# Patient Record
Sex: Male | Born: 1970 | Race: White | Hispanic: No | Marital: Married | Smoking: Former smoker
Health system: Southern US, Community
[De-identification: ages and names within clinical notes are randomized; demographics above are authoritative.]

## PROBLEM LIST (undated history)

## (undated) DIAGNOSIS — R519 Headache, unspecified: Secondary | ICD-10-CM

## (undated) DIAGNOSIS — E039 Hypothyroidism, unspecified: Secondary | ICD-10-CM

## (undated) DIAGNOSIS — F3181 Bipolar II disorder: Secondary | ICD-10-CM

## (undated) DIAGNOSIS — IMO0002 Reserved for concepts with insufficient information to code with codable children: Secondary | ICD-10-CM

## (undated) DIAGNOSIS — I10 Essential (primary) hypertension: Secondary | ICD-10-CM

## (undated) DIAGNOSIS — F32A Depression, unspecified: Secondary | ICD-10-CM

## (undated) DIAGNOSIS — F4310 Post-traumatic stress disorder, unspecified: Secondary | ICD-10-CM

## (undated) DIAGNOSIS — G8929 Other chronic pain: Secondary | ICD-10-CM

## (undated) DIAGNOSIS — G709 Myoneural disorder, unspecified: Secondary | ICD-10-CM

## (undated) DIAGNOSIS — E785 Hyperlipidemia, unspecified: Secondary | ICD-10-CM

## (undated) DIAGNOSIS — S82209A Unspecified fracture of shaft of unspecified tibia, initial encounter for closed fracture: Secondary | ICD-10-CM

## (undated) DIAGNOSIS — S02609A Fracture of mandible, unspecified, initial encounter for closed fracture: Secondary | ICD-10-CM

## (undated) DIAGNOSIS — T8859XA Other complications of anesthesia, initial encounter: Secondary | ICD-10-CM

## (undated) DIAGNOSIS — F419 Anxiety disorder, unspecified: Secondary | ICD-10-CM

## (undated) DIAGNOSIS — G894 Chronic pain syndrome: Secondary | ICD-10-CM

## (undated) DIAGNOSIS — M549 Dorsalgia, unspecified: Secondary | ICD-10-CM

## (undated) DIAGNOSIS — R4589 Other symptoms and signs involving emotional state: Secondary | ICD-10-CM

## (undated) DIAGNOSIS — F329 Major depressive disorder, single episode, unspecified: Secondary | ICD-10-CM

## (undated) HISTORY — DX: Reserved for concepts with insufficient information to code with codable children: IMO0002

## (undated) HISTORY — DX: Depression, unspecified: F32.A

## (undated) HISTORY — PX: MANDIBLE FRACTURE SURGERY: SHX706

## (undated) HISTORY — PX: BACK SURGERY: SHX140

## (undated) HISTORY — DX: Post-traumatic stress disorder, unspecified: F43.10

## (undated) HISTORY — DX: Dorsalgia, unspecified: M54.9

## (undated) HISTORY — DX: Essential (primary) hypertension: I10

## (undated) HISTORY — DX: Other chronic pain: G89.29

## (undated) HISTORY — DX: Anxiety disorder, unspecified: F41.9

## (undated) HISTORY — DX: Unspecified fracture of shaft of unspecified tibia, initial encounter for closed fracture: S82.209A

## (undated) HISTORY — DX: Major depressive disorder, single episode, unspecified: F32.9

## (undated) HISTORY — DX: Hyperlipidemia, unspecified: E78.5

## (undated) HISTORY — DX: Chronic pain syndrome: G89.4

## (undated) HISTORY — PX: APPENDECTOMY: SHX54

## (undated) HISTORY — DX: Hypothyroidism, unspecified: E03.9

## (undated) HISTORY — PX: FRACTURE SURGERY: SHX138

## (undated) HISTORY — DX: Fracture of mandible, unspecified, initial encounter for closed fracture: S02.609A

## (undated) HISTORY — PX: THORACIC DISCECTOMY: SHX100

## (undated) HISTORY — DX: Bipolar II disorder: F31.81

---

## 1998-01-31 ENCOUNTER — Ambulatory Visit (HOSPITAL_COMMUNITY): Admission: RE | Admit: 1998-01-31 | Discharge: 1998-01-31 | Payer: Self-pay | Admitting: *Deleted

## 1998-02-22 ENCOUNTER — Ambulatory Visit (HOSPITAL_COMMUNITY): Admission: RE | Admit: 1998-02-22 | Discharge: 1998-02-23 | Payer: Self-pay | Admitting: *Deleted

## 1998-03-08 ENCOUNTER — Ambulatory Visit (HOSPITAL_COMMUNITY): Admission: RE | Admit: 1998-03-08 | Discharge: 1998-03-08 | Payer: Self-pay | Admitting: *Deleted

## 1999-06-13 ENCOUNTER — Ambulatory Visit (HOSPITAL_COMMUNITY): Admission: RE | Admit: 1999-06-13 | Discharge: 1999-06-13 | Payer: Self-pay | Admitting: Neurosurgery

## 1999-06-13 ENCOUNTER — Encounter: Payer: Self-pay | Admitting: Neurosurgery

## 2000-03-17 ENCOUNTER — Inpatient Hospital Stay (HOSPITAL_COMMUNITY): Admission: EM | Admit: 2000-03-17 | Discharge: 2000-03-22 | Payer: Self-pay | Admitting: *Deleted

## 2000-03-25 ENCOUNTER — Other Ambulatory Visit: Admission: RE | Admit: 2000-03-25 | Discharge: 2000-04-02 | Payer: Self-pay

## 2000-03-30 ENCOUNTER — Encounter: Payer: Self-pay | Admitting: Emergency Medicine

## 2000-03-30 ENCOUNTER — Emergency Department (HOSPITAL_COMMUNITY): Admission: EM | Admit: 2000-03-30 | Discharge: 2000-03-30 | Payer: Self-pay | Admitting: Emergency Medicine

## 2000-06-17 ENCOUNTER — Encounter: Payer: Self-pay | Admitting: Emergency Medicine

## 2000-06-17 ENCOUNTER — Emergency Department (HOSPITAL_COMMUNITY): Admission: EM | Admit: 2000-06-17 | Discharge: 2000-06-17 | Payer: Self-pay | Admitting: Internal Medicine

## 2000-06-18 ENCOUNTER — Emergency Department (HOSPITAL_COMMUNITY): Admission: EM | Admit: 2000-06-18 | Discharge: 2000-06-18 | Payer: Self-pay | Admitting: Emergency Medicine

## 2000-06-22 ENCOUNTER — Emergency Department (HOSPITAL_COMMUNITY): Admission: EM | Admit: 2000-06-22 | Discharge: 2000-06-22 | Payer: Self-pay | Admitting: Emergency Medicine

## 2000-07-09 ENCOUNTER — Emergency Department (HOSPITAL_COMMUNITY): Admission: EM | Admit: 2000-07-09 | Discharge: 2000-07-09 | Payer: Self-pay | Admitting: Emergency Medicine

## 2000-07-10 ENCOUNTER — Inpatient Hospital Stay (HOSPITAL_COMMUNITY): Admission: EM | Admit: 2000-07-10 | Discharge: 2000-07-16 | Payer: Self-pay | Admitting: Psychiatry

## 2002-05-31 ENCOUNTER — Encounter: Payer: Self-pay | Admitting: Internal Medicine

## 2002-05-31 ENCOUNTER — Encounter: Admission: RE | Admit: 2002-05-31 | Discharge: 2002-05-31 | Payer: Self-pay | Admitting: Internal Medicine

## 2002-08-10 HISTORY — PX: LAMINECTOMY: SHX219

## 2002-08-26 ENCOUNTER — Emergency Department (HOSPITAL_COMMUNITY): Admission: EM | Admit: 2002-08-26 | Discharge: 2002-08-26 | Payer: Self-pay | Admitting: Emergency Medicine

## 2002-08-26 ENCOUNTER — Encounter: Payer: Self-pay | Admitting: Emergency Medicine

## 2002-11-11 ENCOUNTER — Encounter: Payer: Self-pay | Admitting: Emergency Medicine

## 2002-11-11 ENCOUNTER — Emergency Department (HOSPITAL_COMMUNITY): Admission: EM | Admit: 2002-11-11 | Discharge: 2002-11-11 | Payer: Self-pay | Admitting: Emergency Medicine

## 2003-01-23 ENCOUNTER — Encounter: Payer: Self-pay | Admitting: Internal Medicine

## 2003-01-23 ENCOUNTER — Encounter: Admission: RE | Admit: 2003-01-23 | Discharge: 2003-01-23 | Payer: Self-pay | Admitting: Internal Medicine

## 2003-03-27 ENCOUNTER — Inpatient Hospital Stay (HOSPITAL_COMMUNITY): Admission: RE | Admit: 2003-03-27 | Discharge: 2003-03-30 | Payer: Self-pay | Admitting: Neurosurgery

## 2003-03-27 ENCOUNTER — Encounter: Payer: Self-pay | Admitting: Neurosurgery

## 2003-06-11 ENCOUNTER — Emergency Department (HOSPITAL_COMMUNITY): Admission: EM | Admit: 2003-06-11 | Discharge: 2003-06-12 | Payer: Self-pay | Admitting: Emergency Medicine

## 2003-08-19 ENCOUNTER — Emergency Department (HOSPITAL_COMMUNITY): Admission: EM | Admit: 2003-08-19 | Discharge: 2003-08-19 | Payer: Self-pay | Admitting: Emergency Medicine

## 2003-12-08 ENCOUNTER — Emergency Department (HOSPITAL_COMMUNITY): Admission: EM | Admit: 2003-12-08 | Discharge: 2003-12-08 | Payer: Self-pay | Admitting: Emergency Medicine

## 2003-12-20 ENCOUNTER — Emergency Department (HOSPITAL_COMMUNITY): Admission: EM | Admit: 2003-12-20 | Discharge: 2003-12-20 | Payer: Self-pay | Admitting: Nurse Practitioner

## 2003-12-21 ENCOUNTER — Inpatient Hospital Stay (HOSPITAL_COMMUNITY): Admission: RE | Admit: 2003-12-21 | Discharge: 2003-12-26 | Payer: Self-pay | Admitting: Psychiatry

## 2004-03-19 ENCOUNTER — Encounter: Payer: Self-pay | Admitting: Internal Medicine

## 2004-06-09 ENCOUNTER — Emergency Department (HOSPITAL_COMMUNITY): Admission: EM | Admit: 2004-06-09 | Discharge: 2004-06-09 | Payer: Self-pay | Admitting: Emergency Medicine

## 2004-06-17 ENCOUNTER — Emergency Department (HOSPITAL_COMMUNITY): Admission: EM | Admit: 2004-06-17 | Discharge: 2004-06-17 | Payer: Self-pay | Admitting: Emergency Medicine

## 2004-06-20 ENCOUNTER — Ambulatory Visit: Payer: Self-pay | Admitting: Internal Medicine

## 2004-06-23 ENCOUNTER — Emergency Department (HOSPITAL_COMMUNITY): Admission: EM | Admit: 2004-06-23 | Discharge: 2004-06-24 | Payer: Self-pay | Admitting: Emergency Medicine

## 2004-06-26 ENCOUNTER — Ambulatory Visit: Payer: Self-pay | Admitting: Gastroenterology

## 2004-06-26 ENCOUNTER — Ambulatory Visit: Payer: Self-pay | Admitting: Internal Medicine

## 2004-06-26 ENCOUNTER — Inpatient Hospital Stay (HOSPITAL_COMMUNITY): Admission: AD | Admit: 2004-06-26 | Discharge: 2004-06-29 | Payer: Self-pay | Admitting: Internal Medicine

## 2004-06-27 ENCOUNTER — Ambulatory Visit: Payer: Self-pay | Admitting: Endocrinology

## 2004-06-27 ENCOUNTER — Ambulatory Visit: Payer: Self-pay | Admitting: Internal Medicine

## 2004-07-13 ENCOUNTER — Emergency Department (HOSPITAL_COMMUNITY): Admission: EM | Admit: 2004-07-13 | Discharge: 2004-07-13 | Payer: Self-pay | Admitting: Emergency Medicine

## 2004-07-14 ENCOUNTER — Ambulatory Visit: Payer: Self-pay | Admitting: Internal Medicine

## 2004-08-10 HISTORY — PX: UPPER GASTROINTESTINAL ENDOSCOPY: SHX188

## 2004-12-15 ENCOUNTER — Ambulatory Visit: Payer: Self-pay | Admitting: Internal Medicine

## 2005-06-05 ENCOUNTER — Ambulatory Visit: Payer: Self-pay | Admitting: Internal Medicine

## 2005-08-10 HISTORY — PX: INTRATHECAL PUMP IMPLANTATION: SHX1844

## 2005-12-12 ENCOUNTER — Ambulatory Visit: Payer: Self-pay | Admitting: Internal Medicine

## 2005-12-13 ENCOUNTER — Observation Stay (HOSPITAL_COMMUNITY): Admission: EM | Admit: 2005-12-13 | Discharge: 2005-12-14 | Payer: Self-pay | Admitting: Emergency Medicine

## 2005-12-23 ENCOUNTER — Ambulatory Visit: Payer: Self-pay | Admitting: Internal Medicine

## 2006-09-02 ENCOUNTER — Ambulatory Visit: Payer: Self-pay | Admitting: Internal Medicine

## 2006-10-19 ENCOUNTER — Ambulatory Visit: Payer: Self-pay | Admitting: Internal Medicine

## 2006-11-29 ENCOUNTER — Ambulatory Visit: Payer: Self-pay | Admitting: Internal Medicine

## 2006-11-29 LAB — CONVERTED CEMR LAB
ALT: 41 units/L — ABNORMAL HIGH (ref 0–40)
AST: 31 units/L (ref 0–37)
BUN: 7 mg/dL (ref 6–23)
Basophils Absolute: 0 10*3/uL (ref 0.0–0.1)
Basophils Relative: 0.3 % (ref 0.0–1.0)
CO2: 30 meq/L (ref 19–32)
Calcium: 9.3 mg/dL (ref 8.4–10.5)
Chloride: 106 meq/L (ref 96–112)
Creatinine, Ser: 1 mg/dL (ref 0.4–1.5)
Eosinophils Absolute: 0.3 10*3/uL (ref 0.0–0.6)
Eosinophils Relative: 3.9 % (ref 0.0–5.0)
GFR calc Af Amer: 110 mL/min
GFR calc non Af Amer: 91 mL/min
Glucose, Bld: 118 mg/dL — ABNORMAL HIGH (ref 70–99)
HCT: 41.2 % (ref 39.0–52.0)
Hemoglobin: 14.3 g/dL (ref 13.0–17.0)
Lymphocytes Relative: 29.2 % (ref 12.0–46.0)
MCHC: 34.6 g/dL (ref 30.0–36.0)
MCV: 92.8 fL (ref 78.0–100.0)
Monocytes Absolute: 0.2 10*3/uL (ref 0.2–0.7)
Monocytes Relative: 2.8 % — ABNORMAL LOW (ref 3.0–11.0)
Neutro Abs: 4.9 10*3/uL (ref 1.4–7.7)
Neutrophils Relative %: 63.8 % (ref 43.0–77.0)
Platelets: 351 10*3/uL (ref 150–400)
Potassium: 4.1 meq/L (ref 3.5–5.1)
RBC: 4.44 M/uL (ref 4.22–5.81)
RDW: 10.7 % — ABNORMAL LOW (ref 11.5–14.6)
Sodium: 142 meq/L (ref 135–145)
WBC: 7.6 10*3/uL (ref 4.5–10.5)

## 2007-02-08 ENCOUNTER — Telehealth (INDEPENDENT_AMBULATORY_CARE_PROVIDER_SITE_OTHER): Payer: Self-pay | Admitting: *Deleted

## 2007-03-17 ENCOUNTER — Ambulatory Visit: Payer: Self-pay | Admitting: Internal Medicine

## 2007-04-07 ENCOUNTER — Ambulatory Visit: Payer: Self-pay | Admitting: Internal Medicine

## 2007-04-07 DIAGNOSIS — E039 Hypothyroidism, unspecified: Secondary | ICD-10-CM | POA: Insufficient documentation

## 2007-04-07 DIAGNOSIS — G894 Chronic pain syndrome: Secondary | ICD-10-CM | POA: Insufficient documentation

## 2007-05-11 DIAGNOSIS — E039 Hypothyroidism, unspecified: Secondary | ICD-10-CM

## 2007-05-11 HISTORY — DX: Hypothyroidism, unspecified: E03.9

## 2007-06-21 ENCOUNTER — Ambulatory Visit: Payer: Self-pay | Admitting: Internal Medicine

## 2007-06-24 ENCOUNTER — Encounter (INDEPENDENT_AMBULATORY_CARE_PROVIDER_SITE_OTHER): Payer: Self-pay | Admitting: *Deleted

## 2007-06-24 LAB — CONVERTED CEMR LAB
ALT: 27 units/L (ref 0–53)
AST: 26 units/L (ref 0–37)
Albumin: 4.7 g/dL (ref 3.5–5.2)
Alkaline Phosphatase: 89 units/L (ref 39–117)
BUN: 5 mg/dL — ABNORMAL LOW (ref 6–23)
Basophils Absolute: 0.1 10*3/uL (ref 0.0–0.1)
Basophils Relative: 0.5 % (ref 0.0–1.0)
Bilirubin, Direct: 0.1 mg/dL (ref 0.0–0.3)
CO2: 28 meq/L (ref 19–32)
Calcium: 9.9 mg/dL (ref 8.4–10.5)
Chloride: 101 meq/L (ref 96–112)
Creatinine, Ser: 1.1 mg/dL (ref 0.4–1.5)
Eosinophils Absolute: 0.2 10*3/uL (ref 0.0–0.6)
Eosinophils Relative: 2.1 % (ref 0.0–5.0)
GFR calc Af Amer: 98 mL/min
GFR calc non Af Amer: 81 mL/min
Glucose, Bld: 102 mg/dL — ABNORMAL HIGH (ref 70–99)
HCT: 43.9 % (ref 39.0–52.0)
Hemoglobin: 15 g/dL (ref 13.0–17.0)
Lymphocytes Relative: 23 % (ref 12.0–46.0)
MCHC: 34.2 g/dL (ref 30.0–36.0)
MCV: 97.1 fL (ref 78.0–100.0)
Monocytes Absolute: 0.5 10*3/uL (ref 0.2–0.7)
Monocytes Relative: 4.4 % (ref 3.0–11.0)
Neutro Abs: 7.1 10*3/uL (ref 1.4–7.7)
Neutrophils Relative %: 70 % (ref 43.0–77.0)
Platelets: 297 10*3/uL (ref 150–400)
Potassium: 3.8 meq/L (ref 3.5–5.1)
RBC: 4.51 M/uL (ref 4.22–5.81)
RDW: 11 % — ABNORMAL LOW (ref 11.5–14.6)
Sodium: 138 meq/L (ref 135–145)
Total Bilirubin: 0.7 mg/dL (ref 0.3–1.2)
Total Protein: 7.8 g/dL (ref 6.0–8.3)
WBC: 10.3 10*3/uL (ref 4.5–10.5)

## 2007-10-17 ENCOUNTER — Telehealth (INDEPENDENT_AMBULATORY_CARE_PROVIDER_SITE_OTHER): Payer: Self-pay | Admitting: *Deleted

## 2007-11-02 ENCOUNTER — Ambulatory Visit: Payer: Self-pay | Admitting: Internal Medicine

## 2008-02-01 ENCOUNTER — Ambulatory Visit: Payer: Self-pay | Admitting: Internal Medicine

## 2008-05-24 ENCOUNTER — Telehealth (INDEPENDENT_AMBULATORY_CARE_PROVIDER_SITE_OTHER): Payer: Self-pay | Admitting: *Deleted

## 2008-06-19 ENCOUNTER — Telehealth (INDEPENDENT_AMBULATORY_CARE_PROVIDER_SITE_OTHER): Payer: Self-pay | Admitting: *Deleted

## 2009-01-29 ENCOUNTER — Emergency Department (HOSPITAL_COMMUNITY): Admission: EM | Admit: 2009-01-29 | Discharge: 2009-01-29 | Payer: Self-pay | Admitting: Emergency Medicine

## 2009-03-10 DIAGNOSIS — F3181 Bipolar II disorder: Secondary | ICD-10-CM

## 2009-03-10 HISTORY — DX: Bipolar II disorder: F31.81

## 2009-04-01 ENCOUNTER — Ambulatory Visit: Payer: Self-pay | Admitting: Internal Medicine

## 2009-04-01 DIAGNOSIS — R413 Other amnesia: Secondary | ICD-10-CM | POA: Insufficient documentation

## 2009-04-01 DIAGNOSIS — F319 Bipolar disorder, unspecified: Secondary | ICD-10-CM | POA: Insufficient documentation

## 2009-04-01 DIAGNOSIS — E785 Hyperlipidemia, unspecified: Secondary | ICD-10-CM | POA: Insufficient documentation

## 2009-04-01 DIAGNOSIS — R7309 Other abnormal glucose: Secondary | ICD-10-CM | POA: Insufficient documentation

## 2009-04-01 DIAGNOSIS — F411 Generalized anxiety disorder: Secondary | ICD-10-CM | POA: Insufficient documentation

## 2009-04-01 DIAGNOSIS — R9431 Abnormal electrocardiogram [ECG] [EKG]: Secondary | ICD-10-CM | POA: Insufficient documentation

## 2009-04-02 ENCOUNTER — Ambulatory Visit: Payer: Self-pay | Admitting: Internal Medicine

## 2009-04-05 ENCOUNTER — Encounter (INDEPENDENT_AMBULATORY_CARE_PROVIDER_SITE_OTHER): Payer: Self-pay | Admitting: *Deleted

## 2009-04-17 ENCOUNTER — Encounter (INDEPENDENT_AMBULATORY_CARE_PROVIDER_SITE_OTHER): Payer: Self-pay | Admitting: *Deleted

## 2009-04-29 ENCOUNTER — Ambulatory Visit: Payer: Self-pay | Admitting: Cardiology

## 2009-04-29 DIAGNOSIS — R0602 Shortness of breath: Secondary | ICD-10-CM | POA: Insufficient documentation

## 2009-05-14 ENCOUNTER — Encounter: Payer: Self-pay | Admitting: Cardiology

## 2009-05-14 ENCOUNTER — Ambulatory Visit: Payer: Self-pay

## 2009-05-14 ENCOUNTER — Ambulatory Visit: Payer: Self-pay | Admitting: Cardiology

## 2009-05-14 ENCOUNTER — Ambulatory Visit (HOSPITAL_COMMUNITY): Admission: RE | Admit: 2009-05-14 | Discharge: 2009-05-14 | Payer: Self-pay | Admitting: Cardiology

## 2009-06-14 ENCOUNTER — Ambulatory Visit: Payer: Self-pay | Admitting: Cardiology

## 2009-07-24 ENCOUNTER — Telehealth: Payer: Self-pay | Admitting: Cardiology

## 2009-07-30 ENCOUNTER — Telehealth: Payer: Self-pay | Admitting: Cardiology

## 2010-01-03 ENCOUNTER — Emergency Department (HOSPITAL_COMMUNITY): Admission: EM | Admit: 2010-01-03 | Discharge: 2010-01-04 | Payer: Self-pay | Admitting: Emergency Medicine

## 2010-04-05 ENCOUNTER — Emergency Department (HOSPITAL_COMMUNITY): Admission: EM | Admit: 2010-04-05 | Discharge: 2010-04-05 | Payer: Self-pay | Admitting: Emergency Medicine

## 2010-04-28 ENCOUNTER — Encounter: Payer: Self-pay | Admitting: Internal Medicine

## 2010-04-29 ENCOUNTER — Telehealth (INDEPENDENT_AMBULATORY_CARE_PROVIDER_SITE_OTHER): Payer: Self-pay | Admitting: *Deleted

## 2010-05-01 ENCOUNTER — Ambulatory Visit: Payer: Self-pay | Admitting: Internal Medicine

## 2010-05-01 DIAGNOSIS — R079 Chest pain, unspecified: Secondary | ICD-10-CM | POA: Insufficient documentation

## 2010-05-01 DIAGNOSIS — E559 Vitamin D deficiency, unspecified: Secondary | ICD-10-CM | POA: Insufficient documentation

## 2010-05-01 DIAGNOSIS — F17200 Nicotine dependence, unspecified, uncomplicated: Secondary | ICD-10-CM | POA: Insufficient documentation

## 2010-05-13 ENCOUNTER — Ambulatory Visit: Payer: Self-pay | Admitting: Internal Medicine

## 2010-09-07 LAB — CONVERTED CEMR LAB
Basophils Relative: 0 % (ref 0.0–3.0)
CO2: 33 meq/L — ABNORMAL HIGH (ref 19–32)
Calcium: 9 mg/dL (ref 8.4–10.5)
Direct LDL: 95.7 mg/dL
Eosinophils Absolute: 0.4 10*3/uL (ref 0.0–0.7)
Glucose, Bld: 93 mg/dL (ref 70–99)
Lithium Lvl: 0.4 meq/L — ABNORMAL LOW (ref 0.80–1.40)
Lymphs Abs: 3.4 10*3/uL (ref 0.7–4.0)
MCHC: 33.9 g/dL (ref 30.0–36.0)
MCV: 98.4 fL (ref 78.0–100.0)
Monocytes Absolute: 0.3 10*3/uL (ref 0.1–1.0)
Neutro Abs: 4.2 10*3/uL (ref 1.4–7.7)
Neutrophils Relative %: 50.9 % (ref 43.0–77.0)
RBC: 4.26 M/uL (ref 4.22–5.81)
Sodium: 143 meq/L (ref 135–145)

## 2010-09-09 NOTE — Progress Notes (Signed)
Summary: LAB RESULTS FROM ANOTHER OFFICE  Phone Note Call from Patient Call back at [phone] HEATHER=WIFE   Caller: Spouse Summary of Call: PATIENT PSYCHIATRIST'S  OFFICE DID BLOOD WORK AND IT SHOWED THAT HIS VIT D LEVEL IS LOW--THAT OFFICE WILL BE FAXING RESULTS TO [place]--  PATIENT'S WIFE CALLED--DIDNT KNOW WHETHER TO GET APPOINTMENT TO DISCUSS LOW VIT D LEVEL OR WAIT FOR DR HOPPER TO REVIEW LABS FROM OTHER OFFICE--ADVISED HER TO WAIT FOR REVIEW OF BLOOD WORK  [facility] [address], [place] Initial call taken by: Jerolyn Shin,  April 29, 2010 9:46 AM  Follow-up for Phone Call        This patient will need an OV to address labs order by another Doctor./Chrae [facility] CMA  April 29, 2010 9:58 AM   Additional Follow-up for Phone Call Additional follow up Details #1::        APPT IS FOR THURSDAY 9/22 Additional Follow-up by: Jerolyn Shin,  April 30, 2010 3:09 PM

## 2010-09-09 NOTE — Assessment & Plan Note (Signed)
Summary: LOW VIT D PER BLOOD WORK FROM PSYCHIATRIST OFFICE-ASKED ///SP[facility]   Vital Signs:  Patient profile:   40 year old male Weight:      186.8 pounds BMI:     31.20 Temp:     98.8 degrees F oral Pulse rate:   60 / minute Resp:     15 per minute BP sitting:   114 / 70  (left arm) Cuff size:   large  Vitals Entered By: Shonna Chock CMA (May 01, 2010 3:15 PM) CC: Follow-up visit: Discuss labs    Primary Care Provider:  Marga Melnick MD  CC:  Follow-up visit: Discuss labs .  History of Present Illness: Dental fragility noted 6 months ago; Dr Shea Evans noted thinning of maxilla requiring bone grafting. Dr Nolen Mu astutely documented vitamin D defifiency ; level was 21.4 04/28/2010. TSH  was 7.900; no doses missed .Synthroid has been taken with other meds.       He also  presents with Chest pain intermittently since 07/2009, but progressive in past few months.  The patient reports resting chest pain, but denies nausea, vomiting, diaphoresis, shortness of breath, palpitations, and indigestion.  The pain is described as intermittent and dull- aching.  The pain is located in the left lower chest.  The pain radiates to the back occasionally.  Episodes of chest pain last  hours.  The pain has no triggers or relievers. He smokes 3-4  cigarettes X 3-4 months, previously 1 ppd.  Allergies: 1)  ! Vancomycin 2)  ! Pcn 3)  ! * Neurotin 4)  * Remeron  Review of Systems General:  Denies weight loss; Weight up 20#. Eyes:  Complains of blurring; denies double vision and vision loss-both eyes. [employer]:  Complains of hoarseness; denies difficulty swallowing. Resp:  Complains of sputum productive; denies chest pain with inspiration, cough, and coughing up blood; Occasional  "black  sputum". GI:  Denies bloody stools, constipation, dark tarry stools, and diarrhea. Derm:  Denies changes in nail beds, dryness, and hair loss. Endo:  Complains of heat intolerance; denies cold intolerance.  Physical  Exam  General:  in no acute distress; cooperative throughout examination Eyes:  No corneal or conjunctival inflammation noted.  No icterus Neck:  No deformities, masses, or tenderness noted. Thyroid full Lungs:  Normal respiratory effort, chest expands symmetrically. Lungs are clear to auscultation, no crackles or wheezes. Heart:  regular rhythm, no murmur, no gallop, no rub, and bradycardia.   Abdomen:  Bowel sounds positive,abdomen soft and non-tender without masses, organomegaly or hernias noted.Pump subcutaneously R abdomen Neurologic:   oriented X3 and DTRs symmetrical and normal. No tremor  Skin:  Intact without suspicious lesions or rashes Cervical Nodes:  No lymphadenopathy noted Axillary Nodes:  No palpable lymphadenopathy Psych:  normally interactive, good eye contact, and not anxious appearing.     Impression & Recommendations:  Problem # 1:  VITAMIN D DEFICIENCY (ICD-268.9)  Problem # 2:  HYPOTHYROIDISM (ICD-244.9)  Problem # 3:  CHEST PAIN (ICD-786.50)  Orders: EKG w/ Interpretation (93000): SIGNIFICANT improvement in diffuse T wave inversions compared to 04/29/2009 T-2 View CXR (71020TC)  Problem # 4:  CIGARETTE SMOKER (ICD-305.1)  Orders: T-2 View CXR (71020TC)  Complete Medication List: 1)  Synthroid 75 Mcg Tabs (levothyroxine Sodium)  .Marland Kitchen.. 1 once daily 2)  Lithium Carbonate 300 Mg Tabs (Lithium carbonate) .Marland Kitchen.. 1 tab three times a day 3)  Lamictal 200 Mg Tabs (Lamotrigine) .... Takes 500mg  qd 4)  Hydromorphine Pump  5)  Propranolol  Hcl 20 Mg Tabs (Propranolol hcl) .... Take one tablet once daily 6)  Promethazine Hcl 25 Mg Supp (Promethazine hcl) .Marland Kitchen.. 1 by rectum as needed n&v 7)  Celexa 20 Mg Tabs (Citalopram hydrobromide) .Marland Kitchen.. 1 by mouth once daily 8)  Lovaza 1 Gm Caps (Omega-3-acid ethyl esters) .... Take 2 daily 9)  Temazepam-? Dose  .Marland KitchenMarland Kitchen. 1 by mouth as needed 10)  Valium 5 Mg Tabs (Diazepam) .Marland Kitchen.. 1 by mouth once daily as needed 11)  Maximum D3 10000 Unit  Caps (Cholecalciferol) .Marland Kitchen.. 1 pill m, w & fri  Other Orders: Admin 1st Vaccine (62952) Flu Vaccine 85yrs + (84132)  Patient Instructions: 1)  Please schedule a follow-up appointment in 9 weeks. 2)  TSH  (244.9) & vitamin D level (268.9) prior to visit. Prescriptions: MAXIMUM D3 10000 UNIT CAPS (CHOLECALCIFEROL) 1 pill M, W & Fri  #30 x 0   Entered and Authorized by:   Marga Melnick MD   Signed by:   Marga Melnick MD on 05/01/2010   Method used:   Print then Give to Patient   RxID:   [id number] SYNTHROID 75 MCG  TABS (LEVOTHYROXINE SODIUM) 1 once daily  #90 x 0   Entered and Authorized by:   Marga Melnick MD   Signed by:   Marga Melnick MD on 05/01/2010   Method used:   Print then Give to Patient   RxID:   [id number]  Flu Vaccine Consent Questions     Do you have a history of severe allergic reactions to this vaccine? no    Any prior history of allergic reactions to egg and/or gelatin? no    Do you have a sensitivity to the preservative Thimersol? no    Do you have a past history of Guillan-Barre Syndrome? no    Do you currently have an acute febrile illness? no    Have you ever had a severe reaction to latex? no    Vaccine information given and explained to patient? yes    Are you currently pregnant? no    Lot Number:[id number]   Exp Date:02/07/2011   Site Given  Left Deltoid IM  [phone] SYNTHROID 75 MCG  TABS (LEVOTHYROXINE SODIUM) 1 once daily  #90 x 0   Entered and Authorized by:   Marga Melnick MD   Signed by:   Marga Melnick MD on 05/01/2010   Method used:   Print then Give to Patient   RxID:   [id number]  .lbflu

## 2010-10-15 ENCOUNTER — Telehealth (INDEPENDENT_AMBULATORY_CARE_PROVIDER_SITE_OTHER): Payer: Self-pay | Admitting: *Deleted

## 2010-10-17 ENCOUNTER — Other Ambulatory Visit: Payer: Self-pay

## 2010-10-21 NOTE — Progress Notes (Signed)
Summary: two 90 day refills  Phone Note Refill Request Call back at Home Phone [phone] Message from:  Patient's wife on October 15, 2010 12:06 PM  Refills Requested: Medication #1:  SYNTHROID 75 MCG  TABS (LEVOTHYROXINE SODIUM) 1 once daily **LABS DUE NOW**  Medication #2:  VITAMIN D3 5000 UNIT CAPS TAKE 2 SOFTGELS EVERY MONDAY please call [phone] when prescriptions are ready for pickup---she mails them to her 90 day mail order facility  Initial call taken by: Jerolyn Shin,  October 15, 2010 12:42 PM  Follow-up for Phone Call        Left message on machine for patient to return call when avaliable, Reason for call:   Patient needs to schedule appointment for TSH & Vit D level, Per Dr.Hopper no 90 day supply until level checked Follow-up by: Shonna Chock CMA,  October 15, 2010 1:24 PM  Additional Follow-up for Phone Call Additional follow up Details #1::        pt came to pick up rx's. I told him he will have to have his labs done first. Pt didnt have time today to do them so he is coming back tomorrow to have labs done. Additional Follow-up by: Lavell Islam,  October 16, 2010 10:57 AM    Additional Follow-up for Phone Call Additional follow up Details #2::    Patient's wife called and indicate patient can have labs at her Job for no charge. Lab order placed at the front desk for pick-up./Chrae [facility] CMA  October 17, 2010 9:53 AM   New/Updated Medications: * LAB ORDER Non-fasting Labs:  Vit D level/TSH (268.9/244.9) Prescriptions: LAB ORDER Non-fasting Labs:  Vit D level/TSH (268.9/244.9)  #1 x 0   Entered by:   Shonna Chock CMA   Authorized by:   Marga Melnick MD   Signed by:   Shonna Chock CMA on 10/17/2010   Method used:   Print then Give to Patient   RxID:   [id number]

## 2010-10-27 LAB — POCT I-STAT, CHEM 8
BUN: 10 mg/dL (ref 6–23)
Calcium, Ion: 1.12 mmol/L (ref 1.12–1.32)
Chloride: 105 mEq/L (ref 96–112)
Glucose, Bld: 118 mg/dL — ABNORMAL HIGH (ref 70–99)
HCT: 46 % (ref 39.0–52.0)
Potassium: 4.3 mEq/L (ref 3.5–5.1)

## 2010-10-27 LAB — CBC
Hemoglobin: 15.2 g/dL (ref 13.0–17.0)
RBC: 4.66 MIL/uL (ref 4.22–5.81)
WBC: 8.6 10*3/uL (ref 4.0–10.5)

## 2010-10-27 LAB — DIFFERENTIAL
Lymphocytes Relative: 38 % (ref 12–46)
Monocytes Absolute: 0.4 10*3/uL (ref 0.1–1.0)
Monocytes Relative: 4 % (ref 3–12)
Neutro Abs: 4.7 10*3/uL (ref 1.7–7.7)
Neutrophils Relative %: 55 % (ref 43–77)

## 2010-11-18 ENCOUNTER — Telehealth: Payer: Self-pay

## 2010-11-18 NOTE — Telephone Encounter (Signed)
Dr.Hopper have you seen any labs from an outside source on this patient? If yes please advise if ok to give year supply of Synthroid

## 2010-11-18 NOTE — Telephone Encounter (Signed)
No I have not

## 2010-11-19 ENCOUNTER — Telehealth: Payer: Self-pay

## 2010-11-19 MED ORDER — LEVOTHYROXINE SODIUM 75 MCG PO TABS: 75.0000 ug | ORAL_TABLET | Freq: Every day | ORAL | Status: DC

## 2010-11-19 NOTE — Telephone Encounter (Signed)
He will need labs to assess his thyroid function( 244.9)

## 2010-11-19 NOTE — Telephone Encounter (Signed)
Patient called to say that he recently moved and that's why he didn't get labs done. Patient stated he will probably just get labs done here but he will call us back abouth 2ish to set up. Patient left his cell: [phone]

## 2010-11-19 NOTE — Telephone Encounter (Signed)
See other phone note, patient never had labs done

## 2010-11-19 NOTE — Telephone Encounter (Signed)
Patient called back and indicated he will come in tomorrow to get labs, Patient asked if Lithium level can be drawn as well,  per psych doctor. I ok'd and added Lithium level to lab orders (patient will bring hard copy of order from psych doctor). [facility] [place]

## 2010-11-20 ENCOUNTER — Other Ambulatory Visit: Payer: Self-pay

## 2010-12-24 ENCOUNTER — Encounter: Payer: Self-pay | Admitting: Internal Medicine

## 2010-12-24 ENCOUNTER — Telehealth: Payer: Self-pay | Admitting: Internal Medicine

## 2010-12-24 NOTE — Telephone Encounter (Signed)
Pt's wife [name] states that pt had his TSH checked at [facility] last week and the results were supposed to be sent over to Dr. Alwyn Ren. She also says it was normal. Pt is requesting for his Synth[facility] [place].Please advise.

## 2010-12-24 NOTE — Telephone Encounter (Signed)
Dr.Hopper have you seen lab results?

## 2010-12-24 NOTE — Telephone Encounter (Signed)
TSH was normal; PLEASE  verify last appt with me in [place](? 03/2009). I'll refill thyroid X 6 months , but  as per [facility] Medical Board standards  I cannot simply refill meds without actually seeing him @ least annually .

## 2010-12-25 MED ORDER — LEVOTHYROXINE SODIUM 75 MCG PO TABS: 75.0000 ug | ORAL_TABLET | Freq: Every day | ORAL | Status: DC

## 2010-12-25 NOTE — Telephone Encounter (Signed)
Last OV with Dr.Hopper was 05/01/2010 for f/u on labs done by psych Dr.   Patient's wife called again inquiring about refill, Maralyn Sago (scheduler) informed patient's wife rx to be sent in

## 2010-12-25 NOTE — Telephone Encounter (Signed)
Follow up would be necessary via September 2012

## 2010-12-26 NOTE — Discharge Summary (Signed)
NAME:  Cody [name], Cody [name]                 ACCOUNT NO.:  [id number]   MEDICAL RECORD NO.:  [id number]          PATIENT TYPE:  INP   LOCATION:  2028                         FACILITY:  [facility]   PHYSICIAN:  Sean A. Everardo All, M.D. [facility] OF BIRTH:  1971/07/19   DATE OF ADMISSION:  06/26/2004  DATE OF DISCHARGE:  06/29/2004                                 DISCHARGE SUMMARY   REASON FOR ADMISSION:  Nausea, vomiting, diarrhea.   HISTORY OF PRESENT ILLNESS:  Patient is a 40 year old admitted by Dr. Alwyn Ren  on June 26, 2004 with nausea, vomiting, diarrhea.  Please refer to Dr.  Frederik Pear dictated history and physical on that date for details.   HOSPITAL COURSE:  The patient was admitted and treated symptomatically.  He  underwent upper endoscopy by Dr. Arlyce Dice which was normal except for mild  gastritis.  This was felt to possibly be an incidental finding and perhaps  not related to his symptoms.   For his depression he was seen in consultation by psychiatry who recommended  increasing his Zoloft to 100 mg a day and this was done.   Patient has a chronic pain syndrome and states that he is between different  pain specialists.   By June 29, 2004 the patient was alert, oriented, ambulatory, eating his  usual diet and was thus discharged home.   DISCHARGE DIAGNOSES:  1.  Mild gastritis seen on endoscopy.  2.  Nausea, vomiting, and diarrhea probably not related to #1.  3.  Chronic pain syndrome.  4.  Anxiety and depression.  5.  Mild anemia, uncertain etiology.  Uncertain if related to above.   MEDICATIONS:  1.  Tranxene 15 mg b.i.d.  2.  Zoloft 100 mg daily.  3.  Zofran 4 mg orally dissolving q.4h. as needed for nausea.  4.  Dilaudid 4 mg q.4h. as needed for pain (#40 is prescribed at the time of      discharge).  He is advised that he will need to make arrangements to      have this refilled if he wishes to continue it.  I further told him that      Dr. Alwyn Ren may or may not recommend  continuation of this medication so      he should not necessarily have the expectation that Dr. Alwyn Ren will      refill it.  5.  Prilosec 20 mg daily.  Patient can go off this on a trial basis when he      is feeling better.  6.  Pepcid 20 mg b.i.d. for two weeks, then he can go off it.   FOLLOWUP:  Dr. Alwyn Ren in one to two weeks.   FOCUSED FOLLOWUP:  1.  He may be able to go off the Prilosec OTC.  2.  Work-up of the anemia is indicated if it is new.  3.  Dr. Andee Poles as previously scheduled.       SAE/MEDQ  D:  06/29/2004  T:  06/29/2004  Job:  [id number]
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
## 2010-12-26 NOTE — H&P (Signed)
NAME:  Cody [name], Cody [name] NO.:  [id number]   MEDICAL RECORD NO.:  [id number]          PATIENT TYPE:  EMS   LOCATION:  ED                           FACILITY:  [facility]   PHYSICIAN:  Wanda Plump, MD [facility]    DATE OF BIRTH:  [birth year]   DATE OF ADMISSION:  12/12/2005  DATE OF DISCHARGE:                                HISTORY & PHYSICAL   CHIEF COMPLAINT:  Mental status changes.   HISTORY OF THE PRESENT ILLNESS:  Cody [name] is a 40 year old white male who was  sent by his wife by EMS due to mental status changes.  I interview the  patient and he states he had a breakdown but cannot explain the episode  better.  I called the wife tonight but I was not able to contact her.  According to the ER note, they recently changed his medicine.  He seems to  be taking MS Contin routinely and Norco for breakthrough pain.   PAST MEDICAL HISTORY:  1.  Bipolar disorder.  2.  Osteoarthritis.  3.  Appendectomy.  4.  Back surgery.  5.  Jaw surgery.   FAMILY HISTORY:  Noncontributory at this time.   MEDICATIONS PER THE EMERGENCY ROOM NOTE:  1.  Promethazine.  2.  Clorazepate.  3.  Clonidine.  4.  MS Contin.  5.  Norco.   Doses are unknown.   SOCIAL HISTORY:  Does not smoke.  He admits to marijuana use and alcohol  use.   REVIEW OF SYSTEMS:  He states that physically he is okay except for some  nausea.  He made the statement that he took some Phenergan for the nausea  and that caused the mental status changes.  He denies any chest pain,  shortness of breath, abdominal pain or headache.  No suicidal or homicidal  ideation.  He admits that occasionally he hears voices which is a chronic  problem.   ALLERGIES:  1.  PENICILLIN.  2.  VANCOMYCIN.  3.  NEURONTIN.   PHYSICAL EXAMINATION:  GENERAL:  The patient is alert in no physical  distress.  He is cooperative.  He has a difficult time focusing on a single  thought.  He jumped subjects during the conversation.  VITAL SIGNS:   Afebrile, pulse 70, blood pressure 100/73, respirations 19.  LUNGS:  Clear to auscultation bilaterally.  CARDIOVASCULAR:  Regular rate and rhythm without murmur.  ABDOMEN:  Benign.  EXTREMITIES:  No edema.  NEUROLOGICAL EXAM:  Speech is slurred.  External ocular movements are  intact.  Pupils are symmetric.  Motor is normal except for decreased  strength on the right leg which is chronic according to the patient since  had the surgery.   LABORATORY AND X-RAYS:  CT of the head:  There is density by the cella that  needs followup later on.  Hemoglobin 13.5, white count 12.8, platelets 288.  Drug screen is only positive for opiates, benzodiazepines and cannabis.  Urinalysis is negative.  Potassium 3.4, creatinine 1.1, glucose 117.  LFTs  are normal.  Calcium is 8.6.   ASSESSMENT  AND PLAN:  1.  The patient is admitted to the hospital with mental status changes most      likely due to the combination of the medications that he is on as well      as drugs of recreational use such as marijuana.  We will try to contact      the family again in a few hours to get more details.  The patient is at      high risk of falls and seizures from possible withdrawal.  Consequently      he will be admitted to step-down unit and have Ativan p.r.n.  I am      planning to probably restart him on MS Contin once we talk with the      family.  2.  Abnormal CT of the head.  This will have to be followed up as an      outpatient.      Wanda Plump, MD [facility]  Electronically Signed     JEP/MEDQ  D:  12/13/2005  T:  12/13/2005  Job:  [id number]

## 2010-12-26 NOTE — Letter (Signed)
September 07, 2006    Andee Poles, M.D.  [address]  [place], [place] [address]   RE:  Uriostegui, FARREL  MRN:  [id number]  /  DOB:  May 02, 1971   Dear Rolly Pancake:   Marcial Pacas Ponds's lab studies do confirm hypothyroidism; I believe this has  no relationship to his lithium therapy.  I base this on the lab findings  themselves, his strong family history of hypothyroidism, and the low  therapeutic level of the lithium.   His TSH was repeated and again was high at 47.5.  His free T4 was 0.61  and free T3 2.5, both at the lowest limits of normal.  The free T3 and  free T4 would not be affected by other medical therapies as would be the  T3 resin uptake or non-free-T4.   Synthroid has been initiated and TSH will be monitored.  Goal will be a  TSH in the range of 1-3.   Thank you for referring him for evaluation.    Sincerely,      Titus Dubin. Alwyn Ren, MD,FACP,FCCP  Electronically Signed    [facility]/MedQ  DD: 09/07/2006  DT: 09/07/2006  Job #: [id number]

## 2011-03-17 ENCOUNTER — Ambulatory Visit (INDEPENDENT_AMBULATORY_CARE_PROVIDER_SITE_OTHER): Payer: 59 | Admitting: Internal Medicine

## 2011-03-17 ENCOUNTER — Encounter: Payer: Self-pay | Admitting: Internal Medicine

## 2011-03-17 DIAGNOSIS — Z0000 Encounter for general adult medical examination without abnormal findings: Secondary | ICD-10-CM

## 2011-03-17 NOTE — Progress Notes (Signed)
  Subjective:    Patient ID: Cody [name], male    DOB: 1970-09-09, 40 y.o.   MRN: [id number]  HPI No Show for scheduled CPX; he will be asked to transfer records    Review of Systems     Objective:   Physical Exam        Assessment & Plan:

## 2011-03-19 ENCOUNTER — Encounter: Payer: Self-pay | Admitting: Internal Medicine

## 2011-03-19 ENCOUNTER — Ambulatory Visit (INDEPENDENT_AMBULATORY_CARE_PROVIDER_SITE_OTHER): Payer: 59 | Admitting: Internal Medicine

## 2011-03-19 VITALS — BP 126/80 | HR 77 | Temp 99.3°F | Resp 12 | Ht 65.0 in | Wt 174.0 lb

## 2011-03-19 DIAGNOSIS — F329 Major depressive disorder, single episode, unspecified: Secondary | ICD-10-CM

## 2011-03-19 DIAGNOSIS — E559 Vitamin D deficiency, unspecified: Secondary | ICD-10-CM

## 2011-03-19 DIAGNOSIS — F17200 Nicotine dependence, unspecified, uncomplicated: Secondary | ICD-10-CM

## 2011-03-19 DIAGNOSIS — Z0000 Encounter for general adult medical examination without abnormal findings: Secondary | ICD-10-CM

## 2011-03-19 DIAGNOSIS — R9431 Abnormal electrocardiogram [ECG] [EKG]: Secondary | ICD-10-CM

## 2011-03-19 DIAGNOSIS — E039 Hypothyroidism, unspecified: Secondary | ICD-10-CM

## 2011-03-19 DIAGNOSIS — G894 Chronic pain syndrome: Secondary | ICD-10-CM

## 2011-03-19 DIAGNOSIS — E785 Hyperlipidemia, unspecified: Secondary | ICD-10-CM

## 2011-03-19 NOTE — Progress Notes (Signed)
Subjective:    Patient ID: Cody [name], male    DOB: [birth year], 40 y.o.   MRN: [id number]  HPI  Mr. Quinn  is here for a physical;acute issues include significant changes in weight. His weight has varied from 160-175 over last several months. This is in a context of intermittent anorexia.      Review of Systems Patient reports no  vision/ hearing changes,  fever ,adenopathy, persistant / recurrent hoarseness, swallowing issues, chest pain,palpitations, edema,persistant / recurrent cough, hemoptysis, paroxysmal nocturnal dyspnea, gastrointestinal  bleeding (melena, rectal bleeding), abdominal pain, excessive heart burn, GU symptoms( dysuria, hematuria, pyuria, voiding/incontinence  issues) syncope, focal weakness, numbness & tingling, skin/hair/nail changes, abnormal bruising/bleeding, or musculoskeletal symptoms/signs. He has had lack of ejaculation. He's also got decreased libido and generalized muscle weakness. Apparently pain pumps  are associated with low testosterone according to his pain specialist.He is now a regular exercise program; he has dyspnea with exertion.      Objective:   Physical Exam Gen.: Healthy and well-nourished in appearance. Alert, appropriate and cooperative throughout exam. Head: Normocephalic without obvious abnormalities;  no  alopecia  Eyes: No corneal or conjunctival inflammation noted. Pupils equal round reactive to light and accommodation. Fundal exam is benign without hemorrhages, exudate, papilledema. Extraocular motion intact. Vision grossly normal. Ears: External  ear exam reveals no significant lesions or deformities. Canals clear .TMs normal. Hearing is grossly normal bilaterally. Nose: External nasal exam reveals no deformity or inflammation. Nasal mucosa are pink and moist. No lesions or exudates noted. Septum minimally deviated Mouth: Oral mucosa and oropharynx reveal no lesions or exudates. Teeth in good repair. Neck: No deformities, masses, or  tenderness noted. Range of motion &. Thyroid normal. Lungs: Normal respiratory effort; chest expands symmetrically. Lungs are clear to auscultation without rales, wheezes, or increased work of breathing. Mild pectus excavatum. Heart: Normal rate and rhythm. Normal S1 and S2. No gallop, click, or rub. S4 w/o  murmur. Abdomen: Bowel sounds normal; abdomen soft and nontender. No organomegaly or hernias noted. Pain med pump R lateral abdomen. Genitalia/DRE: Genital exams essentially negative; there is no significant testicular atrophy. Prostate is normal in size and texture.                                                                           Musculoskeletal/extremities: No deformity or scoliosis noted of  the thoracic or lumbar spine. No clubbing, cyanosis, edema, or deformity noted. Range of motion  normal .Tone & strength  normal.Joints normal. Nail health  good. Vascular: Carotid, radial artery, dorsalis pedis and  posterior tibial pulses are full and equal. No bruits present. Neurologic: Alert and oriented x3. Deep tendon reflexes symmetrical and normal.          Skin: Intact without suspicious lesions or rashes. Lymph: No cervical, axillary, or inguinal lymphadenopathy present. Psych: Mood and affect are normal. Normally interactive  Assessment & Plan:  #1 comprehensive physical exam; no acute findings #2 see Problem List with Assessments & Recommendations. Note: Nonspecific T wave changes are stable on EKG.  #3 dramatic variation in weight with intermittent anorexia; most likely he would be related to medications  #4 decreased libido, generalized muscle weakness, and ejaculation dysfunction. Rule out  testosterone deficiency. Plan: see Orders

## 2011-03-19 NOTE — Patient Instructions (Addendum)
Preventive Health Care: Exercise at least 30-45 minutes a day,  3-4 days a week.  Take 6-8 weeks to reach this level Eat a low-fat diet with lots of fruits and vegetables, up to 7-9 servings per day. Avoid obesity; your goal is waist measurement < 40 inches.Consume less than 40 grams of sugar per day from foods & drinks with High Fructose Corn Sugar as # 1,2,3 or # 4 on label. Health Care Power of Attorney & Living Will. Complete if not in place ; these place you in charge of your health care decisions. Please think about quitting smoking. Review the risks we discussed. Please call 1-800-QUIT-NOW [phone]) for free smoking cessation counseling.

## 2011-04-04 ENCOUNTER — Encounter: Payer: Self-pay | Admitting: Internal Medicine

## 2011-04-04 DIAGNOSIS — R7989 Other specified abnormal findings of blood chemistry: Secondary | ICD-10-CM | POA: Insufficient documentation

## 2011-06-11 ENCOUNTER — Encounter: Payer: Self-pay | Admitting: Internal Medicine

## 2011-06-12 ENCOUNTER — Encounter: Payer: Self-pay | Admitting: Internal Medicine

## 2011-06-12 ENCOUNTER — Ambulatory Visit (INDEPENDENT_AMBULATORY_CARE_PROVIDER_SITE_OTHER): Payer: 59 | Admitting: Internal Medicine

## 2011-06-12 DIAGNOSIS — E781 Pure hyperglyceridemia: Secondary | ICD-10-CM

## 2011-06-12 DIAGNOSIS — R7989 Other specified abnormal findings of blood chemistry: Secondary | ICD-10-CM

## 2011-06-12 DIAGNOSIS — E291 Testicular hypofunction: Secondary | ICD-10-CM

## 2011-06-12 DIAGNOSIS — R6882 Decreased libido: Secondary | ICD-10-CM

## 2011-06-12 NOTE — Patient Instructions (Signed)
Eat a low-fat diet with lots of fruits and vegetables, up to 7-9 servings per day. Avoid obesity; your goal is waist measurement < 40 inches.Consume less than 40 grams of sugar per day from foods & drinks with High Fructose Corn Sugar as #1,2,3 or # 4 on label. Follow the low carb nutrition program in [employer] as closely as possible to prevent Diabetes . White carbohydrates (potatoes, rice, bread, and pasta) have a high spike of sugar and a high load of sugar. For example a  baked potato has a cup of sugar and a  french fry  2 teaspoons of sugar. Yams, wild  rice, whole grained bread &  wheat pasta have been much lower spike and load of  sugar. Portions should be the size of a deck of cards or your palm.  Please  schedule fasting Labs : free Testosterone,lipids, PSA. (Codes: Low serum testosterone level, elevated triglycerides & , 995.20, potential adverse drug effect) Please take  these instructions to that Lab appt.

## 2011-06-12 NOTE — Progress Notes (Signed)
  Subjective:    Patient ID: Cody [name], male    DOB: Jun 21, 1971, 40 y.o.   MRN: [id number]  HPI He is here to followup the lab abnormalities from [facility]  03/19/2011; triglycerides were 332 with VLDL of 66. His LDL was normal at 93. Serum testosterone was 52 with normals 348 - 1197. This study was requested by his pain specialist; he is concerned about possible interference from his pump. He describes profoundly decreased libido and severe weakness.  He has made significant changes in his diet, restricting carbs. He in just less than 40 g of sugar per day from foods and drinks with high fructose corn syrup on label.  Review of Systems     Objective:   Physical Exam   He appears well-nourished in no acute distress  The pain pump is palpable over the right abdomen. He has no organomegaly or masses otherwise.  Genital exam reveals normal-sized testicles and normal hair distribution.  Exam done in August of the prostate revealed no significant abnormalities        Assessment & Plan:  #1 testosterone deficiency on screening testosterone testing  #2 decreased libido  #3 muscle weakness  subjectively  Plan: I would recommend an early morning fasting free testosterone to confirm the results of the low screening study. Additionally prior to considering testosterone replacement, PSA should be performed. There is no family history of prostate cancer.

## 2011-07-09 ENCOUNTER — Other Ambulatory Visit: Payer: Self-pay | Admitting: Internal Medicine

## 2011-07-09 MED ORDER — LEVOTHYROXINE SODIUM 75 MCG PO TABS: 75.0000 ug | ORAL_TABLET | Freq: Every day | ORAL | Status: DC

## 2011-07-09 NOTE — Telephone Encounter (Signed)
RX sent

## 2011-07-20 ENCOUNTER — Other Ambulatory Visit: Payer: Self-pay | Admitting: Internal Medicine

## 2011-07-20 MED ORDER — LEVOTHYROXINE SODIUM 75 MCG PO TABS: 75.0000 ug | ORAL_TABLET | Freq: Every day | ORAL | Status: DC

## 2011-07-20 NOTE — Telephone Encounter (Signed)
[facility]

## 2011-07-31 ENCOUNTER — Other Ambulatory Visit: Payer: Self-pay | Admitting: Internal Medicine

## 2011-08-08 ENCOUNTER — Emergency Department: Payer: Self-pay | Admitting: Emergency Medicine

## 2011-08-31 ENCOUNTER — Telehealth: Payer: Self-pay | Admitting: Internal Medicine

## 2011-08-31 MED ORDER — OMEGA-3-ACID ETHYL ESTERS 1 G PO CAPS: 1.0000 g | ORAL_CAPSULE | Freq: Two times a day (BID) | ORAL | Status: DC

## 2011-08-31 NOTE — Telephone Encounter (Signed)
Patients wife is requesting refill of lovaza to be sent to [facility]

## 2011-08-31 NOTE — Telephone Encounter (Signed)
rx sent

## 2011-09-15 ENCOUNTER — Telehealth: Payer: Self-pay

## 2011-09-15 MED ORDER — AMBULATORY NON FORMULARY MEDICATION: Status: DC

## 2011-09-15 NOTE — Telephone Encounter (Signed)
Order faxed.

## 2011-09-15 NOTE — Telephone Encounter (Signed)
Message copied by Maurice Small on Tue Sep 15, 2011  4:49 PM ------      Message from: Legrand Como      Created: Tue Sep 15, 2011  4:32 PM       Mr. Mcerlean will be at [facility] 2-6, in the morning.  Could you please fax an order for labs/codes needed to:  [facility]      [phone],  Att. Phlebotomist.            Thanks

## 2011-09-20 ENCOUNTER — Encounter: Payer: Self-pay | Admitting: Internal Medicine

## 2011-09-25 ENCOUNTER — Encounter: Payer: Self-pay | Admitting: Internal Medicine

## 2011-10-05 ENCOUNTER — Ambulatory Visit (INDEPENDENT_AMBULATORY_CARE_PROVIDER_SITE_OTHER): Payer: 59 | Admitting: Internal Medicine

## 2011-10-05 ENCOUNTER — Encounter: Payer: Self-pay | Admitting: Internal Medicine

## 2011-10-05 DIAGNOSIS — R7989 Other specified abnormal findings of blood chemistry: Secondary | ICD-10-CM

## 2011-10-05 DIAGNOSIS — E291 Testicular hypofunction: Secondary | ICD-10-CM

## 2011-10-05 DIAGNOSIS — E785 Hyperlipidemia, unspecified: Secondary | ICD-10-CM

## 2011-10-05 MED ORDER — TESTOSTERONE 20.25 MG/1.25GM (1.62%) TD GEL: 20.2500 mg | TRANSDERMAL | Status: DC

## 2011-10-05 NOTE — Patient Instructions (Signed)
PLEASE BRING THESE INSTRUCTIONS TO FOLLOW UP  LAB APPOINTMENT in 6 months.This will guarantee correct labs are drawn, eliminating need for repeat blood sampling ( needle sticks ! ). Diagnoses /Codes:272.4; testosterone deficiency.  Testosterone level; lipids

## 2011-10-05 NOTE — Assessment & Plan Note (Signed)
Rxfor AndroGel

## 2011-10-05 NOTE — Progress Notes (Signed)
  Subjective:    Patient ID: Cody [name], male    DOB: 09/16/1970, 41 y.o.   MRN: [id number]  HPI He & his  wife have been following a low carb diet your program; there's been a dramatic improvement in his triglycerides. At one time they were 499; they're presently 113. He is exercising daily either walking a quarter mile or if there is inclement weather climbing the stairs at home. [facility] plans to [facility] [employer]  for [facility] as his back issues limit exercise options.  History testosterone level was low; free testosterone of 3.5 confirms testosterone deficiency. This is most likely related to the medications for chronic pain. His PSA is 1.2.  The need to control  lipids if he plans to take testosterone was discussed.    Review of Systems     Objective:   Physical Exam He appears healthy and well-nourished; he is in no acute distress  No carotid bruits are present.  Heart rhythm and rate are normal with no significant murmurs or gallops.  Chest is clear with no increased work of breathing  There is no evidence of aortic aneurysm or renal artery bruits  He has no clubbing or edema.   Pedal pulses are intact   No ischemic skin changes are present   He is animated and interactive.        Assessment & Plan:

## 2011-10-05 NOTE — Assessment & Plan Note (Signed)
Interventions to raise HDL or GOOD cholesterol include: exercising 30-45 minutes 3-4 X per week; including salmon & tuna in the diet;  & supplementing with Omega 3 fatty acids (Flax or Fish oil )  1-2 grams per day. The B vitamin Niacin also raises HDL but has a vasodilating effect which may cause flushing.   

## 2011-10-19 ENCOUNTER — Other Ambulatory Visit: Payer: Self-pay | Admitting: Internal Medicine

## 2011-10-19 DIAGNOSIS — R7989 Other specified abnormal findings of blood chemistry: Secondary | ICD-10-CM

## 2011-10-19 MED ORDER — TESTOSTERONE 20.25 MG/1.25GM (1.62%) TD GEL: 20.2500 mg | TRANSDERMAL | Status: DC

## 2011-10-19 NOTE — Telephone Encounter (Signed)
Patients wife (heather called) & states patient made a physical appearance regarding the prior authorization for   Andro-gel  Please do a PAR for the above & [facility] [address]  Please call heather with any issues ph#[phone]

## 2011-10-19 NOTE — Telephone Encounter (Signed)
I called and spoke with patient 's wife, we never received info from pharmacy to do a prior [place]. I verified pharmacy for [facility]. [facility] would like [facility].  [facility], then pharmacy will send [place] a prior [place]

## 2012-01-07 ENCOUNTER — Ambulatory Visit (INDEPENDENT_AMBULATORY_CARE_PROVIDER_SITE_OTHER): Payer: 59 | Admitting: Internal Medicine

## 2012-01-07 ENCOUNTER — Encounter: Payer: Self-pay | Admitting: Internal Medicine

## 2012-01-07 VITALS — BP 128/70 | HR 84 | Temp 98.3°F | Wt 172.0 lb

## 2012-01-07 DIAGNOSIS — M79602 Pain in left arm: Secondary | ICD-10-CM

## 2012-01-07 DIAGNOSIS — R634 Abnormal weight loss: Secondary | ICD-10-CM

## 2012-01-07 DIAGNOSIS — R109 Unspecified abdominal pain: Secondary | ICD-10-CM

## 2012-01-07 DIAGNOSIS — M79609 Pain in unspecified limb: Secondary | ICD-10-CM

## 2012-01-07 DIAGNOSIS — R195 Other fecal abnormalities: Secondary | ICD-10-CM

## 2012-01-07 DIAGNOSIS — I10 Essential (primary) hypertension: Secondary | ICD-10-CM

## 2012-01-07 NOTE — Progress Notes (Signed)
Subjective:    Patient ID: Cody [name], male    DOB: [birth year], 41 y.o.   MRN: [id number]  HPI Approximately 6 weeks ago he had profound elevation of his blood pressure following the [facility]. His blood pressure was high as 170/99. Extensive labs were checked by Dr. Nolen Mu and were normal. Specifically the lithium level was not elevated.  Off NSAIDS pressures returned to normal levels. He has no history of hypertension    Review of Systems  Blood pressure off NSAIDS: 113/69 on average  Chest pain: no but he has had some discomfort in the left forearm. He also noted a "lump" in the left antecubital area. This discomfort is described as an aching it occurs without specific trigger. Can occur at rest and can last 30-60 minutes. He has had an extensive cardiac evaluation because of nonspecific EKG changes  Dyspnea: no  Claudication: no  Lightheadedness: only with standing if he has taken Xanax  Urinary frequency: no   Edema: no   Exercise:walking < 1 mpd 2X/week  Diet Pattern: Sugar Busters  Salt Restriction: yes  He is also concerned because of some bowel changes. Several months ago he was seen in [facility] emergency room. He was having changes in his bowels mainly as loose stool. He also was passing "silver dollar sized flesh" from the rectum. He has had intermittent black stool. A CAT scan was done but no followup was pursued.  He has had a 10 pound weight loss in past year; on vacation he  has gained some weight back. Antispasmodics have not helped abdominal discomfort.  He has had a previous upper endoscopy  By Dr Arlyce Dice.  His maternal great uncle may have had colon cancer       Objective:   Physical Exam Gen.: Healthy and well-nourished in appearance. Alert, appropriate and cooperative throughout exam.   Eyes: No corneal or conjunctival inflammation noted. No icterus Mouth: Oral mucosa and  oropharynx reveal no lesions or exudates. Teeth in good repair. Neck: No deformities, masses, or tenderness noted. Thyroid normal Lungs: Normal respiratory effort; chest expands symmetrically. Lungs are clear to auscultation without rales, wheezes, or increased work of breathing. Heart: Normal rate and rhythm. Normal S1 and S2. No gallop, click, or rub. No murmur. Abdomen: Bowel sounds normal; abdomen soft and nontender. No masses, organomegaly or hernias noted. Subcutaneous pain medication dispenser present in the right abdomen                                                                                    Musculoskeletal/extremities:  No clubbing, cyanosis, edema, or deformity noted. Range of motion  normal .Tone & strength  normal.Joints normal. Nail health  good. The left forearm is 27.5 cm and the right 28 cm 6 cm below the antecubital crease. Vascular: Carotid, radial artery, dorsalis pedis and  posterior tibial pulses are full and equal. No bruits present. The left antecubital vein is prominent; there is no phlebolith but a prominent valve appears to be present Neurologic: Alert and oriented x3. Deep tendon reflexes symmetrical and normal.  Skin: Intact without suspicious lesions or rashes. Lymph: No cervical, axillary, or epitrochlear lymphadenopathy present. Psych: Mood and affect are normal. Normally interactive                                                                                         Assessment & Plan:  #1  #1 hypertensive flare in the context of nonsteroidals; resolved  #2 prominent venous structure left forearm; clinically no evidence of thrombosis or compartmental syndrome.  #3 abdominal pain with history of passage of tissue from the rectum; and weight loss; and possible melena  #4 diffuse nonspecific ST-T wave changes; no change compared to 03/19/11. Status post extensive cardiac evaluation  Plan: See orders and recommendations

## 2012-01-07 NOTE — Patient Instructions (Signed)
Please complete stool cards . Please fax CBC and differential results to [phone]

## 2012-01-08 ENCOUNTER — Encounter: Payer: Self-pay | Admitting: Internal Medicine

## 2012-01-12 ENCOUNTER — Ambulatory Visit: Payer: 59 | Admitting: Internal Medicine

## 2012-02-05 ENCOUNTER — Encounter: Payer: Self-pay | Admitting: Internal Medicine

## 2012-02-05 ENCOUNTER — Ambulatory Visit (INDEPENDENT_AMBULATORY_CARE_PROVIDER_SITE_OTHER): Payer: 59 | Admitting: Internal Medicine

## 2012-02-05 VITALS — BP 128/80 | HR 72 | Ht 66.0 in | Wt 173.0 lb

## 2012-02-05 DIAGNOSIS — R194 Change in bowel habit: Secondary | ICD-10-CM

## 2012-02-05 DIAGNOSIS — R198 Other specified symptoms and signs involving the digestive system and abdomen: Secondary | ICD-10-CM

## 2012-02-05 MED ORDER — POLYETHYLENE GLYCOL 3350 17 GM/SCOOP PO POWD: 17.0000 g | Freq: Two times a day (BID) | ORAL | Status: AC

## 2012-02-05 MED ORDER — MOVIPREP 100 G PO SOLR: ORAL | Status: DC

## 2012-02-05 NOTE — Progress Notes (Signed)
  Subjective:    Patient ID: Cody [name], male    DOB: [birth year], 41 y.o.   MRN: [id number] Referred by: Pecola Lawless, MD HPI This middle-aged white man presents with complaints of left lower quadrant pain, and a change in bowel habits over the last year. He did have some constipation issues related to narcotic use. However in the past year he did having color changes of the stool were they're very dark and what he thinks is pieces of tissue in the stool. He had a profound episode and went to an emergency room, and [place] where he had a CT scan lab workup that was unrevealing. This has persisted. He says it's not mucus. He does not have bleeding though the stools are sometimes orange in color. He has intermittent left lower quadrant pain after he eats. Sometimes severe. He does take Dulcolax and stool softeners and MiraLax on a daily basis. He moves his bowels every 3-4 days. 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 About a month ago that changed from dilated to morphine. His symptoms predate that changed however. He does note that he is using less breakthrough narcotic medication with the change in intrathecal medication. He thinks he should have less constipation and he thinks that things are somewhat worse and that he does not respond a laxatives like he used to. Medications, allergies, past medical history, past surgical history, family history and social history are reviewed and updated in the EMR.   Review of Systems This is positive for anxiety, chronic back pain, some lower extremity weakness in the right, depression headaches fatigue muscle abdominal cramps and sleeping problems. All other review of systems are negative or as per history of present illness.    Objective:   Physical Exam General:  Well-developed, well-nourished and in no acute distress Eyes:  anicteric. Lesniewski:   Mouth and posterior pharynx free of lesions.    Neck:   supple w/o thyromegaly or mass.  Lungs: Clear to auscultation bilaterally. Heart:  S1S2, no rubs, murmurs, gallops. Abdomen:  intrathecal pump in RLQ, mildly tender in LLQ, soft, no hepatosplenomegaly, hernia, or mass and BS+.  Rectal: Deferred until colonoscopy Lymph:  no cervical or supraclavicular adenopathy. Extremities:   no edema Skin   no rash. Neuro:  A&O x 3.  Psych:  appropriate mood and  Affect.   Data Reviewed: Dr. Frederik Pear recent notes.      Assessment & Plan:   1. Change in bowel habits    I'm not sure what this represents but he is a change in bowel habits and left lower quadrant pain problems. He certainly may have IBS problems. He may have chronic constipation issues related to narcotics. I'm not sure what the tissue in the stool were presents. Given all of this, I think a colonoscopy is reasonable to look for mucosal abnormality that could cause this. Once that is done we'll determine treatment plan. For the time being I have asked him to increase his MiraLax to 2 doses daily to see if his constipation improves.The risks and benefits as well as alternatives of endoscopic procedure(s) have been discussed and reviewed. All questions answered. The patient agrees to proceed.   I appreciate the opportunity to care for this patient.   CC: Marga Melnick, MD

## 2012-02-05 NOTE — Patient Instructions (Addendum)
Increase MiraLax to 2 doses daily.  You have been scheduled for a colonoscopy with propofol. Please follow written instructions given to you at your visit today.  Please pick up your prep kit at the pharmacy within the next 1-3 days.

## 2012-02-19 ENCOUNTER — Encounter: Payer: Self-pay | Admitting: Internal Medicine

## 2012-02-19 ENCOUNTER — Ambulatory Visit (AMBULATORY_SURGERY_CENTER): Payer: 59 | Admitting: Internal Medicine

## 2012-02-19 VITALS — BP 133/91 | HR 66 | Temp 98.1°F | Resp 16 | Ht 66.0 in | Wt 173.0 lb

## 2012-02-19 DIAGNOSIS — R198 Other specified symptoms and signs involving the digestive system and abdomen: Secondary | ICD-10-CM

## 2012-02-19 MED ORDER — SODIUM CHLORIDE 0.9 % IV SOLN: 500.0000 mL | INTRAVENOUS | Status: DC

## 2012-02-19 MED ORDER — LUBIPROSTONE 24 MCG PO CAPS: 24.0000 ug | ORAL_CAPSULE | Freq: Two times a day (BID) | ORAL | Status: DC

## 2012-02-19 NOTE — Patient Instructions (Addendum)
The colonoscopy was normal. I want you to try Amitiza 24 mcg twice a day with food to relieve constipation. A prescription was sent and I will also give you samples from the office.  If this is working continue it - if not then let me know.   Thank you for choosing me and [facility].  Iva Boop, MD, FACG    YOU HAD AN ENDOSCOPIC PROCEDURE TODAY AT THE [facility]: Refer to the procedure report that was given to you for any specific questions about what was found during the examination.  If the procedure report does not answer your questions, please call your gastroenterologist to clarify.  If you requested that your care partner not be given the details of your procedure findings, then the procedure report has been included in a sealed envelope for you to review at your convenience later.  YOU SHOULD EXPECT: Some feelings of bloating in the abdomen. Passage of more gas than usual.  Walking can help get rid of the air that was put into your GI tract during the procedure and reduce the bloating. If you had a lower endoscopy (such as a colonoscopy or flexible sigmoidoscopy) you may notice spotting of blood in your stool or on the toilet paper. If you underwent a bowel prep for your procedure, then you may not have a normal bowel movement for a few days.  DIET: Your first meal following the procedure should be a light meal and then it is ok to progress to your normal diet.  A half-sandwich or bowl of soup is an example of a good first meal.  Heavy or fried foods are harder to digest and may make you feel nauseous or bloated.  Likewise meals heavy in dairy and vegetables can cause extra gas to form and this can also increase the bloating.  Drink plenty of fluids but you should avoid alcoholic beverages for 24 hours.  ACTIVITY: Your care partner should take you home directly after the procedure.  You should plan to take it easy, moving slowly for the rest of the day.  You  can resume normal activity the day after the procedure however you should NOT DRIVE or use heavy machinery for 24 hours (because of the sedation medicines used during the test).    SYMPTOMS TO REPORT IMMEDIATELY: A gastroenterologist can be reached at any hour.  During normal business hours, 8:30 AM to 5:00 PM Monday through Friday, call [phone].  After hours and on weekends, please call the GI answering service at [phone] who will take a message and have the physician on call contact you.   Following lower endoscopy (colonoscopy or flexible sigmoidoscopy):  Excessive amounts of blood in the stool  Significant tenderness or worsening of abdominal pains  Swelling of the abdomen that is new, acute  Fever of 100F or higher FOLLOW UP: If any biopsies were taken you will be contacted by phone or by letter within the next 1-3 weeks.  Call your gastroenterologist if you have not heard about the biopsies in 3 weeks.  Our staff will call the home number listed on your records the next business day following your procedure to check on you and address any questions or concerns that you may have at that time regarding the information given to you following your procedure. This is a courtesy call and so if there is no answer at the home number and we have not heard from you through the emergency physician  on call, we will assume that you have returned to your regular daily activities without incident.  SIGNATURES/CONFIDENTIALITY: You and/or your care partner have signed paperwork which will be entered into your electronic medical record.  These signatures attest to the fact that that the information above on your After Visit Summary has been reviewed and is understood.  Full responsibility of the confidentiality of this discharge information lies with you and/or your care-partner.    Take Amitiza 40 mcg twice daily.   You also have a coupon in the bag.  Call us if you have any questions.

## 2012-02-19 NOTE — Progress Notes (Signed)
Patient did not have preoperative order for IV antibiotic SSI prophylaxis. (G8918)  Patient did not experience any of the following events: a burn prior to discharge; a fall within the facility; wrong site/side/patient/procedure/implant event; or a hospital transfer or hospital admission upon discharge from the facility. (G8907)  

## 2012-02-19 NOTE — Op Note (Signed)
[facility] [address] [place], [place]  [address]  COLONOSCOPY PROCEDURE REPORT  PATIENT:  Cody [name], Cody [name]  MR#:  [id number] BIRTHDATE:  1971-02-25, 41 yrs. old  GENDER:  male ENDOSCOPIST:  Iva Boop, MD, [facility] REF. BY:  Marga Melnick, M.D. PROCEDURE DATE:  02/19/2012 PROCEDURE:  Colonoscopy 62130 ASA CLASS:  Class III INDICATIONS:  change in bowel habits MEDICATIONS:   These medications were titrated to patient response per physician's verbal order, MAC sedation, administered by CRNA, propofol (Diprivan) 300 mg IV  DESCRIPTION OF PROCEDURE:   After the risks benefits and alternatives of the procedure were thoroughly explained, informed consent was obtained.  Digital rectal exam was performed and revealed no abnormalities.   The LB CF-[id number] endoscope was introduced through the anus and advanced to the cecum, which was identified by both the appendix and ileocecal valve, without limitations.  The quality of the prep was good, using MoviPrep. The instrument was then slowly withdrawn as the colon was fully examined. <<PROCEDUREIMAGES>>  FINDINGS:  A normal appearing cecum, ileocecal valve, and appendiceal orifice were identified. The ascending, hepatic flexure, transverse, splenic flexure, descending, sigmoid colon, and rectum appeared unremarkable.   Retroflexed views in the rectum revealed no abnormalities.    The time to cecum = 6:00 minutes. The scope was then withdrawn in 12:08 minutes from the cecum and the procedure completed. COMPLICATIONS:  None ENDOSCOPIC IMPRESSION: 1) Normal colonoscopy RECOMMENDATIONS: Try Amitiza 24 mcg bid with food REPEAT EXAM:  In 10 year(s) for routine screening colonoscopy.  Iva Boop, MD, [facility]  CC:  Pecola Lawless, MD and The Patient  n. eSIGNED:   Iva Boop at 02/19/2012 04:20 PM  Panebianco, Marcial Pacas, [phone]

## 2012-02-22 ENCOUNTER — Telehealth: Payer: Self-pay | Admitting: *Deleted

## 2012-02-22 NOTE — Telephone Encounter (Signed)
  Follow up Call-  Call back number 02/19/2012  Post procedure Call Back phone  # [phone]  Permission to leave phone message Yes     Patient questions:  Message left to call us if necessary.

## 2012-03-30 ENCOUNTER — Other Ambulatory Visit: Payer: Self-pay | Admitting: Internal Medicine

## 2012-05-16 ENCOUNTER — Other Ambulatory Visit: Payer: Self-pay

## 2012-05-16 MED ORDER — LEVOTHYROXINE SODIUM 75 MCG PO TABS: 75.0000 ug | ORAL_TABLET | Freq: Every day | ORAL | Status: DC

## 2012-05-16 NOTE — Telephone Encounter (Signed)
Refill # 30. Labs overdue.Please complete fasting Labs : BMET,Lipids, hepatic panel, TSH. Codes : 272.4, 244.9. OV 2-3 days after labs completed

## 2012-05-16 NOTE — Telephone Encounter (Signed)
Last OV 02/05/12. Last filled 07/20/11 # 90 2 refills. Need okay.       MW

## 2012-05-16 NOTE — Telephone Encounter (Signed)
Spoke to pt advised him Rx was approved for 30 days. Pt stated him and his wife would call back in and schedul labs and F/U appts.  Appts needed per [facility] before another refill.      MW

## 2012-05-16 NOTE — Telephone Encounter (Signed)
He may want to schedule fasting labs @ his wife's lab. He should see me once these results return.

## 2012-05-23 ENCOUNTER — Other Ambulatory Visit: Payer: Self-pay

## 2012-05-23 MED ORDER — LEVOTHYROXINE SODIUM 75 MCG PO TABS: 75.0000 ug | ORAL_TABLET | Freq: Every day | ORAL | Status: DC

## 2012-05-23 NOTE — Telephone Encounter (Signed)
Need okay. OV 12/21/11 Last filled 05/16/12 #30 no refills sent to mail order pharmacy but pt is out and need Rx ASAP.   Pt states need  A Rx sent to [facility]. Plz advise     MW

## 2012-05-23 NOTE — Telephone Encounter (Signed)
OK #30 , but TSH needed before next refill. Code: 244.9

## 2012-05-23 NOTE — Telephone Encounter (Signed)
TSH 244.9 

## 2012-06-01 ENCOUNTER — Other Ambulatory Visit: Payer: Self-pay

## 2012-06-01 NOTE — Telephone Encounter (Signed)
Diamond Nickel called from [facility] on behalf of pt for Rx levothyroxine. Diamond Nickel stated call [phone] reference# [id number].  I called them back and gave verbal order for 30 per [facility]. Rx done.      MW

## 2012-06-20 ENCOUNTER — Other Ambulatory Visit: Payer: Self-pay | Admitting: Internal Medicine

## 2012-06-21 NOTE — Telephone Encounter (Signed)
Rx sent.    MW 

## 2012-07-18 ENCOUNTER — Other Ambulatory Visit: Payer: Self-pay | Admitting: Internal Medicine

## 2012-07-18 NOTE — Telephone Encounter (Signed)
TSH 244.9 

## 2012-07-20 ENCOUNTER — Other Ambulatory Visit: Payer: Self-pay | Admitting: Internal Medicine

## 2012-08-30 ENCOUNTER — Other Ambulatory Visit: Payer: Self-pay | Admitting: *Deleted

## 2012-08-30 MED ORDER — OMEGA-3-ACID ETHYL ESTERS 1 G PO CAPS: ORAL_CAPSULE | ORAL | Status: DC

## 2012-08-30 NOTE — Telephone Encounter (Signed)
Lipid/hepatic Last labs done 03-19-11, Pending OV CPX 10-11-12

## 2012-08-30 NOTE — Telephone Encounter (Signed)
OK # 90 

## 2012-09-06 ENCOUNTER — Other Ambulatory Visit: Payer: Self-pay | Admitting: Internal Medicine

## 2012-09-20 ENCOUNTER — Other Ambulatory Visit: Payer: Self-pay | Admitting: *Deleted

## 2012-09-20 DIAGNOSIS — E7800 Pure hypercholesterolemia, unspecified: Secondary | ICD-10-CM

## 2012-09-20 MED ORDER — OMEGA-3-ACID ETHYL ESTERS 1 G PO CAPS: ORAL_CAPSULE | ORAL | Status: DC

## 2012-09-20 NOTE — Telephone Encounter (Signed)
Pts wife called stated that mail order pharmacy did not have refill request, resent.

## 2012-10-07 ENCOUNTER — Telehealth: Payer: Self-pay | Admitting: Internal Medicine

## 2012-10-07 NOTE — Telephone Encounter (Signed)
pts wife [name]) called again stated per [facility] they still have no RX from [place] on the Lov[place], this shows sent 1.28.14 & she also called on 2.11.14, with the same issue. Can you call the pharmacy please & then call heather back at [phone]

## 2012-10-11 ENCOUNTER — Encounter: Payer: Self-pay | Admitting: Internal Medicine

## 2012-10-11 ENCOUNTER — Ambulatory Visit (INDEPENDENT_AMBULATORY_CARE_PROVIDER_SITE_OTHER): Payer: 59 | Admitting: Internal Medicine

## 2012-10-11 VITALS — BP 128/74 | HR 75 | Temp 98.5°F | Resp 14 | Ht 65.0 in | Wt 176.0 lb

## 2012-10-11 DIAGNOSIS — Z0000 Encounter for general adult medical examination without abnormal findings: Secondary | ICD-10-CM

## 2012-10-11 LAB — CBC WITH DIFFERENTIAL/PLATELET
Basophils Relative: 0.3 % (ref 0.0–3.0)
Eosinophils Relative: 1.5 % (ref 0.0–5.0)
HCT: 42 % (ref 39.0–52.0)
Hemoglobin: 14.2 g/dL (ref 13.0–17.0)
Lymphs Abs: 3.7 10*3/uL (ref 0.7–4.0)
MCV: 94.5 fl (ref 78.0–100.0)
Monocytes Absolute: 0.5 10*3/uL (ref 0.1–1.0)
Monocytes Relative: 3.9 % (ref 3.0–12.0)
Platelets: 330 10*3/uL (ref 150.0–400.0)
RBC: 4.45 Mil/uL (ref 4.22–5.81)
WBC: 12.9 10*3/uL — ABNORMAL HIGH (ref 4.5–10.5)

## 2012-10-11 LAB — BASIC METABOLIC PANEL
BUN: 5 mg/dL — ABNORMAL LOW (ref 6–23)
Chloride: 104 mEq/L (ref 96–112)
GFR: 76.09 mL/min (ref >60.00)
Potassium: 3.9 mEq/L (ref 3.5–5.1)
Sodium: 139 mEq/L (ref 135–145)

## 2012-10-11 LAB — HEPATIC FUNCTION PANEL
ALT: 56 U/L — ABNORMAL HIGH (ref 0–53)
AST: 33 U/L (ref 0–37)
Total Bilirubin: 0.6 mg/dL (ref 0.3–1.2)
Total Protein: 7.8 g/dL (ref 6.0–8.3)

## 2012-10-11 LAB — LIPID PANEL
Cholesterol: 224 mg/dL — ABNORMAL HIGH (ref 0–200)
Total CHOL/HDL Ratio: 7

## 2012-10-11 LAB — TSH: TSH: 2.57 u[IU]/mL (ref 0.35–5.50)

## 2012-10-11 NOTE — Progress Notes (Signed)
  Subjective:    Patient ID: Cody [name], male    DOB: 09-25-1970, 42 y.o.   MRN: [id number]  HPI  Mr Opdahl is here for a physical; he denies acute issues.     Review of Systems He is on a low carb diet; he is not exercising . He denies chest pain, palpitations, dyspnea, or claudication. Family history is negative for premature coronary disease. Cholesterol testing has  revealed his major risk is hypertriglyceridemia.     Objective:   Physical Exam Gen.: Healthy and well-nourished in appearance. Alert, appropriate and cooperative throughout exam.Appears younger than stated age  Head: Normocephalic without obvious abnormalities; goatee; no alopecia  Eyes: No corneal or conjunctival inflammation noted.  Extraocular motion intact. Vision slightly decreased OD Ears: External  ear exam reveals no significant lesions or deformities. Canals clear .TMs normal. Hearing is grossly normal bilaterally. Nose: External nasal exam reveals no deformity or inflammation. Nasal mucosa are pink and moist. No lesions or exudates noted. Septum slightly to R  Mouth: Oral mucosa and oropharynx reveal no lesions or exudates. Teeth in good repair. Neck: No deformities, masses, or tenderness noted. Range of motion & Thyroid normal. Lungs: Normal respiratory effort; chest expands symmetrically. Lungs are clear to auscultation without rales, wheezes, or increased work of breathing. Heart: Normal rate and rhythm. Normal S1 and S2. No gallop, click, or rub. S4 with slight slurring; no murmur. Abdomen: Bowel sounds normal; abdomen soft and nontender. No masses, organomegaly or hernias noted. Genitalia: Genitalia normal except for minor left varices. Prostate is normal without enlargement, asymmetry, nodularity, or induration.                                    Musculoskeletal/extremities: No deformity or scoliosis noted of  the thoracic or lumbar spine.  No clubbing, cyanosis, edema, or significant extremity  deformity  noted. Range of motion normal .Tone normal ;strength decreased RLE Joints normal . Nail health good. Able to lie down & sit up w/o help. Negative SLR bilaterally Vascular: Carotid, radial artery, dorsalis pedis and  posterior tibial pulses are full and equal. No bruits present. Neurologic: Alert and oriented x3. Deep tendon reflexes symmetrical and normal.       Skin: Intact without suspicious lesions or rashes. Lymph: No cervical, axillary, or inguinal lymphadenopathy present. Psych: Mood and affect are normal. Normally interactive                                                                                        Assessment & Plan:  #1 comprehensive physical exam; no acute findings #2 abnormal ST-T changes on EKG , stable to iomproved  Plan: see Orders  & Recommendations

## 2012-10-11 NOTE — Patient Instructions (Addendum)
Preventive Health Care: Exercise at least 30-45 minutes a day,  3-4 days a week.  Eat a low-fat diet with lots of fruits and vegetables, up to 7-9 servings per day. This would eliminate the need for vitamin supplements. Consume less than 40 grams of sugar (preferably ZERO) per day from foods & drinks with High Fructose Corn Sugar as #1,2,3 or # 4 on label.  Take the EKG to any emergency room or preop visits. There are nonspecific changes; as long as there is no new change these are not clinically significant . If the old EKG is not available for comparison; it may result in unnecessary hospitalization for observation with significant unnecessary expense.  If you activate the  My Chart system; lab & Xray results will be released directly  to you as soon as I review & address these through the computer. As per [facility] all records must be reviewed and signed off within 72 hours; but I attempt to complete this within 36 hours unless I have no access to the electronic medical record.If I wait more than 36 hours the volume of reports becomes difficult to manage optimally. In my absence ;my partners would address the results.Critical lab results will be communicated to you ASAP. If you choose not to sign up for My Chart within 36 hours of labs being drawn; results will be reviewed & interpretation added before being copied & mailed, causing a delay in getting the results to you.If you do not receive that report within 7-10 days ,please call. Additionally you can use this system to gain direct  access to your records  if  out of town or @ an office of a  physician who is not in  the My Chart network.  This improves continuity of care & places you in control of your medical record.

## 2012-10-28 ENCOUNTER — Telehealth: Payer: Self-pay | Admitting: *Deleted

## 2012-10-28 MED ORDER — OMEGA-3-ACID ETHYL ESTERS 1 G PO CAPS: ORAL_CAPSULE | ORAL | Status: DC

## 2012-10-28 NOTE — Telephone Encounter (Signed)
I called [facility], New RX request for Lovaza, verbally given #180 with 1 additional refill

## 2012-10-28 NOTE — Telephone Encounter (Signed)
[facility] called [phone] Ref # [id number]) with questions concerning new Rx for Lovaza. Please return call.

## 2012-12-22 ENCOUNTER — Ambulatory Visit (INDEPENDENT_AMBULATORY_CARE_PROVIDER_SITE_OTHER): Payer: 59 | Admitting: Internal Medicine

## 2012-12-22 ENCOUNTER — Encounter: Payer: Self-pay | Admitting: Internal Medicine

## 2012-12-22 ENCOUNTER — Ambulatory Visit: Payer: 59 | Admitting: Internal Medicine

## 2012-12-22 VITALS — BP 118/70 | HR 64 | Temp 98.2°F | Wt 167.8 lb

## 2012-12-22 DIAGNOSIS — I776 Arteritis, unspecified: Secondary | ICD-10-CM

## 2012-12-22 DIAGNOSIS — R21 Rash and other nonspecific skin eruption: Secondary | ICD-10-CM

## 2012-12-22 MED ORDER — PREDNISONE 20 MG PO TABS: 20.0000 mg | ORAL_TABLET | Freq: Two times a day (BID) | ORAL | Status: DC

## 2012-12-22 MED ORDER — RANITIDINE HCL 150 MG PO TABS: 150.0000 mg | ORAL_TABLET | Freq: Two times a day (BID) | ORAL | Status: DC

## 2012-12-22 NOTE — Progress Notes (Signed)
  Subjective:    Patient ID: Cody [name], male    DOB: 08-07-1971, 42 y.o.   MRN: [id number]  HPI  Symptoms began 12/14/12 while he was in [place] as invisable, palpable small lumps over the thorax. This progressed to red dots diffusely over the trunk and extremities. Only the neck and face were spared.  These have been associated with some discomfort and tenderness to palpation.  He also initially had low-grade fever which resolved with Tylenol.  His wife developed strep throat while they were in [place].    Review of Systems  He denies pruritus, rhinitis, itchy/watery eyes, sneezing, wheezing, or shortness of breath. He's had no associated diarrhea.     Objective:   Physical Exam Gen.: Healthy and well-nourished in appearance. Alert, appropriate and cooperative throughout exam.  Eyes: No corneal or conjunctival inflammation noted. No conjunctival hemorrhages present Nose: External nasal exam reveals no deformity or inflammation. Nasal mucosa are pink and moist. No lesions or exudates noted. Mouth: Oral mucosa and oropharynx reveal no lesions or exudates. Teeth in good repair. No angioedema Neck: No deformities, masses, or tenderness noted.  Lungs: Normal respiratory effort; chest expands symmetrically. Lungs are clear to auscultation without rales, wheezes, or increased work of breathing. Heart: Normal rate and rhythm. Normal S1 and S2. No gallop, click, or rub. No murmur to suggest endocarditis Abdomen: Bowel sounds normal; abdomen soft and nontender. No masses, organomegaly or hernias noted.                       Musculoskeletal/extremities:  No clubbing, cyanosis, edema, or significant extremity  deformity noted. Joints normal . Nail health good; no nailbed hemorrhages Neurologic: Alert and oriented x3.         Skin: Myriad punctate papular lesions over the trunk and extremities which are slightly hyperemic. These do blanch with pressure and essentially disappear.  Dermatographia can be elicited Lymph: No cervical, axillary lymphadenopathy present. Psych: Mood and affect are normal. Normally interactive                                                                                        Assessment & Plan:  #1 rash, vasculitic. Clinically this is most likely related to something he ingested well in [place]. There is no evidence of endocarditis.  Plan: See orders and recommendations

## 2012-12-22 NOTE — Patient Instructions (Addendum)
  Plain Allegra (NOT D )  160 daily , Loratidine 10 mg , OR Zyrtec 10 mg @ bedtime  as H2 blocker.

## 2012-12-22 NOTE — Addendum Note (Signed)
Addended by: Silvio Pate D on: 12/22/2012 04:34 PM   Modules accepted: Orders

## 2013-01-25 ENCOUNTER — Telehealth: Payer: Self-pay | Admitting: Internal Medicine

## 2013-01-25 MED ORDER — LEVOTHYROXINE SODIUM 75 MCG PO TABS: ORAL_TABLET | ORAL | Status: DC

## 2013-01-25 NOTE — Telephone Encounter (Signed)
Patient called requesting new rx for levothyroxine be sent to [facility].

## 2013-06-27 ENCOUNTER — Other Ambulatory Visit: Payer: Self-pay | Admitting: Internal Medicine

## 2013-06-27 NOTE — Telephone Encounter (Signed)
Omega-3 refilled per protocol.

## 2013-09-12 DIAGNOSIS — M545 Low back pain, unspecified: Secondary | ICD-10-CM | POA: Diagnosis not present

## 2013-09-12 DIAGNOSIS — IMO0002 Reserved for concepts with insufficient information to code with codable children: Secondary | ICD-10-CM | POA: Diagnosis not present

## 2013-09-12 DIAGNOSIS — M5137 Other intervertebral disc degeneration, lumbosacral region: Secondary | ICD-10-CM | POA: Diagnosis not present

## 2013-09-12 DIAGNOSIS — M79609 Pain in unspecified limb: Secondary | ICD-10-CM | POA: Diagnosis not present

## 2013-10-17 ENCOUNTER — Other Ambulatory Visit: Payer: Self-pay | Admitting: Internal Medicine

## 2013-11-03 ENCOUNTER — Other Ambulatory Visit: Payer: Self-pay | Admitting: Internal Medicine

## 2013-11-03 NOTE — Telephone Encounter (Signed)
Rx sent to the pharmacy by e-script.  Pt needs complete physical and fasting labs.//AB/CMA 

## 2013-11-21 ENCOUNTER — Encounter: Payer: Self-pay | Admitting: Internal Medicine

## 2013-11-21 ENCOUNTER — Other Ambulatory Visit: Payer: Self-pay | Admitting: Internal Medicine

## 2013-11-21 ENCOUNTER — Other Ambulatory Visit: Payer: 59

## 2013-11-21 ENCOUNTER — Ambulatory Visit (INDEPENDENT_AMBULATORY_CARE_PROVIDER_SITE_OTHER): Payer: 59 | Admitting: Internal Medicine

## 2013-11-21 VITALS — BP 110/80 | HR 56 | Temp 98.8°F | Resp 15 | Wt 171.4 lb

## 2013-11-21 DIAGNOSIS — M7541 Impingement syndrome of right shoulder: Secondary | ICD-10-CM

## 2013-11-21 DIAGNOSIS — E291 Testicular hypofunction: Secondary | ICD-10-CM | POA: Diagnosis not present

## 2013-11-21 DIAGNOSIS — E039 Hypothyroidism, unspecified: Secondary | ICD-10-CM | POA: Diagnosis not present

## 2013-11-21 DIAGNOSIS — R350 Frequency of micturition: Secondary | ICD-10-CM

## 2013-11-21 DIAGNOSIS — M7580 Other shoulder lesions, unspecified shoulder: Secondary | ICD-10-CM

## 2013-11-21 DIAGNOSIS — M25819 Other specified joint disorders, unspecified shoulder: Secondary | ICD-10-CM | POA: Diagnosis not present

## 2013-11-21 DIAGNOSIS — IMO0001 Reserved for inherently not codable concepts without codable children: Secondary | ICD-10-CM

## 2013-11-21 DIAGNOSIS — R7989 Other specified abnormal findings of blood chemistry: Secondary | ICD-10-CM

## 2013-11-21 LAB — POCT URINALYSIS DIPSTICK
Glucose, UA: NEGATIVE
KETONES UA: NEGATIVE
Nitrite, UA: NEGATIVE
PH UA: 6.5
Protein, UA: NEGATIVE
SPEC GRAV UA: 1.01
Urobilinogen, UA: 0.2

## 2013-11-21 NOTE — Progress Notes (Signed)
Pre visit review using our clinic review tool, if applicable. No additional management support is needed unless otherwise documented below in the visit note. 

## 2013-11-21 NOTE — Progress Notes (Signed)
   Subjective:    Patient ID: Cody [name], male    DOB: 1971/04/29, 43 y.o.   MRN: [id number]  HPI He describes right shoulder pain; this began approximately 3 months ago one morning after sleeping in the right lateral decubitus position. There was no associated injury or repetitive motion activity trigger. Since that time he's been unable to raise the RUE more than 15 degrees.  Initially he has had erectile dysfunction. This is in the context of having had low testosterone levels in the context of chronic narcotic administration.He also has decreased libido and muscle weakness.      Review of Systems  He denies fever, chills, sweats, weight loss.  He has no numbness or tingling in the upper extremities.   He denies dysuria, pyuria, or hematuria.  He has frequent urination up to a dozen times a day. He also has nocturia several times at night. There is no associated incontinence of urine or stool.    Objective:   Physical Exam Gen.:  Adequately nourished in appearance. Alert, appropriate and cooperative throughout exam.   Head: Normocephalic without obvious abnormalities;no alopecia  Eyes: No corneal or conjunctival inflammation noted. No scleral icterus Neck: No deformities, masses, or tenderness noted. Range of motion decreased. Thyroid normal. Lungs: Normal respiratory effort; chest expands symmetrically. Lungs are clear to auscultation without rales, wheezes, or increased work of breathing. Heart: Normal rate and rhythm. Normal S1 and S2. No gallop, click, or rub. No murmur. Abdomen: Bowel sounds normal; abdomen soft and nontender. No masses, organomegaly or hernias noted. Genitalia: Genitalia normal except for left varices. Prostate is upper limits normal without definite enlargement, asymmetry, nodularity, or induration                               Musculoskeletal/extremities: No deformity or scoliosis noted of  the thoracic or lumbar spine.   No clubbing, cyanosis,  edema, or significant extremity  deformity noted. Range of motion only to 90 degrees RUE with maximal effort .Tone  Normal. Decreased strength RUE> LUE Hand joints normal  Fingernail health good. Vascular: Carotid, radial artery, dorsalis pedis and  posterior tibial pulses are full and equal. No bruits present. Neurologic: Alert and oriented x3. Deep tendon reflexes symmetrical and normal.  Gait normal  . Skin: Intact without suspicious lesions or rashes. Lymph: No cervical, axillary, or inguinal lymphadenopathy present. Psych: Mood and affect are normal. Normally interactive                                                                                        Assessment & Plan:  #1 R shoulder  impingement syndrome #2 decreased libido  #3 muscle weakness  #4 history of low testosterone  See orders

## 2013-11-21 NOTE — Patient Instructions (Signed)
Your next office appointment will be determined based upon review of your pending labs. Those instructions will be transmitted to you through My Chart . 

## 2013-11-23 LAB — URINE CULTURE
Colony Count: NO GROWTH
ORGANISM ID, BACTERIA: NO GROWTH

## 2013-11-24 DIAGNOSIS — Z79899 Other long term (current) drug therapy: Secondary | ICD-10-CM | POA: Diagnosis not present

## 2013-11-24 DIAGNOSIS — Z881 Allergy status to other antibiotic agents status: Secondary | ICD-10-CM | POA: Diagnosis not present

## 2013-11-24 DIAGNOSIS — F3189 Other bipolar disorder: Secondary | ICD-10-CM | POA: Diagnosis not present

## 2013-11-24 DIAGNOSIS — Z87891 Personal history of nicotine dependence: Secondary | ICD-10-CM | POA: Diagnosis not present

## 2013-11-24 DIAGNOSIS — M5137 Other intervertebral disc degeneration, lumbosacral region: Secondary | ICD-10-CM | POA: Diagnosis not present

## 2013-11-24 DIAGNOSIS — T85695A Other mechanical complication of other nervous system device, implant or graft, initial encounter: Secondary | ICD-10-CM | POA: Diagnosis not present

## 2013-11-24 DIAGNOSIS — M129 Arthropathy, unspecified: Secondary | ICD-10-CM | POA: Diagnosis not present

## 2013-11-24 DIAGNOSIS — G894 Chronic pain syndrome: Secondary | ICD-10-CM | POA: Diagnosis not present

## 2013-11-25 DIAGNOSIS — F3189 Other bipolar disorder: Secondary | ICD-10-CM | POA: Diagnosis not present

## 2013-11-25 DIAGNOSIS — Z87891 Personal history of nicotine dependence: Secondary | ICD-10-CM | POA: Diagnosis not present

## 2013-11-25 DIAGNOSIS — G894 Chronic pain syndrome: Secondary | ICD-10-CM | POA: Diagnosis not present

## 2013-11-25 DIAGNOSIS — M5137 Other intervertebral disc degeneration, lumbosacral region: Secondary | ICD-10-CM | POA: Diagnosis not present

## 2013-11-25 DIAGNOSIS — T85695A Other mechanical complication of other nervous system device, implant or graft, initial encounter: Secondary | ICD-10-CM | POA: Diagnosis not present

## 2013-11-25 DIAGNOSIS — Z881 Allergy status to other antibiotic agents status: Secondary | ICD-10-CM | POA: Diagnosis not present

## 2013-12-05 ENCOUNTER — Ambulatory Visit: Payer: 59 | Admitting: Family Medicine

## 2013-12-09 ENCOUNTER — Other Ambulatory Visit: Payer: Self-pay | Admitting: Internal Medicine

## 2013-12-19 ENCOUNTER — Other Ambulatory Visit: Payer: Self-pay | Admitting: Internal Medicine

## 2014-02-13 ENCOUNTER — Other Ambulatory Visit: Payer: Self-pay | Admitting: Internal Medicine

## 2014-03-19 DIAGNOSIS — M5380 Other specified dorsopathies, site unspecified: Secondary | ICD-10-CM | POA: Diagnosis not present

## 2014-03-19 DIAGNOSIS — M5137 Other intervertebral disc degeneration, lumbosacral region: Secondary | ICD-10-CM | POA: Diagnosis not present

## 2014-03-19 DIAGNOSIS — M51379 Other intervertebral disc degeneration, lumbosacral region without mention of lumbar back pain or lower extremity pain: Secondary | ICD-10-CM | POA: Diagnosis not present

## 2014-03-23 ENCOUNTER — Emergency Department: Payer: Self-pay | Admitting: Emergency Medicine

## 2014-03-23 LAB — COMPREHENSIVE METABOLIC PANEL
ALBUMIN: 4.2 g/dL (ref 3.4–5.0)
ANION GAP: 11 (ref 7–16)
Alkaline Phosphatase: 76 U/L
BUN: 14 mg/dL (ref 7–18)
Bilirubin,Total: 0.5 mg/dL (ref 0.2–1.0)
CHLORIDE: 103 mmol/L (ref 98–107)
CREATININE: 1.33 mg/dL — AB (ref 0.60–1.30)
Calcium, Total: 9.2 mg/dL (ref 8.5–10.1)
Co2: 28 mmol/L (ref 21–32)
EGFR (African American): >60
GLUCOSE: 166 mg/dL — AB (ref 65–99)
OSMOLALITY: 287 (ref 275–301)
POTASSIUM: 3.9 mmol/L (ref 3.5–5.1)
SGOT(AST): 60 U/L — ABNORMAL HIGH (ref 15–37)
SGPT (ALT): 84 U/L — ABNORMAL HIGH
SODIUM: 142 mmol/L (ref 136–145)
Total Protein: 8.3 g/dL — ABNORMAL HIGH (ref 6.4–8.2)

## 2014-03-23 LAB — CBC WITH DIFFERENTIAL/PLATELET
BASOS PCT: 1.1 %
Basophil #: 0.1 10*3/uL (ref 0.0–0.1)
Eosinophil #: 0.1 10*3/uL (ref 0.0–0.7)
Eosinophil %: 0.8 %
HCT: 46.4 % (ref 40.0–52.0)
HGB: 15.3 g/dL (ref 13.0–18.0)
LYMPHS ABS: 2.2 10*3/uL (ref 1.0–3.6)
LYMPHS PCT: 19.2 %
MCH: 31.3 pg (ref 26.0–34.0)
MCHC: 33 g/dL (ref 32.0–36.0)
MCV: 95 fL (ref 80–100)
Monocyte #: 0.3 x10 3/mm (ref 0.2–1.0)
Monocyte %: 2.6 %
NEUTROS ABS: 8.7 10*3/uL — AB (ref 1.4–6.5)
NEUTROS PCT: 76.3 %
Platelet: 371 10*3/uL (ref 150–440)
RBC: 4.89 10*6/uL (ref 4.40–5.90)
RDW: 12.8 % (ref 11.5–14.5)
WBC: 11.4 10*3/uL — AB (ref 3.8–10.6)

## 2014-03-23 LAB — URINALYSIS, COMPLETE
BACTERIA: NONE SEEN
BLOOD: NEGATIVE
Bilirubin,UR: NEGATIVE
GLUCOSE, UR: NEGATIVE mg/dL (ref 0–75)
Leukocyte Esterase: NEGATIVE
Nitrite: NEGATIVE
PROTEIN: NEGATIVE
Ph: 9 (ref 4.5–8.0)
RBC,UR: 1 /HPF (ref 0–5)
SPECIFIC GRAVITY: 1.009 (ref 1.003–1.030)

## 2014-03-23 LAB — LIPASE, BLOOD: Lipase: 130 U/L (ref 73–393)

## 2014-03-23 LAB — LITHIUM LEVEL: Lithium: 0.66 mmol/L

## 2014-04-10 ENCOUNTER — Other Ambulatory Visit: Payer: Self-pay | Admitting: Internal Medicine

## 2014-09-03 DIAGNOSIS — Z79899 Other long term (current) drug therapy: Secondary | ICD-10-CM | POA: Diagnosis not present

## 2014-09-03 DIAGNOSIS — G894 Chronic pain syndrome: Secondary | ICD-10-CM | POA: Diagnosis not present

## 2014-09-03 DIAGNOSIS — M546 Pain in thoracic spine: Secondary | ICD-10-CM | POA: Diagnosis not present

## 2014-09-03 DIAGNOSIS — G8929 Other chronic pain: Secondary | ICD-10-CM | POA: Diagnosis not present

## 2014-09-03 DIAGNOSIS — M545 Low back pain: Secondary | ICD-10-CM | POA: Diagnosis not present

## 2014-09-03 DIAGNOSIS — Z5181 Encounter for therapeutic drug level monitoring: Secondary | ICD-10-CM | POA: Diagnosis not present

## 2014-10-18 DIAGNOSIS — M5136 Other intervertebral disc degeneration, lumbar region: Secondary | ICD-10-CM | POA: Diagnosis not present

## 2014-10-18 DIAGNOSIS — G894 Chronic pain syndrome: Secondary | ICD-10-CM | POA: Diagnosis not present

## 2014-10-18 DIAGNOSIS — M545 Low back pain: Secondary | ICD-10-CM | POA: Diagnosis not present

## 2014-10-18 DIAGNOSIS — Z79899 Other long term (current) drug therapy: Secondary | ICD-10-CM | POA: Diagnosis not present

## 2014-10-18 DIAGNOSIS — M546 Pain in thoracic spine: Secondary | ICD-10-CM | POA: Diagnosis not present

## 2014-10-18 DIAGNOSIS — Z9689 Presence of other specified functional implants: Secondary | ICD-10-CM | POA: Diagnosis not present

## 2014-12-04 DIAGNOSIS — M546 Pain in thoracic spine: Secondary | ICD-10-CM | POA: Diagnosis not present

## 2014-12-04 DIAGNOSIS — Z79899 Other long term (current) drug therapy: Secondary | ICD-10-CM | POA: Diagnosis not present

## 2014-12-04 DIAGNOSIS — G894 Chronic pain syndrome: Secondary | ICD-10-CM | POA: Diagnosis not present

## 2014-12-04 DIAGNOSIS — M5417 Radiculopathy, lumbosacral region: Secondary | ICD-10-CM | POA: Diagnosis not present

## 2014-12-04 DIAGNOSIS — Z9689 Presence of other specified functional implants: Secondary | ICD-10-CM | POA: Diagnosis not present

## 2015-01-21 DIAGNOSIS — Z9689 Presence of other specified functional implants: Secondary | ICD-10-CM | POA: Diagnosis not present

## 2015-01-21 DIAGNOSIS — Z5181 Encounter for therapeutic drug level monitoring: Secondary | ICD-10-CM | POA: Diagnosis not present

## 2015-01-21 DIAGNOSIS — G894 Chronic pain syndrome: Secondary | ICD-10-CM | POA: Diagnosis not present

## 2015-01-21 DIAGNOSIS — M546 Pain in thoracic spine: Secondary | ICD-10-CM | POA: Diagnosis not present

## 2015-01-21 DIAGNOSIS — Z79899 Other long term (current) drug therapy: Secondary | ICD-10-CM | POA: Diagnosis not present

## 2015-01-21 DIAGNOSIS — M545 Low back pain: Secondary | ICD-10-CM | POA: Diagnosis not present

## 2015-02-20 ENCOUNTER — Other Ambulatory Visit: Payer: Self-pay | Admitting: Internal Medicine

## 2015-03-11 DIAGNOSIS — Z9689 Presence of other specified functional implants: Secondary | ICD-10-CM | POA: Diagnosis not present

## 2015-03-11 DIAGNOSIS — M545 Low back pain: Secondary | ICD-10-CM | POA: Diagnosis not present

## 2015-03-11 DIAGNOSIS — M546 Pain in thoracic spine: Secondary | ICD-10-CM | POA: Diagnosis not present

## 2015-04-08 ENCOUNTER — Other Ambulatory Visit: Payer: Self-pay | Admitting: Internal Medicine

## 2015-04-20 ENCOUNTER — Other Ambulatory Visit: Payer: Self-pay | Admitting: Internal Medicine

## 2015-04-26 DIAGNOSIS — M545 Low back pain: Secondary | ICD-10-CM | POA: Diagnosis not present

## 2015-04-26 DIAGNOSIS — Z9689 Presence of other specified functional implants: Secondary | ICD-10-CM | POA: Diagnosis not present

## 2015-04-26 DIAGNOSIS — M546 Pain in thoracic spine: Secondary | ICD-10-CM | POA: Diagnosis not present

## 2015-04-26 DIAGNOSIS — G894 Chronic pain syndrome: Secondary | ICD-10-CM | POA: Diagnosis not present

## 2015-05-27 ENCOUNTER — Emergency Department: Payer: 59

## 2015-05-27 ENCOUNTER — Emergency Department
Admission: EM | Admit: 2015-05-27 | Discharge: 2015-05-27 | Disposition: A | Discharge status: Home / Self Care | Payer: 59 | Attending: Emergency Medicine | Admitting: Emergency Medicine

## 2015-05-27 ENCOUNTER — Other Ambulatory Visit: Payer: Self-pay | Admitting: Internal Medicine

## 2015-05-27 ENCOUNTER — Encounter: Payer: Self-pay | Admitting: Emergency Medicine

## 2015-05-27 DIAGNOSIS — Z79899 Other long term (current) drug therapy: Secondary | ICD-10-CM | POA: Diagnosis not present

## 2015-05-27 DIAGNOSIS — Z880 Allergy status to penicillin: Secondary | ICD-10-CM | POA: Diagnosis not present

## 2015-05-27 DIAGNOSIS — F419 Anxiety disorder, unspecified: Secondary | ICD-10-CM | POA: Diagnosis not present

## 2015-05-27 DIAGNOSIS — M549 Dorsalgia, unspecified: Secondary | ICD-10-CM | POA: Diagnosis not present

## 2015-05-27 DIAGNOSIS — R1011 Right upper quadrant pain: Secondary | ICD-10-CM | POA: Diagnosis not present

## 2015-05-27 DIAGNOSIS — Z87891 Personal history of nicotine dependence: Secondary | ICD-10-CM | POA: Insufficient documentation

## 2015-05-27 DIAGNOSIS — R1013 Epigastric pain: Secondary | ICD-10-CM | POA: Diagnosis not present

## 2015-05-27 DIAGNOSIS — R109 Unspecified abdominal pain: Secondary | ICD-10-CM

## 2015-05-27 DIAGNOSIS — R0789 Other chest pain: Secondary | ICD-10-CM | POA: Diagnosis not present

## 2015-05-27 DIAGNOSIS — R110 Nausea: Secondary | ICD-10-CM | POA: Diagnosis not present

## 2015-05-27 DIAGNOSIS — G8929 Other chronic pain: Secondary | ICD-10-CM | POA: Diagnosis not present

## 2015-05-27 DIAGNOSIS — R112 Nausea with vomiting, unspecified: Secondary | ICD-10-CM | POA: Diagnosis not present

## 2015-05-27 DIAGNOSIS — R079 Chest pain, unspecified: Secondary | ICD-10-CM | POA: Diagnosis present

## 2015-05-27 DIAGNOSIS — R1084 Generalized abdominal pain: Secondary | ICD-10-CM | POA: Diagnosis not present

## 2015-05-27 LAB — BASIC METABOLIC PANEL
Anion gap: 8 (ref 5–15)
BUN: 10 mg/dL (ref 6–20)
CHLORIDE: 104 mmol/L (ref 101–111)
CO2: 28 mmol/L (ref 22–32)
CREATININE: 1.22 mg/dL (ref 0.61–1.24)
Calcium: 9.4 mg/dL (ref 8.9–10.3)
Glucose, Bld: 210 mg/dL — ABNORMAL HIGH (ref 65–99)
Potassium: 4 mmol/L (ref 3.5–5.1)
SODIUM: 140 mmol/L (ref 135–145)

## 2015-05-27 LAB — HEPATIC FUNCTION PANEL
ALBUMIN: 4.4 g/dL (ref 3.5–5.0)
ALT: 242 U/L — ABNORMAL HIGH (ref 17–63)
AST: 227 U/L — AB (ref 15–41)
Alkaline Phosphatase: 215 U/L — ABNORMAL HIGH (ref 38–126)
Bilirubin, Direct: 1.9 mg/dL — ABNORMAL HIGH (ref 0.1–0.5)
Indirect Bilirubin: 1.9 mg/dL — ABNORMAL HIGH (ref 0.3–0.9)
TOTAL PROTEIN: 7.4 g/dL (ref 6.5–8.1)
Total Bilirubin: 3.8 mg/dL — ABNORMAL HIGH (ref 0.3–1.2)

## 2015-05-27 LAB — CBC
HEMATOCRIT: 42.9 % (ref 40.0–52.0)
Hemoglobin: 14.4 g/dL (ref 13.0–18.0)
MCH: 31.7 pg (ref 26.0–34.0)
MCHC: 33.5 g/dL (ref 32.0–36.0)
MCV: 94.8 fL (ref 80.0–100.0)
PLATELETS: 265 10*3/uL (ref 150–440)
RBC: 4.53 MIL/uL (ref 4.40–5.90)
RDW: 12.2 % (ref 11.5–14.5)
WBC: 16.2 10*3/uL — AB (ref 3.8–10.6)

## 2015-05-27 LAB — TROPONIN I

## 2015-05-27 MED ORDER — IOHEXOL 240 MG/ML SOLN
25.0000 mL | Freq: Once | INTRAMUSCULAR | Status: DC | PRN
  Administered 2015-05-27: 25 mL via ORAL
  Filled 2015-05-27: qty 50

## 2015-05-27 MED ORDER — ONDANSETRON HCL 4 MG/2ML IJ SOLN
INTRAMUSCULAR | Status: AC
  Administered 2015-05-27: 4 mg via INTRAVENOUS
  Filled 2015-05-27: qty 2

## 2015-05-27 MED ORDER — IOHEXOL 300 MG/ML  SOLN
100.0000 mL | Freq: Once | INTRAMUSCULAR | Status: AC | PRN
  Administered 2015-05-27: 100 mL via INTRAVENOUS

## 2015-05-27 MED ORDER — MORPHINE SULFATE (PF) 4 MG/ML IV SOLN
4.0000 mg | Freq: Once | INTRAVENOUS | Status: AC
  Administered 2015-05-27: 4 mg via INTRAVENOUS

## 2015-05-27 MED ORDER — MORPHINE SULFATE (PF) 4 MG/ML IV SOLN
INTRAVENOUS | Status: AC
  Administered 2015-05-27: 4 mg via INTRAVENOUS
  Filled 2015-05-27: qty 1

## 2015-05-27 MED ORDER — ONDANSETRON HCL 4 MG/2ML IJ SOLN
4.0000 mg | Freq: Once | INTRAMUSCULAR | Status: AC
  Administered 2015-05-27: 4 mg via INTRAVENOUS

## 2015-05-27 MED ORDER — SODIUM CHLORIDE 0.9 % IV SOLN
1000.0000 mL | Freq: Once | INTRAVENOUS | Status: AC
  Administered 2015-05-27: 1000 mL via INTRAVENOUS

## 2015-05-27 NOTE — ED Provider Notes (Signed)
[facility] Emergency Department Provider Note  ____________________________________________  Time seen: On arrival  I have reviewed the triage vital signs and the nursing notes.   HISTORY  Chief Complaint Chest Pain    HPI Cody [name] is a 44 y.o. male who presents with epigastric pain. He reports the pain is sharp and 10 out of 10 and has had it on and off since Wednesday. He reports he has a morphine pain pump implanted in his abdomen. For chronic back pain. He reports mild nausea no vomiting. No diarrhea. No fevers no chills. Never had this pain before.     Past Medical History  Diagnosis Date  . Bipolar 2 disorder (HCC)   . Anxiety   . Hypothyroidism   . Hyperlipidemia   . Active smoker   . Chronic back pain   . 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 PTSD (post-traumatic stress disorder)     suicide attempt X1, gestureX 1  . Depression   . Hypertension     DENIES  . Degenerative disk disease     Patient Active Problem List   Diagnosis Date Noted  . Low serum testosterone level 04/04/2011  . VITAMIN D DEFICIENCY 05/01/2010  . CIGARETTE SMOKER 05/01/2010  . SHORTNESS OF BREATH 04/29/2009  . HYPERLIPIDEMIA 04/01/2009  . ANXIETY 04/01/2009  . DEPRESSION 04/01/2009  . MEMORY LOSS 04/01/2009  . FASTING HYPERGLYCEMIA 04/01/2009  . ABNORMAL ELECTROCARDIOGRAM 04/01/2009  . HYPOTHYROIDISM 04/07/2007  . SYNDROME, CHRONIC PAIN 04/07/2007    Past Surgical History  Procedure Laterality Date  . Appendectomy      peritonitis  . Mandible fracture surgery      mugged  . Laminectomy  2004    arterial injury, transfused 7 units pc, implant surgery   . Upper gastrointestinal endoscopy  2006    gastritis  . Intrathecal pump implantation  2007    Current Outpatient Rx  Name  Route  Sig  Dispense  Refill  . alprazolam (XANAX) 2 MG tablet   Oral   Take 2 mg by mouth at bedtime.         .  AMBULATORY NON FORMULARY MEDICATION      Morphine Pump Implant         . AMITIZA 24 MCG capsule      TAKE 1 CAPSULE BY MOUTH TWICE DAILY WITH A MEAL   60 capsule   0     Call and set up appointment, last seen 02/2012   . citalopram (CELEXA) 20 MG tablet   Oral   Take 20 mg by mouth daily.           . [employer] VIT D 5000 HIGH-POTENCY 5000 UNITS capsule      TAKE 2 SOFTGELS EVERY MONDAY,WEDNESDAY AND FRIDAY   60 capsule   5   . lamoTRIgine (LAMICTAL) 200 MG tablet   Oral   Take 200 mg by mouth daily.           Marland Kitchen. levothyroxine (SYNTHROID, LEVOTHROID) 75 MCG tablet   Oral   Take 1 tablet (75 mcg total) by mouth daily before breakfast. ---- NO FURTHER REFILL UNTIL OFFICE VISIT IS SCHEDULED.   90 tablet   0   . lithium 300 MG tablet   Oral   Take 300 mg by mouth. 2 by mouth am, 2 by mouth pm         . omega-3 acid  ethyl esters (LOVAZA) 1 G capsule      Take 1 capsule by mouth  twice daily   180 capsule   0     Pt will need office visit before further refills.   . propranolol (INDERAL) 20 MG tablet   Oral   Take 20 mg by mouth 3 (three) times daily.          . ranitidine (ZANTAC) 150 MG tablet   Oral   Take 1 tablet (150 mg total) by mouth 2 (two) times daily.   60 tablet   0     Allergies Neurontin; Vancomycin; Penicillins; and Mirtazapine  Family History  Problem Relation Age of Onset  . Stroke Father 33  . Thyroid disease Father     hypo  . Ulcers Mother   . Depression Mother   . Thyroid disease Mother     hypo  . Heart attack Maternal Grandfather 55  . Esophageal cancer Maternal Grandfather   . Lung cancer Maternal Grandmother   . Thyroid disease Maternal Grandmother     hypo  . Colon cancer Neg Hx   . Diabetes Neg Hx     Social History Social History  Substance Use Topics  . Smoking status: Former Smoker -- 0.25 packs/day    Quit date: 08/10/2009  . Smokeless tobacco: Never Used     Comment: smoked age intermittently age  20-18;[age]; up to 2 cigarettes/ day   . Alcohol Use: No    Review of Systems  Constitutional: Negative for fever. Eyes: Negative for visual changes. Jerome: Negative for sore throat Cardiovascular: Negative for chest pain. Respiratory: Negative for shortness of breath. Gastrointestinal: Positive for abdominal pain, nausea Genitourinary: Negative for dysuria. Musculoskeletal: Negative for back pain. Skin: Negative for rash. Neurological: Negative for headaches or focal weakness Psychiatric: Significant anxiety    ____________________________________________   PHYSICAL EXAM:  VITAL SIGNS: ED Triage Vitals  Enc Vitals Group     BP 05/27/15 0802 121/79 mmHg     Pulse Rate 05/27/15 0802 67     Resp 05/27/15 0802 18     Temp 05/27/15 0802 98.3 F (36.8 C)     Temp Source 05/27/15 0802 Oral     SpO2 05/27/15 0802 100 %     Weight 05/27/15 0801 159 lb (72.122 kg)     Height 05/27/15 0801  (1.651 m)     Head Cir --      Peak Flow --      Pain Score 05/27/15 0802 10     Pain Loc --      Pain Edu? --      Excl. in GC? --      Constitutional: Alert and oriented. Anxious and uncomfortable Eyes: Conjunctivae are normal.  [place]   Head: Normocephalic and atraumatic.   Mouth/Throat: Mucous membranes are moist. Cardiovascular: Normal rate, regular rhythm. Normal and symmetric distal pulses are present in all extremities. No murmurs, rubs, or gallops. Respiratory: Normal respiratory effort without tachypnea nor retractions. Breath sounds are clear and equal bilaterally.  Gastrointestinal: Mild tenderness to palpation in the right upper quadrant.. No distention. There is no CVA tenderness. Genitourinary: deferred Musculoskeletal: Nontender with normal range of motion in all extremities. No lower extremity tenderness nor edema. Neurologic:  Normal speech and language. No gross focal neurologic deficits are appreciated. Skin:  Skin is warm, dry and intact. No rash  noted. Psychiatric: Mood and affect are normal. Patient exhibits appropriate insight and judgment.  ____________________________________________  LABS (pertinent positives/negatives)  Labs Reviewed  BASIC METABOLIC PANEL - Abnormal; Notable for the following:    Glucose, Bld 210 (*)    All other components within normal limits  CBC - Abnormal; Notable for the following:    WBC 16.2 (*)    All other components within normal limits  TROPONIN I  HEPATIC FUNCTION PANEL    ____________________________________________   EKG  ED ECG REPORT I, Jene Every, the attending physician, personally viewed and interpreted this ECG.   Date: 05/27/2015  EKG Time: 8:01 AM  Rate: 70  Rhythm: normal sinus rhythm  Axis: Normal  Intervals:none  ST&T Change: Nonspecific T-wave changes   ____________________________________________    RADIOLOGY I have personally reviewed any xrays that were ordered on this patient: Ultrasound right upper quadrant unremarkable gallbladder CT scan unremarkable  ____________________________________________   PROCEDURES  Procedure(s) performed: none  Critical Care performed: none  ____________________________________________   INITIAL IMPRESSION / ASSESSMENT AND PLAN / ED COURSE  Pertinent labs & imaging results that were available during my care of the patient were reviewed by me and considered in my medical decision making (see chart for details).  After 2 doses of pain medication patient feels pain-free and is ready for discharge. His ultrasound and CT scan unremarkable. Patient is requesting morphine pills for his vacation to [place] which I denied. I asked the patient follow up with his PCP for further workup of his abdominal pain which is now resolved  ____________________________________________   FINAL CLINICAL IMPRESSION(S) / ED DIAGNOSES  Final diagnoses:  Abdominal pain     Jene Every, MD 05/27/15 1501

## 2015-05-27 NOTE — Discharge Instructions (Signed)

## 2015-05-27 NOTE — ED Notes (Signed)
Patient being discharged. Patient has received discharge paperwork. He has no questions at this time, wife is at bedside to drive him home.

## 2015-05-27 NOTE — ED Notes (Signed)
Pt presents with epigastric/chest pain started last Wednesday off and on. Pt states pain is sharp and is now constant with shortness of breath.

## 2015-06-03 ENCOUNTER — Other Ambulatory Visit: Payer: Self-pay | Admitting: Internal Medicine

## 2015-06-04 DIAGNOSIS — M5136 Other intervertebral disc degeneration, lumbar region: Secondary | ICD-10-CM | POA: Diagnosis not present

## 2015-06-04 DIAGNOSIS — M5417 Radiculopathy, lumbosacral region: Secondary | ICD-10-CM | POA: Diagnosis not present

## 2015-06-04 DIAGNOSIS — M546 Pain in thoracic spine: Secondary | ICD-10-CM | POA: Diagnosis not present

## 2015-06-04 DIAGNOSIS — M545 Low back pain: Secondary | ICD-10-CM | POA: Diagnosis not present

## 2015-06-04 DIAGNOSIS — G894 Chronic pain syndrome: Secondary | ICD-10-CM | POA: Diagnosis not present

## 2015-06-05 ENCOUNTER — Encounter: Payer: Self-pay | Admitting: Internal Medicine

## 2015-06-05 ENCOUNTER — Ambulatory Visit (INDEPENDENT_AMBULATORY_CARE_PROVIDER_SITE_OTHER): Payer: 59 | Admitting: Internal Medicine

## 2015-06-05 VITALS — BP 104/70 | HR 76 | Temp 98.1°F | Resp 12 | Ht 65.0 in | Wt 158.0 lb

## 2015-06-05 DIAGNOSIS — R748 Abnormal levels of other serum enzymes: Secondary | ICD-10-CM

## 2015-06-05 DIAGNOSIS — D72829 Elevated white blood cell count, unspecified: Secondary | ICD-10-CM

## 2015-06-05 DIAGNOSIS — R1013 Epigastric pain: Secondary | ICD-10-CM | POA: Diagnosis not present

## 2015-06-05 DIAGNOSIS — R7309 Other abnormal glucose: Secondary | ICD-10-CM

## 2015-06-05 NOTE — Progress Notes (Signed)
Pre visit review using our clinic review tool, if applicable. No additional management support is needed unless otherwise documented below in the visit note. 

## 2015-06-05 NOTE — Patient Instructions (Signed)
Fasting labs are recommended; specifically CBC and differential,lipase, hepatic panel, and A1c. If the labs remain abnormal or symptoms recur; GI referral is definitely indicated.

## 2015-06-05 NOTE — Progress Notes (Signed)
   Subjective:    Patient ID: Cody [name], male    DOB: [birth year], 44 y.o.   MRN: [id number]  HPI He was awakened approximately 10 PM on 05/26/15 with epigastric pain and chest tightness. Progression in severity prompted him to go the emergency room. His concern was myocardial infarction. In the emergency room troponin was negative. RUQ US;CT of the abdomen and pelvis were nondiagnostic. Significant abnormalities included AST of 227 and ALT at 242. His glucose was 210. White count was elevated at 16,200. He was unaware of these abnormalities.  He had experienced  a milder episode over 3-4 hours on 10/9.  He also describes significant indigestion on 10/19.  Since discharge he denies pain but he did have vomiting & dry heaves over 6 hours 10/24. This was treated with leftover Phenergan. He has decreased appetite. He's lost 10 pounds in the last 2 weeks. He has associated fatigue. Symptoms are dramatically better on a bland diet.  He had been on Amitiza previously for constipation. He finds Dulcolax works better.Marland Kitchen. He denies intake of nonsteroidals, alcohol, tobacco, or caffeine. He had an upper endoscopy in 2006.  He has chronic pain for which he is on narcotics via a morphine pump..    Review of Systems  He has no fever or chills.T There is no significant cough, sputum production,hemoptysis, wheezing,or  paroxysmal nocturnal dyspnea. Unexplained weight loss, abdominal pain, significant dyspepsia, dysphagia, melena, rectal bleeding, or persistently small caliber stools are not present at this time. He denies clay colored stool or Coca-Cola colored urine. Dysuria, pyuria, hematuria, frequency, nocturia or polyuria are denied. Objective:   Physical Exam  Pertinent or positive findings include: he has a beard and mustache. Nasal septum is deviated to the right. Lips are chapped. He is slightly tender in  the LEFT lower quadrant. The morphine pump is in the subcutaneous tissues of the right  abdomen. He has slight crepitus of the knees.  General appearance :adequately nourished; in no distress.  Eyes: No conjunctival inflammation or scleral icterus is present.  Oral exam:   gums are healthy appearing.There is no oropharyngeal erythema or exudate noted. Dental hygiene is good.  Heart:  Normal rate and regular rhythm. S1 and S2 normal without gallop, murmur, click, rub or other extra sounds    Lungs:Chest clear to auscultation; no wheezes, rhonchi,rales ,or rubs present.No increased work of breathing.   Abdomen: bowel sounds normal, soft  without masses, organomegaly or hernias noted.  No guarding or rebound. No flank tenderness to percussion.  Vascular : all pulses equal ; no bruits present.  Skin:Warm & dry.  Intact without suspicious lesions or rashes ; no tenting or jaundice   Lymphatic: No lymphadenopathy is noted about the head, neck, axilla.   Neuro: Strength, tone decreased; DTRs normal.     Assessment & Plan:   #1 abdominal pain/chest pain with negative troponin and negative US & CT of the abdomen and pelvis.  #2 dramatic elevation of liver enzymes  #3 marked hyperglycemia    #4 leukocytosis  See orders and recommendations

## 2015-06-07 ENCOUNTER — Telehealth: Payer: Self-pay | Admitting: Internal Medicine

## 2015-06-07 DIAGNOSIS — R739 Hyperglycemia, unspecified: Secondary | ICD-10-CM

## 2015-06-07 DIAGNOSIS — R109 Unspecified abdominal pain: Secondary | ICD-10-CM

## 2015-06-07 NOTE — Telephone Encounter (Signed)
States [facility] will not accept encounter with written instructions for lab orders.  States [facility] is requesting the orders to be written on a scripts.  Please follow back up.   Is requesting a faxed script to be sent over at [phone].

## 2015-06-07 NOTE — Telephone Encounter (Signed)
Patient states he is going to [place] and will not be back until the 8th.  States he will go on the 9th to get labs done.  Would like to make sure is faxed over and everything is ready to go with [facility] when he goes on the 9th.

## 2015-06-08 NOTE — Telephone Encounter (Signed)
I can print a requisition to do labs at [facility].   Can you call pt and ask if that is okay?

## 2015-06-10 NOTE — Telephone Encounter (Signed)
Patient states it had to be on written script.

## 2015-06-12 NOTE — Telephone Encounter (Signed)
These were listed in AVS last visit

## 2015-06-12 NOTE — Telephone Encounter (Signed)
Please advise what labs you would like for pt to do.

## 2015-06-17 NOTE — Telephone Encounter (Signed)
Lab orders are entered and faxed to [facility].   Will you contact pt and let him know the same please.

## 2015-06-18 NOTE — Telephone Encounter (Signed)
Called and advised.

## 2015-07-01 ENCOUNTER — Other Ambulatory Visit: Payer: Self-pay | Admitting: Internal Medicine

## 2015-07-22 DIAGNOSIS — M545 Low back pain: Secondary | ICD-10-CM | POA: Diagnosis not present

## 2015-07-22 DIAGNOSIS — G894 Chronic pain syndrome: Secondary | ICD-10-CM | POA: Diagnosis not present

## 2015-07-22 DIAGNOSIS — Z79899 Other long term (current) drug therapy: Secondary | ICD-10-CM | POA: Diagnosis not present

## 2015-07-22 DIAGNOSIS — M5136 Other intervertebral disc degeneration, lumbar region: Secondary | ICD-10-CM | POA: Diagnosis not present

## 2015-07-22 DIAGNOSIS — G8929 Other chronic pain: Secondary | ICD-10-CM | POA: Diagnosis not present

## 2015-07-22 DIAGNOSIS — Z5181 Encounter for therapeutic drug level monitoring: Secondary | ICD-10-CM | POA: Diagnosis not present

## 2015-08-01 ENCOUNTER — Other Ambulatory Visit: Payer: Self-pay | Admitting: Internal Medicine

## 2015-08-19 ENCOUNTER — Other Ambulatory Visit: Payer: Self-pay | Admitting: Internal Medicine

## 2015-09-04 DIAGNOSIS — M5136 Other intervertebral disc degeneration, lumbar region: Secondary | ICD-10-CM | POA: Diagnosis not present

## 2015-09-04 DIAGNOSIS — G894 Chronic pain syndrome: Secondary | ICD-10-CM | POA: Diagnosis not present

## 2015-09-04 DIAGNOSIS — G8929 Other chronic pain: Secondary | ICD-10-CM | POA: Diagnosis not present

## 2015-09-04 DIAGNOSIS — M545 Low back pain: Secondary | ICD-10-CM | POA: Diagnosis not present

## 2015-09-04 DIAGNOSIS — Z9689 Presence of other specified functional implants: Secondary | ICD-10-CM | POA: Diagnosis not present

## 2015-09-13 ENCOUNTER — Ambulatory Visit (INDEPENDENT_AMBULATORY_CARE_PROVIDER_SITE_OTHER): Payer: 59 | Admitting: Family Medicine

## 2015-09-13 ENCOUNTER — Encounter: Payer: Self-pay | Admitting: Family Medicine

## 2015-09-13 VITALS — BP 110/78 | HR 63 | Temp 98.2°F | Wt 158.5 lb

## 2015-09-13 DIAGNOSIS — R1032 Left lower quadrant pain: Secondary | ICD-10-CM

## 2015-09-13 DIAGNOSIS — F3170 Bipolar disorder, currently in remission, most recent episode unspecified: Secondary | ICD-10-CM

## 2015-09-13 DIAGNOSIS — R739 Hyperglycemia, unspecified: Secondary | ICD-10-CM | POA: Diagnosis not present

## 2015-09-13 DIAGNOSIS — R7989 Other specified abnormal findings of blood chemistry: Secondary | ICD-10-CM

## 2015-09-13 DIAGNOSIS — E039 Hypothyroidism, unspecified: Secondary | ICD-10-CM

## 2015-09-13 DIAGNOSIS — R945 Abnormal results of liver function studies: Principal | ICD-10-CM

## 2015-09-13 DIAGNOSIS — Z7189 Other specified counseling: Secondary | ICD-10-CM | POA: Diagnosis not present

## 2015-09-13 DIAGNOSIS — G894 Chronic pain syndrome: Secondary | ICD-10-CM

## 2015-09-13 NOTE — Patient Instructions (Signed)
Lab visit next week.  You don't have to fast.  We'll be in touch after I see the results.  Take care.  Glad to see you.

## 2015-09-13 NOTE — Progress Notes (Signed)
Pre visit review using our clinic review tool, if applicable. No additional management support is needed unless otherwise documented below in the visit note.  Seeing Dr. Haskel Khan at pain clinic for morphine pump and prn breakthrough med.  No ADE on med.   BAD per Dr. Nolen Mu, no ADE on meds. Compliant and doing well for sx control, but still on disability given his hx, per patient report.    LLQ pain.  Still with intermittent cramping pain, no upper abd pain, no RLQ pain.  No vomiting, some nausea.  No blood in stool.  No jaundice, no fevers.  No clear cause prev seen.  Due for f/u labs- he had orders from prev PCP but is here to est care and f/u for labs.  [facility] to [facility] at [facility], but we don't have a courier pick up for them before the weekend- I offered to give him a lab slip to have drawn in the meantime, he declined and he'll return for lab draw here.  This should be okay given the begin exam from day, absence of emergent sx, and the duration of the LLQ.  D/w pt.  He agrees.    PMH and SH reviewed  ROS: See HPI, otherwise noncontributory.  Meds, vitals, and allergies reviewed.   GEN: nad, alert and oriented, speech and judgement wnl,  HEENT: mucous membranes moist NECK: supple w/o LA CV: rrr. PULM: ctab, no inc wob ABD: soft, +bs, no ttp, morphine pump noted in the R lower abd wall.  EXT: no edema SKIN: no acute rash but scar noted on lower back.

## 2015-09-15 ENCOUNTER — Encounter: Payer: Self-pay | Admitting: Family Medicine

## 2015-09-15 DIAGNOSIS — R1032 Left lower quadrant pain: Secondary | ICD-10-CM | POA: Insufficient documentation

## 2015-09-15 DIAGNOSIS — Z7189 Other specified counseling: Secondary | ICD-10-CM | POA: Insufficient documentation

## 2015-09-15 NOTE — Assessment & Plan Note (Signed)
Unclear source. With mult prev abnormal labs, elevated LFTs and WBC.  Return for f/u labs.  Benign abd exam.  D/w pt, he agrees.  >25 minutes spent in face to face time with patient, >50% spent in counselling or coordination of care

## 2015-09-15 NOTE — Assessment & Plan Note (Signed)
Per psych clinic.  Okay for outpatient f/u.

## 2015-09-15 NOTE — Assessment & Plan Note (Signed)
Per pain clinic 

## 2015-09-15 NOTE — Assessment & Plan Note (Signed)
Reasonable to check TSH given his meds and hx, order in EMR.  Return for labs.

## 2015-09-16 ENCOUNTER — Other Ambulatory Visit: Payer: Medicare Other

## 2015-09-17 ENCOUNTER — Other Ambulatory Visit (INDEPENDENT_AMBULATORY_CARE_PROVIDER_SITE_OTHER): Payer: 59

## 2015-09-17 DIAGNOSIS — R7989 Other specified abnormal findings of blood chemistry: Secondary | ICD-10-CM | POA: Diagnosis not present

## 2015-09-17 DIAGNOSIS — R739 Hyperglycemia, unspecified: Secondary | ICD-10-CM

## 2015-09-17 DIAGNOSIS — E039 Hypothyroidism, unspecified: Secondary | ICD-10-CM

## 2015-09-17 DIAGNOSIS — R945 Abnormal results of liver function studies: Principal | ICD-10-CM

## 2015-09-18 LAB — CBC WITH DIFFERENTIAL/PLATELET
BASOS: 0 %
Basophils Absolute: 0 10*3/uL (ref 0.0–0.2)
EOS (ABSOLUTE): 0.1 10*3/uL (ref 0.0–0.4)
Eos: 2 %
Hematocrit: 43.5 % (ref 37.5–51.0)
Hemoglobin: 15 g/dL (ref 12.6–17.7)
IMMATURE GRANULOCYTES: 0 %
Immature Grans (Abs): 0 10*3/uL (ref 0.0–0.1)
LYMPHS ABS: 2.5 10*3/uL (ref 0.7–3.1)
Lymphs: 32 %
MCH: 32.1 pg (ref 26.6–33.0)
MCHC: 34.5 g/dL (ref 31.5–35.7)
MCV: 93 fL (ref 79–97)
MONOS ABS: 0.3 10*3/uL (ref 0.1–0.9)
Monocytes: 3 %
NEUTROS PCT: 63 %
Neutrophils Absolute: 5 10*3/uL (ref 1.4–7.0)
PLATELETS: 361 10*3/uL (ref 150–379)
RBC: 4.67 x10E6/uL (ref 4.14–5.80)
RDW: 12.3 % (ref 12.3–15.4)
WBC: 7.9 10*3/uL (ref 3.4–10.8)

## 2015-09-18 LAB — COMPREHENSIVE METABOLIC PANEL
ALK PHOS: 59 IU/L (ref 39–117)
ALT: 18 IU/L (ref 0–44)
AST: 18 IU/L (ref 0–40)
Albumin/Globulin Ratio: 2.2 (ref 1.1–2.5)
Albumin: 5.1 g/dL (ref 3.5–5.5)
BILIRUBIN TOTAL: 0.6 mg/dL (ref 0.0–1.2)
BUN / CREAT RATIO: 5 — AB (ref 9–20)
BUN: 7 mg/dL (ref 6–24)
CHLORIDE: 98 mmol/L (ref 96–106)
CO2: 28 mmol/L (ref 18–29)
Calcium: 9.6 mg/dL (ref 8.7–10.2)
Creatinine, Ser: 1.34 mg/dL — ABNORMAL HIGH (ref 0.76–1.27)
GFR calc Af Amer: 74 mL/min/{1.73_m2} (ref >59)
GFR calc non Af Amer: 64 mL/min/{1.73_m2} (ref >59)
GLUCOSE: 107 mg/dL — AB (ref 65–99)
Globulin, Total: 2.3 g/dL (ref 1.5–4.5)
POTASSIUM: 4.8 mmol/L (ref 3.5–5.2)
Sodium: 140 mmol/L (ref 134–144)
Total Protein: 7.4 g/dL (ref 6.0–8.5)

## 2015-09-18 LAB — LIPASE: Lipase: 31 U/L (ref 0–59)

## 2015-09-18 LAB — HEMOGLOBIN A1C
ESTIMATED AVERAGE GLUCOSE: 103 mg/dL
Hgb A1c MFr Bld: 5.2 % (ref 4.8–5.6)

## 2015-09-18 LAB — TSH: TSH: 1.6 u[IU]/mL (ref 0.450–4.500)

## 2015-10-23 DIAGNOSIS — G8929 Other chronic pain: Secondary | ICD-10-CM | POA: Diagnosis not present

## 2015-10-23 DIAGNOSIS — G894 Chronic pain syndrome: Secondary | ICD-10-CM | POA: Diagnosis not present

## 2015-10-23 DIAGNOSIS — Z9689 Presence of other specified functional implants: Secondary | ICD-10-CM | POA: Diagnosis not present

## 2015-10-23 DIAGNOSIS — M5136 Other intervertebral disc degeneration, lumbar region: Secondary | ICD-10-CM | POA: Diagnosis not present

## 2015-10-23 DIAGNOSIS — M545 Low back pain: Secondary | ICD-10-CM | POA: Diagnosis not present

## 2015-11-27 ENCOUNTER — Other Ambulatory Visit: Payer: Self-pay | Admitting: Internal Medicine

## 2015-11-27 NOTE — Telephone Encounter (Signed)
Sent. Thanks.   

## 2015-11-27 NOTE — Telephone Encounter (Signed)
Routing to patient's new pcp 

## 2015-12-04 DIAGNOSIS — Z9689 Presence of other specified functional implants: Secondary | ICD-10-CM | POA: Diagnosis not present

## 2015-12-04 DIAGNOSIS — M5136 Other intervertebral disc degeneration, lumbar region: Secondary | ICD-10-CM | POA: Diagnosis not present

## 2015-12-04 DIAGNOSIS — G894 Chronic pain syndrome: Secondary | ICD-10-CM | POA: Diagnosis not present

## 2015-12-04 DIAGNOSIS — M545 Low back pain: Secondary | ICD-10-CM | POA: Diagnosis not present

## 2015-12-04 DIAGNOSIS — G8929 Other chronic pain: Secondary | ICD-10-CM | POA: Diagnosis not present

## 2016-01-20 DIAGNOSIS — M546 Pain in thoracic spine: Secondary | ICD-10-CM | POA: Diagnosis not present

## 2016-01-20 DIAGNOSIS — G8929 Other chronic pain: Secondary | ICD-10-CM | POA: Diagnosis not present

## 2016-01-20 DIAGNOSIS — M5136 Other intervertebral disc degeneration, lumbar region: Secondary | ICD-10-CM | POA: Diagnosis not present

## 2016-01-20 DIAGNOSIS — Z9689 Presence of other specified functional implants: Secondary | ICD-10-CM | POA: Diagnosis not present

## 2016-01-20 DIAGNOSIS — M545 Low back pain: Secondary | ICD-10-CM | POA: Diagnosis not present

## 2016-01-20 DIAGNOSIS — G894 Chronic pain syndrome: Secondary | ICD-10-CM | POA: Diagnosis not present

## 2016-02-03 ENCOUNTER — Other Ambulatory Visit: Payer: Self-pay | Admitting: Internal Medicine

## 2016-02-03 DIAGNOSIS — E785 Hyperlipidemia, unspecified: Secondary | ICD-10-CM

## 2016-02-03 NOTE — Telephone Encounter (Signed)
Routing to patient's new pcp to handle 

## 2016-02-03 NOTE — Telephone Encounter (Signed)
Sent.  Due for f/u lipids, fasting, when possible.  Thanks. Order is in.

## 2016-02-04 NOTE — Telephone Encounter (Signed)
Left detailed message on voicemail to call in to schedule fasting lab appt.

## 2016-02-12 ENCOUNTER — Other Ambulatory Visit: Payer: 59

## 2016-02-13 ENCOUNTER — Other Ambulatory Visit (INDEPENDENT_AMBULATORY_CARE_PROVIDER_SITE_OTHER): Payer: 59

## 2016-02-13 DIAGNOSIS — E785 Hyperlipidemia, unspecified: Secondary | ICD-10-CM | POA: Diagnosis not present

## 2016-02-14 LAB — LIPID PANEL
CHOL/HDL RATIO: 10 ratio — AB (ref 0.0–5.0)
Cholesterol, Total: 211 mg/dL — ABNORMAL HIGH (ref 100–199)
HDL: 21 mg/dL — ABNORMAL LOW (ref >39)
LDL CALC: 156 mg/dL — AB (ref 0–99)
Triglycerides: 168 mg/dL — ABNORMAL HIGH (ref 0–149)
VLDL Cholesterol Cal: 34 mg/dL (ref 5–40)

## 2016-03-03 ENCOUNTER — Ambulatory Visit (INDEPENDENT_AMBULATORY_CARE_PROVIDER_SITE_OTHER): Payer: 59 | Admitting: Family Medicine

## 2016-03-03 ENCOUNTER — Encounter: Payer: Self-pay | Admitting: Family Medicine

## 2016-03-03 VITALS — BP 92/62 | HR 66 | Temp 97.9°F | Wt 171.5 lb

## 2016-03-03 DIAGNOSIS — E785 Hyperlipidemia, unspecified: Secondary | ICD-10-CM

## 2016-03-03 MED ORDER — ATORVASTATIN CALCIUM 10 MG PO TABS: 10.0000 mg | ORAL_TABLET | Freq: Every day | ORAL | 3 refills | Status: DC

## 2016-03-03 NOTE — Patient Instructions (Signed)
Add on lipitor 10mg  a day.  If aches in the meantime, then stop the medicine and notify me.  If tolerated, recheck labs in about 2 months.  Take care.  Glad to see you.

## 2016-03-03 NOTE — Progress Notes (Signed)
Pre visit review using our clinic review tool, if applicable. No additional management support is needed unless otherwise documented below in the visit note.  FH CAD and CVA noted.  D/w pt.  HLD d/w pt, esp re: his labs.   Weight is stable.  Exercise is limited by pain.   Diet is good, fresh fruits, etc.   framingham score is 4.6% but that is only a 10 year horizon and doesn't account for FH or his prev sugar.  D/w pt.   Meds, vitals, and allergies reviewed.   ROS: Per HPI unless specifically indicated in ROS section   GEN: nad, alert and oriented HEENT: mucous membranes moist NECK: supple w/o LA CV: rrr.   PULM: ctab, no inc wob ABD: soft, +bs EXT: no edema

## 2016-03-03 NOTE — Assessment & Plan Note (Signed)
framingham score is 4.6% but that is only a 10 year horizon and doesn't account for FH or his prev sugar.  D/w pt.  Would be reasonable to try statin given his low HDL and other risk factors.  He agrees.  Routine cautions d/w pt.  Start lipitor 10mg  a day, recheck LFTs and lipids in a few months.  Stop med if ADE and update me.  He agrees.   >25 minutes spent in face to face time with patient, >50% spent in counselling or coordination of care.

## 2016-03-05 DIAGNOSIS — G8929 Other chronic pain: Secondary | ICD-10-CM | POA: Diagnosis not present

## 2016-03-05 DIAGNOSIS — G894 Chronic pain syndrome: Secondary | ICD-10-CM | POA: Diagnosis not present

## 2016-03-05 DIAGNOSIS — M5136 Other intervertebral disc degeneration, lumbar region: Secondary | ICD-10-CM | POA: Diagnosis not present

## 2016-03-05 DIAGNOSIS — M545 Low back pain: Secondary | ICD-10-CM | POA: Diagnosis not present

## 2016-03-05 DIAGNOSIS — M546 Pain in thoracic spine: Secondary | ICD-10-CM | POA: Diagnosis not present

## 2016-03-05 DIAGNOSIS — Z9689 Presence of other specified functional implants: Secondary | ICD-10-CM | POA: Diagnosis not present

## 2016-03-16 ENCOUNTER — Other Ambulatory Visit: Payer: Self-pay | Admitting: *Deleted

## 2016-03-16 DIAGNOSIS — E785 Hyperlipidemia, unspecified: Secondary | ICD-10-CM

## 2016-03-16 MED ORDER — ATORVASTATIN CALCIUM 10 MG PO TABS: 10.0000 mg | ORAL_TABLET | Freq: Every day | ORAL | 3 refills | Status: DC

## 2016-04-20 DIAGNOSIS — M546 Pain in thoracic spine: Secondary | ICD-10-CM | POA: Diagnosis not present

## 2016-04-20 DIAGNOSIS — M545 Low back pain: Secondary | ICD-10-CM | POA: Diagnosis not present

## 2016-04-20 DIAGNOSIS — Z9689 Presence of other specified functional implants: Secondary | ICD-10-CM | POA: Diagnosis not present

## 2016-04-20 DIAGNOSIS — G894 Chronic pain syndrome: Secondary | ICD-10-CM | POA: Diagnosis not present

## 2016-04-20 DIAGNOSIS — Z79899 Other long term (current) drug therapy: Secondary | ICD-10-CM | POA: Diagnosis not present

## 2016-04-20 DIAGNOSIS — Z5181 Encounter for therapeutic drug level monitoring: Secondary | ICD-10-CM | POA: Diagnosis not present

## 2016-04-20 DIAGNOSIS — M5136 Other intervertebral disc degeneration, lumbar region: Secondary | ICD-10-CM | POA: Diagnosis not present

## 2016-05-04 ENCOUNTER — Other Ambulatory Visit: Payer: 59

## 2016-06-04 DIAGNOSIS — Z9689 Presence of other specified functional implants: Secondary | ICD-10-CM | POA: Diagnosis not present

## 2016-06-04 DIAGNOSIS — G894 Chronic pain syndrome: Secondary | ICD-10-CM | POA: Diagnosis not present

## 2016-06-04 DIAGNOSIS — M545 Low back pain: Secondary | ICD-10-CM | POA: Diagnosis not present

## 2016-06-04 DIAGNOSIS — M546 Pain in thoracic spine: Secondary | ICD-10-CM | POA: Diagnosis not present

## 2016-06-04 DIAGNOSIS — G8929 Other chronic pain: Secondary | ICD-10-CM | POA: Diagnosis not present

## 2016-07-21 DIAGNOSIS — Z9689 Presence of other specified functional implants: Secondary | ICD-10-CM | POA: Diagnosis not present

## 2016-07-21 DIAGNOSIS — G8929 Other chronic pain: Secondary | ICD-10-CM | POA: Diagnosis not present

## 2016-07-21 DIAGNOSIS — M545 Low back pain: Secondary | ICD-10-CM | POA: Diagnosis not present

## 2016-07-21 DIAGNOSIS — G894 Chronic pain syndrome: Secondary | ICD-10-CM | POA: Diagnosis not present

## 2016-07-21 DIAGNOSIS — M546 Pain in thoracic spine: Secondary | ICD-10-CM | POA: Diagnosis not present

## 2016-09-02 DIAGNOSIS — G8929 Other chronic pain: Secondary | ICD-10-CM | POA: Diagnosis not present

## 2016-09-02 DIAGNOSIS — G894 Chronic pain syndrome: Secondary | ICD-10-CM | POA: Diagnosis not present

## 2016-09-02 DIAGNOSIS — M545 Low back pain: Secondary | ICD-10-CM | POA: Diagnosis not present

## 2016-09-02 DIAGNOSIS — Z9689 Presence of other specified functional implants: Secondary | ICD-10-CM | POA: Diagnosis not present

## 2016-09-02 DIAGNOSIS — M62838 Other muscle spasm: Secondary | ICD-10-CM | POA: Diagnosis not present

## 2016-09-13 ENCOUNTER — Other Ambulatory Visit: Payer: Self-pay | Admitting: Family Medicine

## 2016-10-20 DIAGNOSIS — M545 Low back pain: Secondary | ICD-10-CM | POA: Diagnosis not present

## 2016-10-20 DIAGNOSIS — G894 Chronic pain syndrome: Secondary | ICD-10-CM | POA: Diagnosis not present

## 2016-10-20 DIAGNOSIS — G8929 Other chronic pain: Secondary | ICD-10-CM | POA: Diagnosis not present

## 2016-12-03 ENCOUNTER — Other Ambulatory Visit: Payer: Self-pay | Admitting: Family Medicine

## 2016-12-03 DIAGNOSIS — Z9689 Presence of other specified functional implants: Secondary | ICD-10-CM | POA: Diagnosis not present

## 2016-12-03 DIAGNOSIS — M545 Low back pain: Secondary | ICD-10-CM | POA: Diagnosis not present

## 2016-12-03 DIAGNOSIS — M546 Pain in thoracic spine: Secondary | ICD-10-CM | POA: Diagnosis not present

## 2016-12-03 DIAGNOSIS — M62838 Other muscle spasm: Secondary | ICD-10-CM | POA: Diagnosis not present

## 2016-12-03 DIAGNOSIS — G894 Chronic pain syndrome: Secondary | ICD-10-CM | POA: Diagnosis not present

## 2016-12-04 ENCOUNTER — Other Ambulatory Visit: Payer: 59

## 2016-12-07 ENCOUNTER — Other Ambulatory Visit: Payer: 59

## 2016-12-08 ENCOUNTER — Other Ambulatory Visit: Payer: 59

## 2016-12-10 LAB — HEMOGLOBIN A1C
ALT: 22
AST: 22 U/L
CREATININE: 1.14
Cholesterol, Total: 158
Folate: 10.2
GLUCOSE: 112
HDL Cholesterol: 31
HEMOGLOBIN: 15.2
Hemoglobin A1C: 5.2
LDL CHOLESTEROL (CALC): 96
TRIGLYCERIDES: 155
TSH: 3.82
VITAMIN B12: 504

## 2016-12-17 ENCOUNTER — Encounter: Payer: Self-pay | Admitting: Family Medicine

## 2016-12-22 ENCOUNTER — Other Ambulatory Visit: Payer: 59

## 2016-12-23 DIAGNOSIS — M545 Low back pain: Secondary | ICD-10-CM | POA: Diagnosis not present

## 2016-12-25 ENCOUNTER — Other Ambulatory Visit: Payer: 59

## 2017-01-01 DIAGNOSIS — M545 Low back pain: Secondary | ICD-10-CM | POA: Diagnosis not present

## 2017-01-07 DIAGNOSIS — M545 Low back pain: Secondary | ICD-10-CM | POA: Diagnosis not present

## 2017-01-13 DIAGNOSIS — M545 Low back pain: Secondary | ICD-10-CM | POA: Diagnosis not present

## 2017-01-14 DIAGNOSIS — Z5181 Encounter for therapeutic drug level monitoring: Secondary | ICD-10-CM | POA: Diagnosis not present

## 2017-01-14 DIAGNOSIS — Z79899 Other long term (current) drug therapy: Secondary | ICD-10-CM | POA: Diagnosis not present

## 2017-01-18 DIAGNOSIS — M545 Low back pain: Secondary | ICD-10-CM | POA: Diagnosis not present

## 2017-01-24 ENCOUNTER — Other Ambulatory Visit: Payer: Self-pay | Admitting: Family Medicine

## 2017-01-24 DIAGNOSIS — E785 Hyperlipidemia, unspecified: Secondary | ICD-10-CM

## 2017-02-05 DIAGNOSIS — M545 Low back pain: Secondary | ICD-10-CM | POA: Diagnosis not present

## 2017-02-22 ENCOUNTER — Other Ambulatory Visit: Payer: Self-pay | Admitting: Family Medicine

## 2017-02-23 NOTE — Telephone Encounter (Signed)
Electronic refill request. Levothyroxine Last office visit:   03/03/16, no upcoming appts scheduled. Last Filled:     90 tablet 0 12/04/2016  Please advise.

## 2017-02-24 NOTE — Telephone Encounter (Signed)
Needs OV scheduled, fasting labs ahead of time.  rx sent.

## 2017-02-24 NOTE — Telephone Encounter (Signed)
Patient notified as instructed by telephone and follow-up appointments scheduled.

## 2017-03-01 DIAGNOSIS — M545 Low back pain: Secondary | ICD-10-CM | POA: Diagnosis not present

## 2017-03-01 DIAGNOSIS — M546 Pain in thoracic spine: Secondary | ICD-10-CM | POA: Diagnosis not present

## 2017-03-01 DIAGNOSIS — M62838 Other muscle spasm: Secondary | ICD-10-CM | POA: Diagnosis not present

## 2017-03-01 DIAGNOSIS — G894 Chronic pain syndrome: Secondary | ICD-10-CM | POA: Diagnosis not present

## 2017-03-01 DIAGNOSIS — Z9689 Presence of other specified functional implants: Secondary | ICD-10-CM | POA: Diagnosis not present

## 2017-03-03 ENCOUNTER — Other Ambulatory Visit: Payer: 59

## 2017-03-04 ENCOUNTER — Other Ambulatory Visit: Payer: Self-pay | Admitting: Family Medicine

## 2017-03-04 ENCOUNTER — Other Ambulatory Visit (INDEPENDENT_AMBULATORY_CARE_PROVIDER_SITE_OTHER): Payer: 59

## 2017-03-04 DIAGNOSIS — R739 Hyperglycemia, unspecified: Secondary | ICD-10-CM

## 2017-03-05 ENCOUNTER — Ambulatory Visit: Payer: 59 | Admitting: Family Medicine

## 2017-03-05 LAB — GLUCOSE, RANDOM: GLUCOSE: 110 mg/dL — AB (ref 65–99)

## 2017-03-09 ENCOUNTER — Ambulatory Visit (INDEPENDENT_AMBULATORY_CARE_PROVIDER_SITE_OTHER): Payer: 59 | Admitting: Family Medicine

## 2017-03-09 ENCOUNTER — Encounter: Payer: Self-pay | Admitting: Family Medicine

## 2017-03-09 DIAGNOSIS — E785 Hyperlipidemia, unspecified: Secondary | ICD-10-CM | POA: Diagnosis not present

## 2017-03-09 MED ORDER — ATORVASTATIN CALCIUM 10 MG PO TABS: 10.0000 mg | ORAL_TABLET | Freq: Every day | ORAL | 3 refills | Status: DC

## 2017-03-09 NOTE — Patient Instructions (Addendum)
Don't change your meds for now and update me as needed.  Take care.  Glad to see you.  I would get a flu shot each fall.

## 2017-03-09 NOTE — Progress Notes (Signed)
Elevated Cholesterol: Using medications without problems:yes Muscle aches: no Diet compliance: yes, healthy foods, d/w pt.  Exercise: in PT for his back.   Labs d/w pt.  Lipids are reasonable, with low HDL noted.    Mild inc in sugar noted, d/w pt.  Encouraged lower carb diet, which he has been on.    Tetanus <10 years per patient report.    Psych meds per psych clinic, he'll check his med list at home.  BAD is "controlled" for now with some episodic changes in mood, episodically exacerbated by pain. Still on pain pump per pain clinic.  I'll defer.  PMH and SH reviewed  ROS: Per HPI unless specifically indicated in ROS section   Meds, vitals, and allergies reviewed.   GEN: nad, alert and oriented HEENT: mucous membranes moist NECK: supple w/o LA CV: rrr.  no murmur PULM: ctab, no inc wob ABD: soft, +bs EXT: no edema SKIN: no acute rash B hand tremor noted.   Speech and judgement appear intact.

## 2017-03-09 NOTE — Progress Notes (Signed)
No medications were reconciled as the patient says he is not taking some of the meds listed and is on others that are not listed but he doesn't know what they are.  Patient says he will reconcile his list at home through MyChart.  Ninetta LightsL. Rosalyn Archambault, CMA

## 2017-03-10 NOTE — Assessment & Plan Note (Signed)
Labs discussed with patient. Continue as is. Continue work on diet. Recheck yearly.

## 2017-03-31 DIAGNOSIS — G894 Chronic pain syndrome: Secondary | ICD-10-CM | POA: Diagnosis not present

## 2017-03-31 DIAGNOSIS — G8929 Other chronic pain: Secondary | ICD-10-CM | POA: Diagnosis not present

## 2017-03-31 DIAGNOSIS — M546 Pain in thoracic spine: Secondary | ICD-10-CM | POA: Diagnosis not present

## 2017-03-31 DIAGNOSIS — M545 Low back pain: Secondary | ICD-10-CM | POA: Diagnosis not present

## 2017-04-15 ENCOUNTER — Other Ambulatory Visit: Payer: Self-pay | Admitting: Family Medicine

## 2017-04-21 DIAGNOSIS — M6281 Muscle weakness (generalized): Secondary | ICD-10-CM | POA: Diagnosis not present

## 2017-04-21 DIAGNOSIS — M545 Low back pain: Secondary | ICD-10-CM | POA: Diagnosis not present

## 2017-04-23 MED ORDER — OMEGA-3-ACID ETHYL ESTERS 1 G PO CAPS
1.0000 | ORAL_CAPSULE | Freq: Two times a day (BID) | ORAL | 2 refills | Status: DC
Start: 2017-04-23 — End: 2018-02-25

## 2017-04-28 DIAGNOSIS — G894 Chronic pain syndrome: Secondary | ICD-10-CM | POA: Diagnosis not present

## 2017-04-28 DIAGNOSIS — Z9689 Presence of other specified functional implants: Secondary | ICD-10-CM | POA: Diagnosis not present

## 2017-04-28 DIAGNOSIS — M545 Low back pain: Secondary | ICD-10-CM | POA: Diagnosis not present

## 2017-04-28 DIAGNOSIS — G8929 Other chronic pain: Secondary | ICD-10-CM | POA: Diagnosis not present

## 2017-05-15 ENCOUNTER — Other Ambulatory Visit: Payer: Self-pay | Admitting: Family Medicine

## 2017-05-17 NOTE — Telephone Encounter (Signed)
Electronic refill request. Levothyroxine Last office visit:   03/09/17   Last TSH:  09/17/15 Last Filled:    90 tablet 0 02/24/2017  Please advise.

## 2017-05-18 NOTE — Telephone Encounter (Signed)
Sent.  TSH was 3.820 on 12/09/16.

## 2017-05-24 DIAGNOSIS — M545 Low back pain: Secondary | ICD-10-CM | POA: Diagnosis not present

## 2017-06-02 DIAGNOSIS — M545 Low back pain: Secondary | ICD-10-CM | POA: Diagnosis not present

## 2017-06-10 DIAGNOSIS — Z9689 Presence of other specified functional implants: Secondary | ICD-10-CM | POA: Diagnosis not present

## 2017-06-10 DIAGNOSIS — M5136 Other intervertebral disc degeneration, lumbar region: Secondary | ICD-10-CM | POA: Diagnosis not present

## 2017-06-10 DIAGNOSIS — M5417 Radiculopathy, lumbosacral region: Secondary | ICD-10-CM | POA: Diagnosis not present

## 2017-06-10 DIAGNOSIS — G894 Chronic pain syndrome: Secondary | ICD-10-CM | POA: Diagnosis not present

## 2017-06-16 DIAGNOSIS — M545 Low back pain: Secondary | ICD-10-CM | POA: Diagnosis not present

## 2017-06-21 DIAGNOSIS — F1421 Cocaine dependence, in remission: Secondary | ICD-10-CM | POA: Diagnosis not present

## 2017-06-21 DIAGNOSIS — F3131 Bipolar disorder, current episode depressed, mild: Secondary | ICD-10-CM | POA: Diagnosis not present

## 2017-06-21 DIAGNOSIS — Z79899 Other long term (current) drug therapy: Secondary | ICD-10-CM | POA: Diagnosis not present

## 2017-06-21 DIAGNOSIS — F5105 Insomnia due to other mental disorder: Secondary | ICD-10-CM | POA: Diagnosis not present

## 2017-06-30 DIAGNOSIS — G894 Chronic pain syndrome: Secondary | ICD-10-CM | POA: Diagnosis not present

## 2017-06-30 DIAGNOSIS — G8929 Other chronic pain: Secondary | ICD-10-CM | POA: Diagnosis not present

## 2017-06-30 DIAGNOSIS — M545 Low back pain: Secondary | ICD-10-CM | POA: Diagnosis not present

## 2017-06-30 DIAGNOSIS — Z9689 Presence of other specified functional implants: Secondary | ICD-10-CM | POA: Diagnosis not present

## 2017-07-06 DIAGNOSIS — M6281 Muscle weakness (generalized): Secondary | ICD-10-CM | POA: Diagnosis not present

## 2017-07-06 DIAGNOSIS — M545 Low back pain: Secondary | ICD-10-CM | POA: Diagnosis not present

## 2017-07-08 DIAGNOSIS — M6281 Muscle weakness (generalized): Secondary | ICD-10-CM | POA: Diagnosis not present

## 2017-07-08 DIAGNOSIS — M545 Low back pain: Secondary | ICD-10-CM | POA: Diagnosis not present

## 2017-07-14 DIAGNOSIS — F1421 Cocaine dependence, in remission: Secondary | ICD-10-CM | POA: Diagnosis not present

## 2017-07-14 DIAGNOSIS — F3131 Bipolar disorder, current episode depressed, mild: Secondary | ICD-10-CM | POA: Diagnosis not present

## 2017-07-14 DIAGNOSIS — Z79899 Other long term (current) drug therapy: Secondary | ICD-10-CM | POA: Diagnosis not present

## 2017-07-14 DIAGNOSIS — F5105 Insomnia due to other mental disorder: Secondary | ICD-10-CM | POA: Diagnosis not present

## 2017-07-21 DIAGNOSIS — Z9689 Presence of other specified functional implants: Secondary | ICD-10-CM | POA: Diagnosis not present

## 2017-07-21 DIAGNOSIS — G894 Chronic pain syndrome: Secondary | ICD-10-CM | POA: Diagnosis not present

## 2017-07-21 DIAGNOSIS — G8929 Other chronic pain: Secondary | ICD-10-CM | POA: Diagnosis not present

## 2017-07-21 DIAGNOSIS — M545 Low back pain: Secondary | ICD-10-CM | POA: Diagnosis not present

## 2017-07-27 DIAGNOSIS — M545 Low back pain: Secondary | ICD-10-CM | POA: Diagnosis not present

## 2017-07-27 DIAGNOSIS — M6281 Muscle weakness (generalized): Secondary | ICD-10-CM | POA: Diagnosis not present

## 2017-08-16 ENCOUNTER — Ambulatory Visit (INDEPENDENT_AMBULATORY_CARE_PROVIDER_SITE_OTHER): Payer: BLUE CROSS/BLUE SHIELD | Admitting: Family Medicine

## 2017-08-16 ENCOUNTER — Encounter: Payer: Self-pay | Admitting: Family Medicine

## 2017-08-16 DIAGNOSIS — M25569 Pain in unspecified knee: Secondary | ICD-10-CM | POA: Diagnosis not present

## 2017-08-16 NOTE — Patient Instructions (Signed)
Try a chopat strap and see if that helps.   You should be able to wear it as needed (also at night if needed) Keep icing.   Take care.  Glad to see you.

## 2017-08-16 NOTE — Progress Notes (Signed)
His wife's aunt recently died from cancer.  Patient's grandfather was a 46 year old Building control surveyor[employer] vet and recently died.  He is accepting the recent changes.  D/w pt.    Knee pain.  Left.  Was sleeping and the L leg was hanging off the side of the bed.  This was about 1 month ago.  Not much swelling now but was more puffy initially, no bruising at any point.  No other trauma.  Since then, he has pain with full extension in the AM, better as the day goes on.  Pain along medial L patella.  Able to bear weight.    He sleeps on his back or his R side usually.  Has been using ice and a knee sleeve.    Meds, vitals, and allergies reviewed.   ROS: Per HPI unless specifically indicated in ROS section   nad ncat Able to bear weight Left knee without bruising or redness.  Minimally puffy medial to the left patella.  He has intact range of motion but some discomfort with full extension and full flexion.  The joint line itself is not tender to palpation but he is a little tender medial to the left patella.  Patella itself is nontender.  Popliteal fossa nontender.  ACL MCL and LCL all feel solid on testing.

## 2017-08-17 DIAGNOSIS — M25569 Pain in unspecified knee: Secondary | ICD-10-CM | POA: Insufficient documentation

## 2017-08-17 NOTE — Assessment & Plan Note (Signed)
Likely benign strain versus irritation. Okay to try a chopat strap and see if that helps.   He should be able to wear it as needed (also at night if needed) Keep icing in the meantime.  Imaging likely not useful at this point.  Update me as needed.  He agrees.

## 2017-08-23 DIAGNOSIS — M545 Low back pain: Secondary | ICD-10-CM | POA: Diagnosis not present

## 2017-08-23 DIAGNOSIS — M6281 Muscle weakness (generalized): Secondary | ICD-10-CM | POA: Diagnosis not present

## 2017-09-02 DIAGNOSIS — M545 Low back pain: Secondary | ICD-10-CM | POA: Diagnosis not present

## 2017-09-02 DIAGNOSIS — M5136 Other intervertebral disc degeneration, lumbar region: Secondary | ICD-10-CM | POA: Diagnosis not present

## 2017-09-02 DIAGNOSIS — G894 Chronic pain syndrome: Secondary | ICD-10-CM | POA: Diagnosis not present

## 2017-09-02 DIAGNOSIS — Z9689 Presence of other specified functional implants: Secondary | ICD-10-CM | POA: Diagnosis not present

## 2017-09-06 DIAGNOSIS — M545 Low back pain: Secondary | ICD-10-CM | POA: Diagnosis not present

## 2017-09-06 DIAGNOSIS — M6281 Muscle weakness (generalized): Secondary | ICD-10-CM | POA: Diagnosis not present

## 2017-09-08 DIAGNOSIS — F3131 Bipolar disorder, current episode depressed, mild: Secondary | ICD-10-CM | POA: Diagnosis not present

## 2017-09-08 DIAGNOSIS — F1421 Cocaine dependence, in remission: Secondary | ICD-10-CM | POA: Diagnosis not present

## 2017-09-08 DIAGNOSIS — F5105 Insomnia due to other mental disorder: Secondary | ICD-10-CM | POA: Diagnosis not present

## 2017-09-08 DIAGNOSIS — Z79899 Other long term (current) drug therapy: Secondary | ICD-10-CM | POA: Diagnosis not present

## 2017-09-14 DIAGNOSIS — M545 Low back pain: Secondary | ICD-10-CM | POA: Diagnosis not present

## 2017-09-14 DIAGNOSIS — M6281 Muscle weakness (generalized): Secondary | ICD-10-CM | POA: Diagnosis not present

## 2017-09-17 DIAGNOSIS — M545 Low back pain: Secondary | ICD-10-CM | POA: Diagnosis not present

## 2017-09-17 DIAGNOSIS — M6281 Muscle weakness (generalized): Secondary | ICD-10-CM | POA: Diagnosis not present

## 2017-09-29 DIAGNOSIS — M6281 Muscle weakness (generalized): Secondary | ICD-10-CM | POA: Diagnosis not present

## 2017-09-29 DIAGNOSIS — M545 Low back pain: Secondary | ICD-10-CM | POA: Diagnosis not present

## 2017-10-01 DIAGNOSIS — F413 Other mixed anxiety disorders: Secondary | ICD-10-CM | POA: Diagnosis not present

## 2017-10-01 DIAGNOSIS — F1421 Cocaine dependence, in remission: Secondary | ICD-10-CM | POA: Diagnosis not present

## 2017-10-01 DIAGNOSIS — F3132 Bipolar disorder, current episode depressed, moderate: Secondary | ICD-10-CM | POA: Diagnosis not present

## 2017-10-13 DIAGNOSIS — M545 Low back pain: Secondary | ICD-10-CM | POA: Diagnosis not present

## 2017-10-13 DIAGNOSIS — M6281 Muscle weakness (generalized): Secondary | ICD-10-CM | POA: Diagnosis not present

## 2017-10-18 DIAGNOSIS — M5136 Other intervertebral disc degeneration, lumbar region: Secondary | ICD-10-CM | POA: Diagnosis not present

## 2017-10-18 DIAGNOSIS — G894 Chronic pain syndrome: Secondary | ICD-10-CM | POA: Diagnosis not present

## 2017-10-18 DIAGNOSIS — M545 Low back pain: Secondary | ICD-10-CM | POA: Diagnosis not present

## 2017-10-18 DIAGNOSIS — G8929 Other chronic pain: Secondary | ICD-10-CM | POA: Diagnosis not present

## 2017-10-18 DIAGNOSIS — K5903 Drug induced constipation: Secondary | ICD-10-CM | POA: Diagnosis not present

## 2017-11-08 DIAGNOSIS — M6281 Muscle weakness (generalized): Secondary | ICD-10-CM | POA: Diagnosis not present

## 2017-11-08 DIAGNOSIS — M545 Low back pain: Secondary | ICD-10-CM | POA: Diagnosis not present

## 2017-11-10 DIAGNOSIS — F1421 Cocaine dependence, in remission: Secondary | ICD-10-CM | POA: Diagnosis not present

## 2017-11-10 DIAGNOSIS — F3132 Bipolar disorder, current episode depressed, moderate: Secondary | ICD-10-CM | POA: Diagnosis not present

## 2017-11-10 DIAGNOSIS — F413 Other mixed anxiety disorders: Secondary | ICD-10-CM | POA: Diagnosis not present

## 2017-11-29 DIAGNOSIS — M5136 Other intervertebral disc degeneration, lumbar region: Secondary | ICD-10-CM | POA: Diagnosis not present

## 2017-11-29 DIAGNOSIS — M545 Low back pain: Secondary | ICD-10-CM | POA: Diagnosis not present

## 2017-11-29 DIAGNOSIS — Z9689 Presence of other specified functional implants: Secondary | ICD-10-CM | POA: Diagnosis not present

## 2017-11-29 DIAGNOSIS — G8929 Other chronic pain: Secondary | ICD-10-CM | POA: Diagnosis not present

## 2017-11-29 DIAGNOSIS — G894 Chronic pain syndrome: Secondary | ICD-10-CM | POA: Diagnosis not present

## 2017-11-29 DIAGNOSIS — K5903 Drug induced constipation: Secondary | ICD-10-CM | POA: Diagnosis not present

## 2017-11-30 DIAGNOSIS — F1421 Cocaine dependence, in remission: Secondary | ICD-10-CM | POA: Diagnosis not present

## 2017-11-30 DIAGNOSIS — F3131 Bipolar disorder, current episode depressed, mild: Secondary | ICD-10-CM | POA: Diagnosis not present

## 2017-11-30 DIAGNOSIS — F5105 Insomnia due to other mental disorder: Secondary | ICD-10-CM | POA: Diagnosis not present

## 2017-12-08 DIAGNOSIS — F3132 Bipolar disorder, current episode depressed, moderate: Secondary | ICD-10-CM | POA: Diagnosis not present

## 2017-12-08 DIAGNOSIS — F1421 Cocaine dependence, in remission: Secondary | ICD-10-CM | POA: Diagnosis not present

## 2017-12-08 DIAGNOSIS — F413 Other mixed anxiety disorders: Secondary | ICD-10-CM | POA: Diagnosis not present

## 2017-12-09 DIAGNOSIS — M545 Low back pain: Secondary | ICD-10-CM | POA: Diagnosis not present

## 2017-12-09 DIAGNOSIS — M6281 Muscle weakness (generalized): Secondary | ICD-10-CM | POA: Diagnosis not present

## 2017-12-13 ENCOUNTER — Other Ambulatory Visit: Payer: Self-pay | Admitting: Internal Medicine

## 2017-12-17 DIAGNOSIS — M6281 Muscle weakness (generalized): Secondary | ICD-10-CM | POA: Diagnosis not present

## 2017-12-17 DIAGNOSIS — M545 Low back pain: Secondary | ICD-10-CM | POA: Diagnosis not present

## 2017-12-20 DIAGNOSIS — S8391XA Sprain of unspecified site of right knee, initial encounter: Secondary | ICD-10-CM | POA: Diagnosis not present

## 2017-12-20 DIAGNOSIS — M6281 Muscle weakness (generalized): Secondary | ICD-10-CM | POA: Diagnosis not present

## 2017-12-20 DIAGNOSIS — M545 Low back pain: Secondary | ICD-10-CM | POA: Diagnosis not present

## 2017-12-23 DIAGNOSIS — S8391XA Sprain of unspecified site of right knee, initial encounter: Secondary | ICD-10-CM | POA: Diagnosis not present

## 2018-01-05 DIAGNOSIS — F1421 Cocaine dependence, in remission: Secondary | ICD-10-CM | POA: Diagnosis not present

## 2018-01-05 DIAGNOSIS — F413 Other mixed anxiety disorders: Secondary | ICD-10-CM | POA: Diagnosis not present

## 2018-01-05 DIAGNOSIS — F3132 Bipolar disorder, current episode depressed, moderate: Secondary | ICD-10-CM | POA: Diagnosis not present

## 2018-01-11 DIAGNOSIS — M545 Low back pain: Secondary | ICD-10-CM | POA: Diagnosis not present

## 2018-01-11 DIAGNOSIS — M6281 Muscle weakness (generalized): Secondary | ICD-10-CM | POA: Diagnosis not present

## 2018-01-12 DIAGNOSIS — Z79899 Other long term (current) drug therapy: Secondary | ICD-10-CM | POA: Diagnosis not present

## 2018-01-12 DIAGNOSIS — M5136 Other intervertebral disc degeneration, lumbar region: Secondary | ICD-10-CM | POA: Diagnosis not present

## 2018-01-12 DIAGNOSIS — M545 Low back pain: Secondary | ICD-10-CM | POA: Diagnosis not present

## 2018-01-12 DIAGNOSIS — Z9689 Presence of other specified functional implants: Secondary | ICD-10-CM | POA: Diagnosis not present

## 2018-01-12 DIAGNOSIS — G894 Chronic pain syndrome: Secondary | ICD-10-CM | POA: Diagnosis not present

## 2018-01-12 DIAGNOSIS — Z5181 Encounter for therapeutic drug level monitoring: Secondary | ICD-10-CM | POA: Diagnosis not present

## 2018-01-12 DIAGNOSIS — K5903 Drug induced constipation: Secondary | ICD-10-CM | POA: Diagnosis not present

## 2018-01-14 ENCOUNTER — Other Ambulatory Visit: Payer: Self-pay | Admitting: Family Medicine

## 2018-01-21 DIAGNOSIS — M25561 Pain in right knee: Secondary | ICD-10-CM | POA: Diagnosis not present

## 2018-01-26 DIAGNOSIS — M545 Low back pain: Secondary | ICD-10-CM | POA: Diagnosis not present

## 2018-01-26 DIAGNOSIS — M6281 Muscle weakness (generalized): Secondary | ICD-10-CM | POA: Diagnosis not present

## 2018-01-27 ENCOUNTER — Other Ambulatory Visit: Payer: Self-pay | Admitting: Orthopedic Surgery

## 2018-01-27 DIAGNOSIS — M25561 Pain in right knee: Secondary | ICD-10-CM

## 2018-01-28 DIAGNOSIS — M545 Low back pain: Secondary | ICD-10-CM | POA: Diagnosis not present

## 2018-01-28 DIAGNOSIS — M6281 Muscle weakness (generalized): Secondary | ICD-10-CM | POA: Diagnosis not present

## 2018-01-31 ENCOUNTER — Ambulatory Visit
Admission: RE | Admit: 2018-01-31 | Discharge: 2018-01-31 | Disposition: A | Discharge status: Home / Self Care | Payer: BLUE CROSS/BLUE SHIELD | Source: Ambulatory Visit | Attending: Orthopedic Surgery | Admitting: Orthopedic Surgery

## 2018-01-31 DIAGNOSIS — S83004A Unspecified dislocation of right patella, initial encounter: Secondary | ICD-10-CM | POA: Diagnosis not present

## 2018-01-31 DIAGNOSIS — M25561 Pain in right knee: Secondary | ICD-10-CM | POA: Insufficient documentation

## 2018-02-01 DIAGNOSIS — F1421 Cocaine dependence, in remission: Secondary | ICD-10-CM | POA: Diagnosis not present

## 2018-02-01 DIAGNOSIS — F5105 Insomnia due to other mental disorder: Secondary | ICD-10-CM | POA: Diagnosis not present

## 2018-02-01 DIAGNOSIS — F411 Generalized anxiety disorder: Secondary | ICD-10-CM | POA: Diagnosis not present

## 2018-02-01 DIAGNOSIS — F3131 Bipolar disorder, current episode depressed, mild: Secondary | ICD-10-CM | POA: Diagnosis not present

## 2018-02-04 DIAGNOSIS — M222X1 Patellofemoral disorders, right knee: Secondary | ICD-10-CM | POA: Diagnosis not present

## 2018-02-08 DIAGNOSIS — M6281 Muscle weakness (generalized): Secondary | ICD-10-CM | POA: Diagnosis not present

## 2018-02-08 DIAGNOSIS — M545 Low back pain: Secondary | ICD-10-CM | POA: Diagnosis not present

## 2018-02-14 ENCOUNTER — Ambulatory Visit: Payer: Medicare Other

## 2018-02-21 ENCOUNTER — Encounter: Payer: Medicare Other | Admitting: Family Medicine

## 2018-02-23 ENCOUNTER — Other Ambulatory Visit: Payer: Self-pay | Admitting: Family Medicine

## 2018-02-23 DIAGNOSIS — G8929 Other chronic pain: Secondary | ICD-10-CM | POA: Diagnosis not present

## 2018-02-23 DIAGNOSIS — Z9689 Presence of other specified functional implants: Secondary | ICD-10-CM | POA: Diagnosis not present

## 2018-02-23 DIAGNOSIS — G894 Chronic pain syndrome: Secondary | ICD-10-CM | POA: Diagnosis not present

## 2018-02-23 DIAGNOSIS — M545 Low back pain: Secondary | ICD-10-CM | POA: Diagnosis not present

## 2018-02-24 NOTE — Telephone Encounter (Signed)
Electronic refill request. [facility] Last office visit:   08/16/17 Last Filled:    180 capsule 2 04/23/2017  Please advise.

## 2018-02-25 NOTE — Telephone Encounter (Signed)
Sent. Thanks. He had f/u pending.

## 2018-02-28 ENCOUNTER — Other Ambulatory Visit: Payer: Self-pay | Admitting: Family Medicine

## 2018-02-28 ENCOUNTER — Ambulatory Visit (INDEPENDENT_AMBULATORY_CARE_PROVIDER_SITE_OTHER): Payer: BLUE CROSS/BLUE SHIELD

## 2018-02-28 VITALS — BP 102/78 | HR 90 | Temp 99.1°F | Ht 64.0 in | Wt 191.2 lb

## 2018-02-28 DIAGNOSIS — Z0000 Encounter for general adult medical examination without abnormal findings: Secondary | ICD-10-CM | POA: Diagnosis not present

## 2018-02-28 DIAGNOSIS — E559 Vitamin D deficiency, unspecified: Secondary | ICD-10-CM

## 2018-02-28 DIAGNOSIS — E039 Hypothyroidism, unspecified: Secondary | ICD-10-CM

## 2018-02-28 DIAGNOSIS — E785 Hyperlipidemia, unspecified: Secondary | ICD-10-CM

## 2018-02-28 NOTE — Patient Instructions (Signed)
Mr. MINERVAnt , Thank you for taking time to come for your Medicare Wellness Visit. I appreciate your ongoing commitment to your health goals. Please review the following plan we discussed and let me know if I can assist you in the future.   These are the goals we discussed: Goals    . Increase physical activity     Starting 02/28/2018, I will continue to exercise 30-60 minutes daily.        This is a list of the screening recommended for you and due dates:  Health Maintenance  Topic Date Due  . Tetanus Vaccine  03/01/2019*  . HIV Screening  02/28/2049*  . Flu Shot  03/10/2018  *Topic was postponed. The date shown is not the original due date.   Preventive Care for Adults  A healthy lifestyle and preventive care can promote health and wellness. Preventive health guidelines for adults include the following key practices.  . A routine yearly physical is a good way to check with your health care provider about your health and preventive screening. It is a chance to share any concerns and updates on your health and to receive a thorough exam.  . Visit your dentist for a routine exam and preventive care every 6 months. Brush your teeth twice a day and floss once a day. Good oral hygiene prevents tooth decay and gum disease.  . The frequency of eye exams is based on your age, health, family medical history, use  of contact lenses, and other factors. Follow your health care provider's recommendations for frequency of eye exams.  . Eat a healthy diet. Foods like vegetables, fruits, whole grains, low-fat dairy products, and lean protein foods contain the nutrients you need without too many calories. Decrease your intake of foods high in solid fats, added sugars, and salt. Eat the right amount of calories for you. Get information about a proper diet from your health care provider, if necessary.  . Regular physical exercise is one of the most important things you can do for your health. Most adults  should get at least 150 minutes of moderate-intensity exercise (any activity that increases your heart rate and causes you to sweat) each week. In addition, most adults need muscle-strengthening exercises on 2 or more days a week.  Silver Sneakers may be a benefit available to you. To determine eligibility, you may visit the website: www.silversneakers.com or contact program at [phone]) [phone] Mon-Fri between 8AM-8PM.   . Maintain a healthy weight. The body mass index (BMI) is a screening tool to identify possible weight problems. It provides an estimate of body fat based on height and weight. Your health care provider can find your BMI and can help you achieve or maintain a healthy weight.   For adults 20 years and older: ? A BMI below 18.5 is considered underweight. ? A BMI of 18.5 to 24.9 is normal. ? A BMI of 25 to 29.9 is considered overweight. ? A BMI of 30 and above is considered obese.   . Maintain normal blood lipids and cholesterol levels by exercising and minimizing your intake of saturated fat. Eat a balanced diet with plenty of fruit and vegetables. Blood tests for lipids and cholesterol should begin at age 46 and be repeated every 5 years. If your lipid or cholesterol levels are high, you are over 50, or you are at high risk for heart disease, you may need your cholesterol levels checked more frequently. Ongoing high lipid and cholesterol levels should be treated with  medicines if diet and exercise are not working.  . If you smoke, find out from your health care provider how to quit. If you do not use tobacco, please do not start.  . If you choose to drink alcohol, please do not consume more than 2 drinks per day. One drink is considered to be 12 ounces (355 mL) of beer, 5 ounces (148 mL) of wine, or 1.5 ounces (44 mL) of liquor.  . If you are 62-32 years old, ask your health care provider if you should take aspirin to prevent strokes.  . Use sunscreen. Apply sunscreen liberally and  repeatedly throughout the day. You should seek shade when your shadow is shorter than you. Protect yourself by wearing long sleeves, pants, a wide-brimmed hat, and sunglasses year round, whenever you are outdoors.  . Once a month, do a whole body skin exam, using a mirror to look at the skin on your back. Tell your health care provider of new moles, moles that have irregular borders, moles that are larger than a pencil eraser, or moles that have changed in shape or color.

## 2018-02-28 NOTE — Progress Notes (Signed)
PCP notes:   Health maintenance:  HIV screening - pt declined Tetanus vaccine - postponed/insurance  Abnormal screenings:   Depression score: 14 [facility] 02/28/2018 03/09/2017  Decreased Interest 3 3  Down, Depressed, Hopeless 3 3  PHQ - 2 Score 6 6  Altered sleeping 2 -  Tired, decreased energy 3 -  Change in appetite 1 -  Feeling bad or failure about yourself  2 -  Trouble concentrating 0 -  Moving slowly or fidgety/restless 0 -  Suicidal thoughts 0 -  PHQ-9 Score 14 -  Difficult doing work/chores Very difficult -   Patient concerns:   Patient verbalized concerns with unintentional weight gain caused by medications.   Nurse concerns:  None  Next PCP appt:   03/07/18 @ 1600  I reviewed health advisor's note, was available for consultation on the day of service listed in this note, and agree with documentation and plan. Crawford GivensGraham Duncan, MD.
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
## 2018-03-01 ENCOUNTER — Other Ambulatory Visit: Payer: Medicare Other

## 2018-03-02 ENCOUNTER — Other Ambulatory Visit (INDEPENDENT_AMBULATORY_CARE_PROVIDER_SITE_OTHER): Payer: BLUE CROSS/BLUE SHIELD

## 2018-03-02 ENCOUNTER — Other Ambulatory Visit: Payer: Medicare Other

## 2018-03-02 DIAGNOSIS — E785 Hyperlipidemia, unspecified: Secondary | ICD-10-CM | POA: Diagnosis not present

## 2018-03-02 DIAGNOSIS — E559 Vitamin D deficiency, unspecified: Secondary | ICD-10-CM

## 2018-03-02 DIAGNOSIS — E039 Hypothyroidism, unspecified: Secondary | ICD-10-CM

## 2018-03-02 NOTE — Addendum Note (Signed)
Addended by: Alvina ChouWALSH, TERRI J on: 03/02/2018 12:54 PM   Modules accepted: Orders

## 2018-03-02 NOTE — Addendum Note (Signed)
Addended by: WALSH, TERRI J on: 03/02/2018 12:54 PM   Modules accepted: Orders  

## 2018-03-03 LAB — COMPREHENSIVE METABOLIC PANEL
ALBUMIN: 4.7 g/dL (ref 3.5–5.5)
ALT: 37 IU/L (ref 0–44)
AST: 26 IU/L (ref 0–40)
Albumin/Globulin Ratio: 1.9 (ref 1.2–2.2)
Alkaline Phosphatase: 83 IU/L (ref 39–117)
BUN / CREAT RATIO: 10 (ref 9–20)
BUN: 12 mg/dL (ref 6–24)
Bilirubin Total: <0.2 mg/dL (ref 0.0–1.2)
CO2: 24 mmol/L (ref 20–29)
CREATININE: 1.16 mg/dL (ref 0.76–1.27)
Calcium: 9.3 mg/dL (ref 8.7–10.2)
Chloride: 104 mmol/L (ref 96–106)
GFR calc Af Amer: 87 mL/min/{1.73_m2} (ref >59)
GFR calc non Af Amer: 75 mL/min/{1.73_m2} (ref >59)
GLUCOSE: 109 mg/dL — AB (ref 65–99)
Globulin, Total: 2.5 g/dL (ref 1.5–4.5)
Potassium: 4.6 mmol/L (ref 3.5–5.2)
Sodium: 144 mmol/L (ref 134–144)
TOTAL PROTEIN: 7.2 g/dL (ref 6.0–8.5)

## 2018-03-03 LAB — LIPID PANEL
CHOLESTEROL TOTAL: 152 mg/dL (ref 100–199)
Chol/HDL Ratio: 4.6 ratio (ref 0.0–5.0)
HDL: 33 mg/dL — ABNORMAL LOW (ref >39)
LDL CALC: 85 mg/dL (ref 0–99)
Triglycerides: 171 mg/dL — ABNORMAL HIGH (ref 0–149)
VLDL CHOLESTEROL CAL: 34 mg/dL (ref 5–40)

## 2018-03-03 LAB — TSH: TSH: 1.65 u[IU]/mL (ref 0.450–4.500)

## 2018-03-03 LAB — VITAMIN D 25 HYDROXY (VIT D DEFICIENCY, FRACTURES): Vit D, 25-Hydroxy: 30.9 ng/mL (ref 30.0–100.0)

## 2018-03-04 DIAGNOSIS — M6281 Muscle weakness (generalized): Secondary | ICD-10-CM | POA: Diagnosis not present

## 2018-03-04 DIAGNOSIS — M545 Low back pain: Secondary | ICD-10-CM | POA: Diagnosis not present

## 2018-03-07 ENCOUNTER — Ambulatory Visit (INDEPENDENT_AMBULATORY_CARE_PROVIDER_SITE_OTHER): Payer: BLUE CROSS/BLUE SHIELD | Admitting: Family Medicine

## 2018-03-07 ENCOUNTER — Encounter: Payer: Self-pay | Admitting: Family Medicine

## 2018-03-07 VITALS — BP 102/78 | HR 90 | Temp 99.1°F | Ht 64.0 in | Wt 191.2 lb

## 2018-03-07 DIAGNOSIS — E039 Hypothyroidism, unspecified: Secondary | ICD-10-CM

## 2018-03-07 DIAGNOSIS — Z7189 Other specified counseling: Secondary | ICD-10-CM

## 2018-03-07 DIAGNOSIS — R195 Other fecal abnormalities: Secondary | ICD-10-CM | POA: Diagnosis not present

## 2018-03-07 DIAGNOSIS — F3170 Bipolar disorder, currently in remission, most recent episode unspecified: Secondary | ICD-10-CM

## 2018-03-07 DIAGNOSIS — E785 Hyperlipidemia, unspecified: Secondary | ICD-10-CM | POA: Diagnosis not present

## 2018-03-07 DIAGNOSIS — G894 Chronic pain syndrome: Secondary | ICD-10-CM

## 2018-03-07 DIAGNOSIS — Z0000 Encounter for general adult medical examination without abnormal findings: Secondary | ICD-10-CM

## 2018-03-07 MED ORDER — OMEGA-3-ACID ETHYL ESTERS 1 G PO CAPS: ORAL_CAPSULE | ORAL | Status: DC

## 2018-03-07 NOTE — Patient Instructions (Addendum)
Let me know if you need a MMR titer (blood test to check for vaccination status). We will call about your referral.  [name] or [name] will call you if you don't see one of them on the way out.  Add on miralax and see if that helps in the meantime- if the dulcolax doesn't help.  Take care.  Glad to see you.

## 2018-03-07 NOTE — Progress Notes (Signed)
Form done for work.   See avs.  D/w pt.   Tetanus <10 years per patient report.   Flu usually done at pharmacy.   PNA and shingles not due Prostate cancer screening not due.  Living will d/w pt.  Wife designated if patient were incapacitated.    Elevated Cholesterol: Using medications without problems: yes Muscle aches: no Diet compliance: yes Exercise: as tolerated  Labs d/w pt.    Hypothyroidism.  Compliant. TSH wnl.  D/w pt.  Not TMG on exam.   He is still on morphine pump with prn nucynta per outside clinic.      D/w pt about weight gain likely related to current psych meds.  He is working on diet and exercise.  I'll defer to psych, pt agrees.  No Si/Hi.  No manic episodes.    He has had some blood with stools, esp with irritation with big BMs.  No bleeding o/w but he has had some black stools.  He is taking colace w/ regularity recently improved.    PMH and SH reviewed  Meds, vitals, and allergies reviewed.   ROS: Per HPI.  Unless specifically indicated otherwise in HPI, the patient denies:  General: fever. Eyes: acute vision changes Chihuahua: sore throat Cardiovascular: chest pain Respiratory: SOB GI: vomiting GU: dysuria Musculoskeletal: acute back pain Derm: acute rash Neuro: acute motor dysfunction Psych: worsening mood Endocrine: polydipsia Heme: bleeding Allergy: hayfever  GEN: nad, alert and oriented HEENT: mucous membranes moist NECK: supple w/o LA CV: rrr. PULM: ctab, no inc wob ABD: soft, +bs EXT: no edema SKIN: no acute rash

## 2018-03-08 DIAGNOSIS — R195 Other fecal abnormalities: Secondary | ICD-10-CM | POA: Insufficient documentation

## 2018-03-08 DIAGNOSIS — Z0000 Encounter for general adult medical examination without abnormal findings: Secondary | ICD-10-CM | POA: Insufficient documentation

## 2018-03-08 NOTE — Assessment & Plan Note (Signed)
It sounded like he had some hemorrhoidal bleeding but he is also having black stools so it is reasonable to refer to GI.  D/w pt.  He agrees.  At this point, okay for outpatient f/u.

## 2018-03-08 NOTE — Assessment & Plan Note (Signed)
Living will d/w pt.  Wife designated if patient were incapacitated.   ?

## 2018-03-08 NOTE — Assessment & Plan Note (Signed)
TSH normal.  Continue as is.  Labs discussed with patient.  He agrees.

## 2018-03-08 NOTE — Assessment & Plan Note (Signed)
Tetanus <10 years per patient report.   Flu usually done at pharmacy.   PNA and shingles not due Prostate cancer screening not due.  Living will d/w pt.  Wife designated if patient were incapacitated.

## 2018-03-08 NOTE — Assessment & Plan Note (Signed)
Continue work on diet and exercise.  Continue statin.  He agrees.  Labs discussed with patient.

## 2018-03-08 NOTE — Assessment & Plan Note (Signed)
Per psych, with med effect of weight gain likely.  Mood is reasonable.  Okay for outpatient follow-up.  Continue work on diet and exercise.

## 2018-03-08 NOTE — Assessment & Plan Note (Signed)
Per outside clinic. 

## 2018-03-12 ENCOUNTER — Other Ambulatory Visit: Payer: Self-pay | Admitting: Family Medicine

## 2018-03-12 DIAGNOSIS — E785 Hyperlipidemia, unspecified: Secondary | ICD-10-CM

## 2018-03-16 DIAGNOSIS — M6281 Muscle weakness (generalized): Secondary | ICD-10-CM | POA: Diagnosis not present

## 2018-03-16 DIAGNOSIS — M545 Low back pain: Secondary | ICD-10-CM | POA: Diagnosis not present

## 2018-03-22 DIAGNOSIS — M545 Low back pain: Secondary | ICD-10-CM | POA: Diagnosis not present

## 2018-03-22 DIAGNOSIS — M6281 Muscle weakness (generalized): Secondary | ICD-10-CM | POA: Diagnosis not present

## 2018-03-24 DIAGNOSIS — M6281 Muscle weakness (generalized): Secondary | ICD-10-CM | POA: Diagnosis not present

## 2018-03-24 DIAGNOSIS — M545 Low back pain: Secondary | ICD-10-CM | POA: Diagnosis not present

## 2018-03-29 DIAGNOSIS — F3131 Bipolar disorder, current episode depressed, mild: Secondary | ICD-10-CM | POA: Diagnosis not present

## 2018-03-29 DIAGNOSIS — F1421 Cocaine dependence, in remission: Secondary | ICD-10-CM | POA: Diagnosis not present

## 2018-03-29 DIAGNOSIS — F411 Generalized anxiety disorder: Secondary | ICD-10-CM | POA: Diagnosis not present

## 2018-03-29 DIAGNOSIS — F5105 Insomnia due to other mental disorder: Secondary | ICD-10-CM | POA: Diagnosis not present

## 2018-04-07 DIAGNOSIS — Z9689 Presence of other specified functional implants: Secondary | ICD-10-CM | POA: Diagnosis not present

## 2018-04-07 DIAGNOSIS — G894 Chronic pain syndrome: Secondary | ICD-10-CM | POA: Diagnosis not present

## 2018-04-07 DIAGNOSIS — M5417 Radiculopathy, lumbosacral region: Secondary | ICD-10-CM | POA: Diagnosis not present

## 2018-04-13 ENCOUNTER — Telehealth: Payer: Self-pay | Admitting: Family Medicine

## 2018-04-13 NOTE — Telephone Encounter (Signed)
Noted. Thanks.

## 2018-04-13 NOTE — Telephone Encounter (Signed)
Copied from CRM [id number]. Topic: Quick Communication - See Telephone Encounter >> Apr 13, 2018  9:48 AM Oneal Grout wrote: CRM for notification. See Telephone encounter for: 04/13/18. 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 Form will be coming from insurance for his discount.

## 2018-04-19 DIAGNOSIS — M6281 Muscle weakness (generalized): Secondary | ICD-10-CM | POA: Diagnosis not present

## 2018-04-19 DIAGNOSIS — M545 Low back pain: Secondary | ICD-10-CM | POA: Diagnosis not present

## 2018-04-25 ENCOUNTER — Encounter: Payer: Self-pay | Admitting: Family Medicine

## 2018-04-25 ENCOUNTER — Ambulatory Visit (INDEPENDENT_AMBULATORY_CARE_PROVIDER_SITE_OTHER): Payer: BLUE CROSS/BLUE SHIELD | Admitting: Family Medicine

## 2018-04-25 VITALS — BP 104/76 | HR 91 | Temp 98.7°F | Ht 64.0 in | Wt 193.2 lb

## 2018-04-25 DIAGNOSIS — Z23 Encounter for immunization: Secondary | ICD-10-CM | POA: Diagnosis not present

## 2018-04-25 DIAGNOSIS — E669 Obesity, unspecified: Secondary | ICD-10-CM | POA: Diagnosis not present

## 2018-04-25 NOTE — Patient Instructions (Addendum)
Call the psych clinic about follow up and med options re: weight.  Flu shot today.   Keep the appointment with GI.  Take care.  Glad to see you.  Update me as needed.

## 2018-04-25 NOTE — Progress Notes (Signed)
D/w pt about BMI and insurance forms.  Forms signed and given to patient.  BMI 33.  He is on meds that contributed to his weight.  His diet is healthy and his exercise is limited by back pain.  He is in PT currently.  D/w pt about diet and exercise.    He has compelling reason to be on his current meds.  D/w pt.  His mood is better than off meds.  I'll defer to pt and psych.  He'll check with psych clinic.    We talked about GI referral.  He had h/o dark stools and some bleeding but all sx resolved with bowel regimen.  No FH colon cancer.  I encouraged him to keep OV with GI.    He is going to quit vaping, d/w pt.   Meds, vitals, and allergies reviewed.   ROS: Per HPI unless specifically indicated in ROS section   GEN: nad, alert and oriented HEENT: mucous membranes moist NECK: supple w/o LA CV: rrr.  PULM: ctab, no inc wob ABD: soft, +bs, normal BS EXT: no edema SKIN: no acute rash

## 2018-04-27 DIAGNOSIS — E669 Obesity, unspecified: Secondary | ICD-10-CM | POA: Insufficient documentation

## 2018-04-27 NOTE — Assessment & Plan Note (Signed)
BMI 33.  He is on meds that contributed to his weight gain.  His diet is healthy and his exercise is limited by back pain.  He is in PT currently.  D/w pt about diet and exercise.    He has compelling reason to be on his current meds.  D/w pt.  His mood is better than off meds.  I'll defer to pt and psych.  He'll check with psych clinic.    Continue work on diet and exercise in the meantime.  I did ask him to keep his follow-up with GI given his previous symptoms.  He does not have any symptoms now.

## 2018-05-04 ENCOUNTER — Encounter: Payer: Self-pay | Admitting: Family Medicine

## 2018-05-05 ENCOUNTER — Ambulatory Visit (INDEPENDENT_AMBULATORY_CARE_PROVIDER_SITE_OTHER): Payer: BLUE CROSS/BLUE SHIELD | Admitting: Internal Medicine

## 2018-05-05 ENCOUNTER — Other Ambulatory Visit: Payer: BLUE CROSS/BLUE SHIELD

## 2018-05-05 ENCOUNTER — Encounter: Payer: Self-pay | Admitting: Internal Medicine

## 2018-05-05 VITALS — BP 114/76 | HR 80 | Ht 64.5 in | Wt 192.0 lb

## 2018-05-05 DIAGNOSIS — K921 Melena: Secondary | ICD-10-CM | POA: Diagnosis not present

## 2018-05-05 DIAGNOSIS — K5903 Drug induced constipation: Secondary | ICD-10-CM

## 2018-05-05 DIAGNOSIS — K641 Second degree hemorrhoids: Secondary | ICD-10-CM | POA: Diagnosis not present

## 2018-05-05 DIAGNOSIS — T402X5A Adverse effect of other opioids, initial encounter: Secondary | ICD-10-CM | POA: Diagnosis not present
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
## 2018-05-05 NOTE — Patient Instructions (Addendum)
  Your provider has requested that you go to the basement level for lab work before leaving today. Press "B" on the elevator. The lab is located at the first door on the left as you exit the elevator.   Today we are giving you a handout to read on anal fissures and also one on hemorrhoids.   I appreciate the opportunity to care for you. Stan Head, MD, [facility]

## 2018-05-06 LAB — CBC WITH DIFFERENTIAL/PLATELET
BASOS: 0 %
Basophils Absolute: 0 10*3/uL (ref 0.0–0.2)
EOS (ABSOLUTE): 0.1 10*3/uL (ref 0.0–0.4)
EOS: 1 %
HEMATOCRIT: 47.6 % (ref 37.5–51.0)
HEMOGLOBIN: 16.1 g/dL (ref 13.0–17.7)
Immature Grans (Abs): 0 10*3/uL (ref 0.0–0.1)
Immature Granulocytes: 0 %
LYMPHS ABS: 3.4 10*3/uL — AB (ref 0.7–3.1)
Lymphs: 35 %
MCH: 31 pg (ref 26.6–33.0)
MCHC: 33.8 g/dL (ref 31.5–35.7)
MCV: 92 fL (ref 79–97)
MONOCYTES: 6 %
MONOS ABS: 0.6 10*3/uL (ref 0.1–0.9)
Neutrophils Absolute: 5.7 10*3/uL (ref 1.4–7.0)
Neutrophils: 58 %
Platelets: 307 10*3/uL (ref 150–450)
RBC: 5.19 x10E6/uL (ref 4.14–5.80)
RDW: 12.3 % (ref 12.3–15.4)
WBC: 9.9 10*3/uL (ref 3.4–10.8)

## 2018-05-08 ENCOUNTER — Other Ambulatory Visit: Payer: Self-pay | Admitting: Family Medicine

## 2018-05-11 ENCOUNTER — Encounter: Payer: Self-pay | Admitting: Emergency Medicine

## 2018-05-11 ENCOUNTER — Other Ambulatory Visit: Payer: Self-pay

## 2018-05-11 ENCOUNTER — Emergency Department
Admission: EM | Admit: 2018-05-11 | Discharge: 2018-05-11 | Disposition: A | Discharge status: Home / Self Care | Payer: BLUE CROSS/BLUE SHIELD | Attending: Emergency Medicine | Admitting: Emergency Medicine

## 2018-05-11 DIAGNOSIS — H6122 Impacted cerumen, left ear: Secondary | ICD-10-CM

## 2018-05-11 DIAGNOSIS — I10 Essential (primary) hypertension: Secondary | ICD-10-CM | POA: Diagnosis not present

## 2018-05-11 DIAGNOSIS — Z87891 Personal history of nicotine dependence: Secondary | ICD-10-CM | POA: Insufficient documentation

## 2018-05-11 DIAGNOSIS — E039 Hypothyroidism, unspecified: Secondary | ICD-10-CM | POA: Diagnosis not present

## 2018-05-11 DIAGNOSIS — H60502 Unspecified acute noninfective otitis externa, left ear: Secondary | ICD-10-CM | POA: Diagnosis not present

## 2018-05-11 DIAGNOSIS — Z79899 Other long term (current) drug therapy: Secondary | ICD-10-CM | POA: Diagnosis not present

## 2018-05-11 DIAGNOSIS — H9202 Otalgia, left ear: Secondary | ICD-10-CM | POA: Diagnosis not present

## 2018-05-11 MED ORDER — NEOMYCIN-POLYMYXIN-HC 3.5-10000-1 OT SUSP
4.0000 [drp] | Freq: Once | OTIC | Status: AC
  Administered 2018-05-11: 4 [drp] via OTIC
  Filled 2018-05-11: qty 10

## 2018-05-11 MED ORDER — NEOMYCIN-POLYMYXIN-HC 3.5-10000-1 OT SOLN: 3.0000 [drp] | Freq: Three times a day (TID) | OTIC | 0 refills | Status: AC

## 2018-05-11 MED ORDER — CARBAMIDE PEROXIDE 6.5 % OT SOLN: 5.0000 [drp] | Freq: Two times a day (BID) | OTIC | 2 refills | Status: AC

## 2018-05-11 MED ORDER — NEOMYCIN-POLYMYXIN-HC 1 % OT SOLN
4.0000 [drp] | Freq: Once | OTIC | Status: DC
  Filled 2018-05-11: qty 10

## 2018-05-11 MED ORDER — CARBAMIDE PEROXIDE 6.5 % OT SOLN
5.0000 [drp] | Freq: Once | OTIC | Status: AC
  Administered 2018-05-11: 5 [drp] via OTIC
  Filled 2018-05-11: qty 15

## 2018-05-11 NOTE — Progress Notes (Signed)
Normal CBC See My Chart message No further evaluation

## 2018-05-11 NOTE — ED Provider Notes (Signed)
[facility] Emergency Department Provider Note  ____________________________________________  Time seen: Approximately 10:45 PM  I have reviewed the triage vital signs and the nursing notes.   HISTORY  Chief Complaint Otalgia    HPI Cody [name] is a 47 y.o. male who presents the emergency department complaining of left ear pain, hearing loss in the left ear.  Patient reports that he had gradually increasing ear pain throughout the day.  He attempted to clean out his ear with a Q-tip but did not realize that the cotton had fallen off the Q-tip prior to it being inserted in his ear.  Patient reports after using Q-tip, pain had increased and he had no hearing loss to the left ear.  Patient reports that hearing loss is described as hearing underneath the water.  Everything is muffled.  Patient denies any drainage from the ear.  He reports that there is tenderness when palpating over the tragus.  No other complaint.  No recent URI symptoms.  No nasal congestion or sneezing.  Patient denies any other complaints at this time.    Past Medical History:  Diagnosis Date  . Anxiety   . Bipolar 2 disorder (HCC)   . Chronic back pain   . 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 Degenerative disk disease   . Depression   . Hyperlipidemia   . Hypertension    DENIES  . Hypothyroidism   . PTSD (post-traumatic stress disorder)    suicide attempt X1, gestureX 1    Patient Active Problem List   Diagnosis Date Noted  . Obesity with body mass index greater than 30 04/27/2018  . Healthcare maintenance 03/08/2018  . Abnormal stools 03/08/2018  . Knee pain 08/17/2017  . Advance care planning 09/15/2015  . LLQ pain 09/15/2015  . Low serum testosterone level 04/04/2011  . VITAMIN D DEFICIENCY 05/01/2010  . CIGARETTE SMOKER 05/01/2010  . SHORTNESS OF BREATH 04/29/2009  . HLD (hyperlipidemia) 04/01/2009  . ANXIETY 04/01/2009  .  Bipolar affective (HCC) 04/01/2009  . MEMORY LOSS 04/01/2009  . FASTING HYPERGLYCEMIA 04/01/2009  . ABNORMAL ELECTROCARDIOGRAM 04/01/2009  . Hypothyroidism 04/07/2007  . SYNDROME, CHRONIC PAIN 04/07/2007    Past Surgical History:  Procedure Laterality Date  . APPENDECTOMY     peritonitis  . INTRATHECAL PUMP IMPLANTATION  2007  . LAMINECTOMY  2004   arterial injury, transfused 7 units pc, implant surgery   . MANDIBLE FRACTURE SURGERY     mugged  . UPPER GASTROINTESTINAL ENDOSCOPY  2006   gastritis    Prior to Admission medications   Medication Sig Start Date End Date Taking? Authorizing Provider  AMBULATORY NON FORMULARY MEDICATION Morphine Pump Implant    [provider]  atorvastatin (LIPITOR) 10 MG tablet TAKE 1 TABLET BY MOUTH  DAILY 03/14/18   Joaquim Nam, MD  carbamide peroxide (DEBROX) 6.5 % OTIC solution Place 5 drops into the left ear 2 (two) times daily. 05/11/18 05/11/19  Zahlia Deshazer, Delorise Royals, PA-C  docusate sodium (COLACE) 100 MG capsule Take 100 mg by mouth daily.    [provider]  lamoTRIgine (LAMICTAL) 200 MG tablet Take 200 mg by mouth every evening.     [provider]  levothyroxine (SYNTHROID, LEVOTHROID) 75 MCG tablet TAKE 1 TABLET BY MOUTH  DAILY BEFORE BREAKFAST 01/17/18   Joaquim Nam, MD  lurasidone (LATUDA) 80 MG TABS tablet Take 80 mg by mouth at bedtime.  [provider]  neomycin-polymyxin-hydrocortisone (CORTISPORIN) OTIC solution Place 3 drops into the left ear 3 (three) times daily for 10 days. 05/11/18 05/21/18  Cristela Stalder, Delorise Royals, PA-C  omega-3 acid ethyl esters (LOVAZA) 1 g capsule TAKE 2 CAPSULE BY MOUTH  in morning 03/07/18   Joaquim Nam, MD  OVER THE COUNTER MEDICATION     [provider]  SAPHRIS 10 MG SUBL Take 2 tablets by mouth at bedtime. 02/01/18   [provider]  Tapentadol HCl (NUCYNTA) 100 MG TABS Take 100 mg by mouth 3 (three) times daily.     [provider]     Allergies Neurontin [gabapentin]; Vancomycin; Lithium; and Mirtazapine  Family History  Problem Relation Age of Onset  . Stroke Father 34  . Thyroid disease Father        hypo  . Ulcers Mother   . Depression Mother   . Thyroid disease Mother        hypo  . Lung cancer Maternal Grandmother   . Thyroid disease Maternal Grandmother        hypo  . Heart attack Maternal Grandfather 55  . Esophageal cancer Maternal Grandfather   . Colon cancer Neg Hx   . Diabetes Neg Hx   . Prostate cancer Neg Hx     Social History Social History   Tobacco Use  . Smoking status: Former Smoker    Packs/day: 0.25    Last attempt to quit: 08/10/2009    Years since quitting: 8.7  . Smokeless tobacco: Never Used  . Tobacco comment: smoked age intermittently age [age]; up to 2 cigarettes/ day   Substance Use Topics  . Alcohol use: No  . Drug use: No     Review of Systems  Constitutional: No fever/chills Eyes: No visual changes. No discharge Zenk: Positive for left ear pain, positive for hearing loss to the left ear. Cardiovascular: no chest pain. Respiratory: no cough. No SOB. Gastrointestinal: No abdominal pain.  No nausea, no vomiting.   Musculoskeletal: Negative for musculoskeletal pain. Skin: Negative for rash, abrasions, lacerations, ecchymosis. Neurological: Negative for headaches, focal weakness or numbness. 10-point ROS otherwise negative.  ____________________________________________   PHYSICAL EXAM:  VITAL SIGNS: ED Triage Vitals  Enc Vitals Group     BP 05/11/18 2158 110/83     Pulse Rate 05/11/18 2158 69     Resp 05/11/18 2158 18     Temp 05/11/18 2158 98.7 F (37.1 C)     Temp Source 05/11/18 2158 Oral     SpO2 05/11/18 2158 95 %     Weight 05/11/18 2159 185 lb (83.9 kg)     Height 05/11/18 2159 5' 4.5" (1.638 m)     Head Circumference --      Peak Flow --      Pain Score 05/11/18 2203 8     Pain Loc --      Pain Edu? --      Excl. in GC? --       Constitutional: Alert and oriented. Well appearing and in no acute distress. Eyes: Conjunctivae are normal. PERRL. EOMI. Head: Atraumatic. Antosh:      Ears: Visualization of the EAC and TM on right is unremarkable.  Visualization of the EAC on left reveals multiple abrasions consistent with Q-tip use.  Patient also has significant cerumen impaction.  TM is not visualized at this time.  Tender to palpation over the tragus.  No gross external erythema or edema.  No tenderness to palpation  over mastoids.      Nose: No congestion/rhinnorhea.      Mouth/Throat: Mucous membranes are moist.  Neck: No stridor.   Hematological/Lymphatic/Immunilogical: No cervical lymphadenopathy. Cardiovascular: Normal rate, regular rhythm. Normal S1 and S2.  Good peripheral circulation. Respiratory: Normal respiratory effort without tachypnea or retractions. Lungs CTAB. Good air entry to the bases with no decreased or absent breath sounds. Musculoskeletal: Full range of motion to all extremities. No gross deformities appreciated. Neurologic:  Normal speech and language. No gross focal neurologic deficits are appreciated.  Skin:  Skin is warm, dry and intact. No rash noted. Psychiatric: Mood and affect are normal. Speech and behavior are normal. Patient exhibits appropriate insight and judgement.   ____________________________________________   LABS (all labs ordered are listed, but only abnormal results are displayed)  Labs Reviewed - No data to display ____________________________________________  EKG   ____________________________________________  RADIOLOGY  No results found.  ____________________________________________    PROCEDURES  Procedure(s) performed:    Procedures    Medications  neomycin-polymyxin-hydrocortisone (CORTISPORIN) OTIC (EAR) suspension 4 drop (has no administration in time range)  carbamide peroxide (DEBROX) 6.5 % OTIC (EAR) solution 5 drop (5 drops Left EAR  Given 05/11/18 2306)     ____________________________________________   INITIAL IMPRESSION / ASSESSMENT AND PLAN / ED COURSE  Pertinent labs & imaging results that were available during my care of the patient were reviewed by me and considered in my medical decision making (see chart for details).  Review of the [place] CSRS was performed in accordance of the [facility] prior to dispensing any controlled drugs.      Patient's diagnosis is consistent with cerumen impaction, otitis externa.  Patient presents emergency department with increasing left ear pain, hearing loss.  On exam, patient has significant cerumen impaction.  Patient has multiple abrasions from Q-tip use.  Patient also has tenderness to palpation of the tragus.  Findings are consistent with cerumen impaction and noninfectious otitis externa.  Debrox is used for cerumen in the emergency department.  No instrumentation as patient is already caused minor trauma with Q-tip use.  Patient will be placed on Cortisporin, Debrox for improvement.  Patient is advised if symptoms worsen, do not improve to follow-up with [facility] or primary care. Patient is given ED precautions to return to the ED for any worsening or new symptoms.     ____________________________________________  FINAL CLINICAL IMPRESSION(S) / ED DIAGNOSES  Final diagnoses:  Impacted cerumen of left ear  Acute noninfective otitis externa of left ear, unspecified type      NEW MEDICATIONS STARTED DURING THIS VISIT:  ED Discharge Orders         Ordered    carbamide peroxide (DEBROX) 6.5 % OTIC solution  2 times daily     05/11/18 2319    neomycin-polymyxin-hydrocortisone (CORTISPORIN) OTIC solution  3 times daily     05/11/18 2319              This chart was dictated using voice recognition software/Dragon. Despite best efforts to proofread, errors can occur which can change the meaning. Any change was purely unintentional.    Racheal Patches, PA-C 05/11/18  2319    Jeanmarie Plant, MD 05/17/18 2227

## 2018-05-11 NOTE — ED Triage Notes (Signed)
Pt presents to ED with left ear pain and sudden deafness since around 1800 today. Concerned he had a build up of wax so he attempted to clean out his ear without success. No drainage from the ear.

## 2018-05-12 ENCOUNTER — Telehealth: Payer: Self-pay | Admitting: *Deleted

## 2018-05-12 DIAGNOSIS — H9209 Otalgia, unspecified ear: Secondary | ICD-10-CM

## 2018-05-12 NOTE — Telephone Encounter (Signed)
Copied from CRM [id number]. Topic: General - Other >> May 12, 2018 10:06 AM Percival Spanish wrote:  Pt call to req a referral to a [facility] after going to the ER  he has a ear infection and he can not hear out of it.

## 2018-05-12 NOTE — Telephone Encounter (Signed)
I put in the referral.  If it is going to take a long time for him to get an appointment at the [facility] then offer follow-up here.  Thanks.

## 2018-05-13 ENCOUNTER — Encounter: Payer: Self-pay | Admitting: Family Medicine

## 2018-05-13 ENCOUNTER — Ambulatory Visit (INDEPENDENT_AMBULATORY_CARE_PROVIDER_SITE_OTHER): Payer: BLUE CROSS/BLUE SHIELD | Admitting: Family Medicine

## 2018-05-13 VITALS — BP 92/62 | HR 75 | Temp 98.3°F | Ht 64.5 in | Wt 188.0 lb

## 2018-05-13 DIAGNOSIS — H6120 Impacted cerumen, unspecified ear: Secondary | ICD-10-CM

## 2018-05-13 NOTE — Progress Notes (Signed)
Follow-up regarding left ear.  He isn't lightheaded.  L ear affected, no R sided sx.  No fevers.  He has been using abx drops and debrox.  He doesn't feel sick but hearing is affected in the L ear. No pain now, that is clearly better.   Weber testing initially louder on the L, indicating intact function but likely muffling.    nad ncat L TM occluded with cerumen impaction.  Left pinna otherwise normal. Right TM, canal, pinna within normal limits.  Left cerumen impaction removed successfully with irrigation and curette.  No complication.  He immediately felt better.  Recheck after irrigation with B equal Weber testing.  Recheck canal and TM wnl B.

## 2018-05-13 NOTE — Patient Instructions (Addendum)
Cancel the [facility] appointment.  Irrigate as needed with warm water.  Use debrox in the future if needed.   Use the antibiotic drops for 2 more days.  Take care.  Glad to see you.

## 2018-05-13 NOTE — Telephone Encounter (Signed)
Appt was already made with you for today at 9:30am. Urgent Referral requested at [facility], they said the soonest they might be able to see him was the end of next week or the week after. Patient is aware that they will call him and has all the information, he will keep his Appt with you today.

## 2018-05-15 DIAGNOSIS — H6120 Impacted cerumen, unspecified ear: Secondary | ICD-10-CM | POA: Insufficient documentation

## 2018-05-15 NOTE — Assessment & Plan Note (Signed)
Left cerumen impaction removed successfully with irrigation and curette.  No complication.  He immediately felt better.  Recheck after irrigation with B equal Weber testing.  Recheck canal and TM wnl B.   He clearly felt better and his hearing was back to normal.  He can continue his antibiotic drops for 2 more days.  Use Debrox and home irrigation as needed.

## 2018-05-19 DIAGNOSIS — M47816 Spondylosis without myelopathy or radiculopathy, lumbar region: Secondary | ICD-10-CM | POA: Diagnosis not present

## 2018-05-19 DIAGNOSIS — G894 Chronic pain syndrome: Secondary | ICD-10-CM | POA: Diagnosis not present

## 2018-05-23 DIAGNOSIS — F3131 Bipolar disorder, current episode depressed, mild: Secondary | ICD-10-CM | POA: Diagnosis not present

## 2018-05-23 DIAGNOSIS — F411 Generalized anxiety disorder: Secondary | ICD-10-CM | POA: Diagnosis not present

## 2018-05-23 DIAGNOSIS — F1421 Cocaine dependence, in remission: Secondary | ICD-10-CM | POA: Diagnosis not present

## 2018-05-23 DIAGNOSIS — F5105 Insomnia due to other mental disorder: Secondary | ICD-10-CM | POA: Diagnosis not present

## 2018-06-06 ENCOUNTER — Other Ambulatory Visit: Payer: Self-pay | Admitting: Family Medicine

## 2018-06-30 DIAGNOSIS — Z978 Presence of other specified devices: Secondary | ICD-10-CM | POA: Diagnosis not present

## 2018-06-30 DIAGNOSIS — M47816 Spondylosis without myelopathy or radiculopathy, lumbar region: Secondary | ICD-10-CM | POA: Diagnosis not present

## 2018-06-30 DIAGNOSIS — M5417 Radiculopathy, lumbosacral region: Secondary | ICD-10-CM | POA: Diagnosis not present

## 2018-06-30 DIAGNOSIS — Z79899 Other long term (current) drug therapy: Secondary | ICD-10-CM | POA: Diagnosis not present

## 2018-06-30 DIAGNOSIS — G894 Chronic pain syndrome: Secondary | ICD-10-CM | POA: Diagnosis not present

## 2018-07-06 DIAGNOSIS — F5105 Insomnia due to other mental disorder: Secondary | ICD-10-CM | POA: Diagnosis not present

## 2018-07-06 DIAGNOSIS — F3131 Bipolar disorder, current episode depressed, mild: Secondary | ICD-10-CM | POA: Diagnosis not present

## 2018-07-06 DIAGNOSIS — F1421 Cocaine dependence, in remission: Secondary | ICD-10-CM | POA: Diagnosis not present

## 2018-07-06 DIAGNOSIS — F411 Generalized anxiety disorder: Secondary | ICD-10-CM | POA: Diagnosis not present

## 2018-08-11 DIAGNOSIS — M5417 Radiculopathy, lumbosacral region: Secondary | ICD-10-CM | POA: Diagnosis not present

## 2018-08-11 DIAGNOSIS — Z79899 Other long term (current) drug therapy: Secondary | ICD-10-CM | POA: Diagnosis not present

## 2018-08-11 DIAGNOSIS — Z978 Presence of other specified devices: Secondary | ICD-10-CM | POA: Diagnosis not present

## 2018-08-11 DIAGNOSIS — Z5181 Encounter for therapeutic drug level monitoring: Secondary | ICD-10-CM | POA: Diagnosis not present

## 2018-08-11 DIAGNOSIS — M545 Low back pain: Secondary | ICD-10-CM | POA: Diagnosis not present

## 2018-08-11 DIAGNOSIS — M47816 Spondylosis without myelopathy or radiculopathy, lumbar region: Secondary | ICD-10-CM | POA: Diagnosis not present

## 2018-08-11 DIAGNOSIS — G894 Chronic pain syndrome: Secondary | ICD-10-CM | POA: Diagnosis not present

## 2018-08-16 ENCOUNTER — Telehealth: Payer: Self-pay | Admitting: *Deleted

## 2018-08-16 NOTE — Telephone Encounter (Signed)
PA submitted thru CMM for Lovaza, awaiting response.

## 2018-08-25 DIAGNOSIS — F1421 Cocaine dependence, in remission: Secondary | ICD-10-CM | POA: Diagnosis not present

## 2018-08-25 DIAGNOSIS — F411 Generalized anxiety disorder: Secondary | ICD-10-CM | POA: Diagnosis not present

## 2018-08-25 DIAGNOSIS — F3131 Bipolar disorder, current episode depressed, mild: Secondary | ICD-10-CM | POA: Diagnosis not present

## 2018-08-25 DIAGNOSIS — F5105 Insomnia due to other mental disorder: Secondary | ICD-10-CM | POA: Diagnosis not present

## 2018-09-06 DIAGNOSIS — M545 Low back pain: Secondary | ICD-10-CM | POA: Diagnosis not present

## 2018-09-06 DIAGNOSIS — M6281 Muscle weakness (generalized): Secondary | ICD-10-CM | POA: Diagnosis not present

## 2018-09-08 DIAGNOSIS — F3132 Bipolar disorder, current episode depressed, moderate: Secondary | ICD-10-CM | POA: Diagnosis not present

## 2018-09-08 DIAGNOSIS — F413 Other mixed anxiety disorders: Secondary | ICD-10-CM | POA: Diagnosis not present

## 2018-09-08 DIAGNOSIS — F1421 Cocaine dependence, in remission: Secondary | ICD-10-CM | POA: Diagnosis not present

## 2018-09-14 DIAGNOSIS — M6281 Muscle weakness (generalized): Secondary | ICD-10-CM | POA: Diagnosis not present

## 2018-09-14 DIAGNOSIS — M545 Low back pain: Secondary | ICD-10-CM | POA: Diagnosis not present

## 2018-09-21 DIAGNOSIS — F5105 Insomnia due to other mental disorder: Secondary | ICD-10-CM | POA: Diagnosis not present

## 2018-09-21 DIAGNOSIS — F1421 Cocaine dependence, in remission: Secondary | ICD-10-CM | POA: Diagnosis not present

## 2018-09-21 DIAGNOSIS — F411 Generalized anxiety disorder: Secondary | ICD-10-CM | POA: Diagnosis not present

## 2018-09-21 DIAGNOSIS — F3131 Bipolar disorder, current episode depressed, mild: Secondary | ICD-10-CM | POA: Diagnosis not present

## 2018-09-26 DIAGNOSIS — M5417 Radiculopathy, lumbosacral region: Secondary | ICD-10-CM | POA: Diagnosis not present

## 2018-09-26 DIAGNOSIS — M546 Pain in thoracic spine: Secondary | ICD-10-CM | POA: Diagnosis not present

## 2018-09-26 DIAGNOSIS — Z978 Presence of other specified devices: Secondary | ICD-10-CM | POA: Diagnosis not present

## 2018-09-26 DIAGNOSIS — G894 Chronic pain syndrome: Secondary | ICD-10-CM | POA: Diagnosis not present

## 2018-10-04 DIAGNOSIS — F413 Other mixed anxiety disorders: Secondary | ICD-10-CM | POA: Diagnosis not present

## 2018-10-04 DIAGNOSIS — F1421 Cocaine dependence, in remission: Secondary | ICD-10-CM | POA: Diagnosis not present

## 2018-10-04 DIAGNOSIS — F3132 Bipolar disorder, current episode depressed, moderate: Secondary | ICD-10-CM | POA: Diagnosis not present

## 2018-11-09 DIAGNOSIS — Z978 Presence of other specified devices: Secondary | ICD-10-CM | POA: Diagnosis not present

## 2018-11-09 DIAGNOSIS — M545 Low back pain: Secondary | ICD-10-CM | POA: Diagnosis not present

## 2018-11-09 DIAGNOSIS — G894 Chronic pain syndrome: Secondary | ICD-10-CM | POA: Diagnosis not present

## 2018-11-16 DIAGNOSIS — F5105 Insomnia due to other mental disorder: Secondary | ICD-10-CM | POA: Diagnosis not present

## 2018-11-16 DIAGNOSIS — F411 Generalized anxiety disorder: Secondary | ICD-10-CM | POA: Diagnosis not present

## 2018-11-16 DIAGNOSIS — F1421 Cocaine dependence, in remission: Secondary | ICD-10-CM | POA: Diagnosis not present

## 2018-11-16 DIAGNOSIS — F3131 Bipolar disorder, current episode depressed, mild: Secondary | ICD-10-CM | POA: Diagnosis not present

## 2018-11-22 ENCOUNTER — Other Ambulatory Visit: Payer: Self-pay | Admitting: Family Medicine

## 2018-12-21 DIAGNOSIS — M5417 Radiculopathy, lumbosacral region: Secondary | ICD-10-CM | POA: Diagnosis not present

## 2018-12-21 DIAGNOSIS — M47816 Spondylosis without myelopathy or radiculopathy, lumbar region: Secondary | ICD-10-CM | POA: Diagnosis not present

## 2018-12-21 DIAGNOSIS — G894 Chronic pain syndrome: Secondary | ICD-10-CM | POA: Diagnosis not present

## 2018-12-21 DIAGNOSIS — M545 Low back pain: Secondary | ICD-10-CM | POA: Diagnosis not present

## 2019-01-17 ENCOUNTER — Other Ambulatory Visit: Payer: Self-pay | Admitting: Family Medicine

## 2019-01-17 DIAGNOSIS — E785 Hyperlipidemia, unspecified: Secondary | ICD-10-CM

## 2019-02-02 DIAGNOSIS — M545 Low back pain: Secondary | ICD-10-CM | POA: Diagnosis not present

## 2019-02-02 DIAGNOSIS — Z978 Presence of other specified devices: Secondary | ICD-10-CM | POA: Diagnosis not present

## 2019-02-02 DIAGNOSIS — G8929 Other chronic pain: Secondary | ICD-10-CM | POA: Diagnosis not present

## 2019-02-02 DIAGNOSIS — G894 Chronic pain syndrome: Secondary | ICD-10-CM | POA: Diagnosis not present

## 2019-02-02 DIAGNOSIS — Z5181 Encounter for therapeutic drug level monitoring: Secondary | ICD-10-CM | POA: Diagnosis not present

## 2019-02-02 DIAGNOSIS — Z79899 Other long term (current) drug therapy: Secondary | ICD-10-CM | POA: Diagnosis not present

## 2019-02-05 DIAGNOSIS — F411 Generalized anxiety disorder: Secondary | ICD-10-CM | POA: Diagnosis not present

## 2019-02-05 DIAGNOSIS — F5105 Insomnia due to other mental disorder: Secondary | ICD-10-CM | POA: Diagnosis not present

## 2019-02-05 DIAGNOSIS — F3131 Bipolar disorder, current episode depressed, mild: Secondary | ICD-10-CM | POA: Diagnosis not present

## 2019-02-05 DIAGNOSIS — F1421 Cocaine dependence, in remission: Secondary | ICD-10-CM | POA: Diagnosis not present

## 2019-03-02 ENCOUNTER — Ambulatory Visit: Payer: Medicare Other

## 2019-03-08 DIAGNOSIS — F1421 Cocaine dependence, in remission: Secondary | ICD-10-CM | POA: Diagnosis not present

## 2019-03-08 DIAGNOSIS — F3131 Bipolar disorder, current episode depressed, mild: Secondary | ICD-10-CM | POA: Diagnosis not present

## 2019-03-08 DIAGNOSIS — F411 Generalized anxiety disorder: Secondary | ICD-10-CM | POA: Diagnosis not present

## 2019-03-08 DIAGNOSIS — F5105 Insomnia due to other mental disorder: Secondary | ICD-10-CM | POA: Diagnosis not present

## 2019-03-10 ENCOUNTER — Encounter: Payer: Medicare Other | Admitting: Family Medicine

## 2019-03-16 ENCOUNTER — Other Ambulatory Visit: Payer: Self-pay | Admitting: Family Medicine

## 2019-03-16 DIAGNOSIS — E785 Hyperlipidemia, unspecified: Secondary | ICD-10-CM

## 2019-03-20 DIAGNOSIS — M5417 Radiculopathy, lumbosacral region: Secondary | ICD-10-CM | POA: Diagnosis not present

## 2019-03-20 DIAGNOSIS — Z978 Presence of other specified devices: Secondary | ICD-10-CM | POA: Diagnosis not present

## 2019-03-20 DIAGNOSIS — M5441 Lumbago with sciatica, right side: Secondary | ICD-10-CM | POA: Diagnosis not present

## 2019-03-20 DIAGNOSIS — G894 Chronic pain syndrome: Secondary | ICD-10-CM | POA: Diagnosis not present

## 2019-03-27 ENCOUNTER — Other Ambulatory Visit: Payer: Self-pay | Admitting: Family Medicine

## 2019-03-29 ENCOUNTER — Telehealth: Payer: Self-pay

## 2019-03-29 NOTE — Telephone Encounter (Signed)
PA submitted through Cover my meds on Lovaza. Awaiting response

## 2019-03-31 ENCOUNTER — Encounter: Payer: Medicare Other | Admitting: Family Medicine

## 2019-04-04 ENCOUNTER — Ambulatory Visit (INDEPENDENT_AMBULATORY_CARE_PROVIDER_SITE_OTHER): Payer: BC Managed Care – PPO | Admitting: Family Medicine

## 2019-04-04 ENCOUNTER — Other Ambulatory Visit: Payer: Self-pay

## 2019-04-04 ENCOUNTER — Encounter: Payer: Self-pay | Admitting: Family Medicine

## 2019-04-04 VITALS — BP 110/76 | HR 90 | Temp 98.0°F | Ht 64.5 in | Wt 182.4 lb

## 2019-04-04 DIAGNOSIS — K148 Other diseases of tongue: Secondary | ICD-10-CM

## 2019-04-04 DIAGNOSIS — Z0000 Encounter for general adult medical examination without abnormal findings: Secondary | ICD-10-CM | POA: Diagnosis not present

## 2019-04-04 DIAGNOSIS — E039 Hypothyroidism, unspecified: Secondary | ICD-10-CM

## 2019-04-04 DIAGNOSIS — G894 Chronic pain syndrome: Secondary | ICD-10-CM

## 2019-04-04 DIAGNOSIS — Z7189 Other specified counseling: Secondary | ICD-10-CM

## 2019-04-04 DIAGNOSIS — F3170 Bipolar disorder, currently in remission, most recent episode unspecified: Secondary | ICD-10-CM

## 2019-04-04 DIAGNOSIS — E785 Hyperlipidemia, unspecified: Secondary | ICD-10-CM

## 2019-04-04 DIAGNOSIS — R195 Other fecal abnormalities: Secondary | ICD-10-CM

## 2019-04-04 MED ORDER — ESCITALOPRAM OXALATE 20 MG PO TABS: 20.0000 mg | ORAL_TABLET | Freq: Every day | ORAL | Status: DC

## 2019-04-04 MED ORDER — QUETIAPINE FUMARATE 100 MG PO TABS: 200.0000 mg | ORAL_TABLET | Freq: Every day | ORAL | Status: DC

## 2019-04-04 MED ORDER — LEVOTHYROXINE SODIUM 75 MCG PO TABS: ORAL_TABLET | ORAL | 3 refills | Status: DC

## 2019-04-04 MED ORDER — LAMOTRIGINE 200 MG PO TABS: 200.0000 mg | ORAL_TABLET | Freq: Two times a day (BID) | ORAL | Status: DC

## 2019-04-04 MED ORDER — BUSPIRONE HCL 10 MG PO TABS: 10.0000 mg | ORAL_TABLET | Freq: Every day | ORAL | Status: DC

## 2019-04-04 NOTE — Patient Instructions (Addendum)
Go to the lab on the way out.  We'll contact you with your lab report. I would get a flu shot each fall.   Try gas X and update me as needed.  If the tongue irritation doesn't heal then let me know next week.  Use an ice pack on the left side of your jaw.  Soft foods for now.  Take care.  Glad to see you.

## 2019-04-04 NOTE — Progress Notes (Signed)
CPE- See plan.  Routine anticipatory guidance given to patient.  See health maintenance.  The possibility exists that previously documented standard health maintenance information may have been brought forward from a previous encounter into this note.  If needed, that same information has been updated to reflect the current situation based on today's encounter.    Tetanus <10 years per patient report.   Flu usually done at pharmacy.   PNA and shingles not due Prostate and colon cancer screening not due.  Living will d/w pt.  Wife designated if patient were incapacitated.   Elevated Cholesterol: Using medications without problems: yes Muscle aches: Discussed with patient, likely not from statin Diet compliance: doing well.  Exercise: encouraged as tolerated by back pain.    Some occ transient abd bloating noted.  D/w pt about monitoring and avoiding gas producing foods.  No black stools.  He has some hard stools.  D/w pt that he could try gas X as needed and update me as needed.  L side of tongue with whitish ulcer.  Also some L TMJ area pain.  All noted in the last week or so.    Hypothyroidism.  No ADE on med.  No dysphagia unless a really big bite, cautions d/w pt.  No neck mass.   Psych meds per Dr. Nicolasa Ducking.   Still seeing pain clinic at baseline.    He has support from his wife and his clinics.    PMH and SH reviewed Meds, vitals, and allergies reviewed.   ROS: Per HPI.  Unless specifically indicated otherwise in HPI, the patient denies:  General: fever. Eyes: acute vision changes Vanwey: sore throat Cardiovascular: chest pain Respiratory: SOB GI: vomiting GU: dysuria Musculoskeletal: acute back pain Derm: acute rash Neuro: acute motor dysfunction Psych: worsening mood Endocrine: polydipsia Heme: bleeding Allergy: hayfever  GEN: nad, alert and oriented HEENT: mucous membranes moist L TMJ ttp.  Small superficial L tongue lesion, along the edge of the tongue where it  would be adjacent to the teeth. NECK: supple w/o LA CV: rrr. PULM: ctab, no inc wob ABD: soft, +bs, not tender to palpation. EXT: no edema SKIN: no acute rash

## 2019-04-05 DIAGNOSIS — K148 Other diseases of tongue: Secondary | ICD-10-CM | POA: Insufficient documentation

## 2019-04-05 LAB — COMPREHENSIVE METABOLIC PANEL
ALT: 43 IU/L (ref 0–44)
AST: 31 IU/L (ref 0–40)
Albumin/Globulin Ratio: 2.1 (ref 1.2–2.2)
Albumin: 4.8 g/dL (ref 4.0–5.0)
Alkaline Phosphatase: 76 IU/L (ref 39–117)
BUN/Creatinine Ratio: 8 — ABNORMAL LOW (ref 9–20)
BUN: 10 mg/dL (ref 6–24)
Bilirubin Total: <0.2 mg/dL (ref 0.0–1.2)
CO2: 23 mmol/L (ref 20–29)
Calcium: 9.4 mg/dL (ref 8.7–10.2)
Chloride: 100 mmol/L (ref 96–106)
Creatinine, Ser: 1.22 mg/dL (ref 0.76–1.27)
GFR calc Af Amer: 81 mL/min/{1.73_m2} (ref >59)
GFR calc non Af Amer: 70 mL/min/{1.73_m2} (ref >59)
Globulin, Total: 2.3 g/dL (ref 1.5–4.5)
Glucose: 105 mg/dL — ABNORMAL HIGH (ref 65–99)
Potassium: 4.4 mmol/L (ref 3.5–5.2)
Sodium: 140 mmol/L (ref 134–144)
Total Protein: 7.1 g/dL (ref 6.0–8.5)

## 2019-04-05 LAB — CBC WITH DIFFERENTIAL/PLATELET
Basophils Absolute: 0.1 10*3/uL (ref 0.0–0.2)
Basos: 1 %
EOS (ABSOLUTE): 0.3 10*3/uL (ref 0.0–0.4)
Eos: 4 %
Hematocrit: 42.6 % (ref 37.5–51.0)
Hemoglobin: 14.7 g/dL (ref 13.0–17.7)
Immature Grans (Abs): 0 10*3/uL (ref 0.0–0.1)
Immature Granulocytes: 0 %
Lymphocytes Absolute: 3 10*3/uL (ref 0.7–3.1)
Lymphs: 35 %
MCH: 31.2 pg (ref 26.6–33.0)
MCHC: 34.5 g/dL (ref 31.5–35.7)
MCV: 90 fL (ref 79–97)
Monocytes Absolute: 0.6 10*3/uL (ref 0.1–0.9)
Monocytes: 7 %
Neutrophils Absolute: 4.5 10*3/uL (ref 1.4–7.0)
Neutrophils: 53 %
Platelets: 275 10*3/uL (ref 150–450)
RBC: 4.71 x10E6/uL (ref 4.14–5.80)
RDW: 11.8 % (ref 11.6–15.4)
WBC: 8.4 10*3/uL (ref 3.4–10.8)

## 2019-04-05 LAB — TSH: TSH: 2.8 u[IU]/mL (ref 0.450–4.500)

## 2019-04-05 LAB — LIPID PANEL
Chol/HDL Ratio: 5 ratio (ref 0.0–5.0)
Cholesterol, Total: 166 mg/dL (ref 100–199)
HDL: 33 mg/dL — ABNORMAL LOW (ref >39)
LDL Calculated: 88 mg/dL (ref 0–99)
Triglycerides: 225 mg/dL — ABNORMAL HIGH (ref 0–149)
VLDL Cholesterol Cal: 45 mg/dL — ABNORMAL HIGH (ref 5–40)

## 2019-04-05 NOTE — Assessment & Plan Note (Signed)
This looks like he could have bitten the left side of his tongue.  It may be that he has been trying to avoid that and chewing differently and that could have caused the TMJ pain.  I would observe the tongue lesion for now and have him ice his left TMJ.  If the tongue lesion does not resolve or if he continues to have TMJ pain then we can send him to [facility].  He does not have any alarming findings on exam otherwise.  He will update me as needed.  He agrees with plan.

## 2019-04-05 NOTE — Assessment & Plan Note (Signed)
Per pain clinic.  I will defer.  He agrees.

## 2019-04-05 NOTE — Assessment & Plan Note (Signed)
Hypothyroidism.  No ADE on med.  No dysphagia unless taking a really big bite, cautions d/w pt.  No neck mass.  See notes on labs.

## 2019-04-05 NOTE — Assessment & Plan Note (Signed)
Tetanus <10 years per patient report.   Flu usually done at pharmacy.   PNA and shingles not due Prostate and colon cancer screening not due.  Living will d/w pt.  Wife designated if patient were incapacitated.

## 2019-04-05 NOTE — Assessment & Plan Note (Signed)
Able to tolerate statin.  See notes on labs.  See above.

## 2019-04-05 NOTE — Assessment & Plan Note (Signed)
Some occ transient abd bloating noted.  D/w pt about monitoring and avoiding gas producing foods.  No black stools.  He has some hard stools.  D/w pt that he could try gas X as needed and update me as needed.

## 2019-04-05 NOTE — Assessment & Plan Note (Signed)
Living will d/w pt.  Wife designated if patient were incapacitated.   ?

## 2019-04-05 NOTE — Assessment & Plan Note (Signed)
Per psychiatry.  I will defer.  He agrees.

## 2019-05-03 DIAGNOSIS — M546 Pain in thoracic spine: Secondary | ICD-10-CM | POA: Diagnosis not present

## 2019-05-03 DIAGNOSIS — G894 Chronic pain syndrome: Secondary | ICD-10-CM | POA: Diagnosis not present

## 2019-05-03 DIAGNOSIS — M5417 Radiculopathy, lumbosacral region: Secondary | ICD-10-CM | POA: Diagnosis not present

## 2019-05-03 DIAGNOSIS — M5441 Lumbago with sciatica, right side: Secondary | ICD-10-CM | POA: Diagnosis not present

## 2019-05-16 ENCOUNTER — Other Ambulatory Visit: Payer: Self-pay

## 2019-05-16 ENCOUNTER — Telehealth: Payer: Self-pay

## 2019-05-16 ENCOUNTER — Emergency Department
Admission: EM | Admit: 2019-05-16 | Discharge: 2019-05-16 | Disposition: A | Discharge status: Home / Self Care | Payer: BC Managed Care – PPO | Attending: Emergency Medicine | Admitting: Emergency Medicine

## 2019-05-16 ENCOUNTER — Encounter: Payer: Self-pay | Admitting: Emergency Medicine

## 2019-05-16 ENCOUNTER — Emergency Department: Payer: BC Managed Care – PPO

## 2019-05-16 DIAGNOSIS — G894 Chronic pain syndrome: Secondary | ICD-10-CM | POA: Diagnosis present

## 2019-05-16 DIAGNOSIS — R9431 Abnormal electrocardiogram [ECG] [EKG]: Secondary | ICD-10-CM | POA: Diagnosis present

## 2019-05-16 DIAGNOSIS — K562 Volvulus: Secondary | ICD-10-CM | POA: Diagnosis not present

## 2019-05-16 DIAGNOSIS — R7309 Other abnormal glucose: Secondary | ICD-10-CM | POA: Diagnosis present

## 2019-05-16 DIAGNOSIS — K148 Other diseases of tongue: Secondary | ICD-10-CM | POA: Diagnosis present

## 2019-05-16 DIAGNOSIS — R195 Other fecal abnormalities: Secondary | ICD-10-CM | POA: Diagnosis present

## 2019-05-16 DIAGNOSIS — F419 Anxiety disorder, unspecified: Secondary | ICD-10-CM | POA: Insufficient documentation

## 2019-05-16 DIAGNOSIS — R410 Disorientation, unspecified: Secondary | ICD-10-CM | POA: Diagnosis not present

## 2019-05-16 DIAGNOSIS — R1084 Generalized abdominal pain: Secondary | ICD-10-CM | POA: Diagnosis not present

## 2019-05-16 DIAGNOSIS — R7989 Other specified abnormal findings of blood chemistry: Secondary | ICD-10-CM | POA: Diagnosis present

## 2019-05-16 DIAGNOSIS — Z79899 Other long term (current) drug therapy: Secondary | ICD-10-CM | POA: Diagnosis not present

## 2019-05-16 DIAGNOSIS — R1032 Left lower quadrant pain: Secondary | ICD-10-CM | POA: Diagnosis present

## 2019-05-16 DIAGNOSIS — Z0000 Encounter for general adult medical examination without abnormal findings: Secondary | ICD-10-CM

## 2019-05-16 DIAGNOSIS — R0602 Shortness of breath: Secondary | ICD-10-CM | POA: Diagnosis present

## 2019-05-16 DIAGNOSIS — Z7189 Other specified counseling: Secondary | ICD-10-CM

## 2019-05-16 DIAGNOSIS — Z87891 Personal history of nicotine dependence: Secondary | ICD-10-CM | POA: Insufficient documentation

## 2019-05-16 DIAGNOSIS — R4182 Altered mental status, unspecified: Secondary | ICD-10-CM | POA: Diagnosis not present

## 2019-05-16 DIAGNOSIS — E559 Vitamin D deficiency, unspecified: Secondary | ICD-10-CM | POA: Diagnosis present

## 2019-05-16 DIAGNOSIS — E785 Hyperlipidemia, unspecified: Secondary | ICD-10-CM | POA: Diagnosis present

## 2019-05-16 DIAGNOSIS — E039 Hypothyroidism, unspecified: Secondary | ICD-10-CM | POA: Diagnosis present

## 2019-05-16 DIAGNOSIS — F17200 Nicotine dependence, unspecified, uncomplicated: Secondary | ICD-10-CM | POA: Diagnosis present

## 2019-05-16 DIAGNOSIS — K5900 Constipation, unspecified: Secondary | ICD-10-CM

## 2019-05-16 DIAGNOSIS — F319 Bipolar disorder, unspecified: Secondary | ICD-10-CM | POA: Diagnosis present

## 2019-05-16 DIAGNOSIS — R109 Unspecified abdominal pain: Secondary | ICD-10-CM

## 2019-05-16 DIAGNOSIS — M25569 Pain in unspecified knee: Secondary | ICD-10-CM | POA: Diagnosis present

## 2019-05-16 DIAGNOSIS — E669 Obesity, unspecified: Secondary | ICD-10-CM | POA: Diagnosis present

## 2019-05-16 DIAGNOSIS — F411 Generalized anxiety disorder: Secondary | ICD-10-CM | POA: Diagnosis present

## 2019-05-16 DIAGNOSIS — I776 Arteritis, unspecified: Secondary | ICD-10-CM

## 2019-05-16 LAB — URINE DRUG SCREEN, QUALITATIVE (ARMC ONLY)
Amphetamines, Ur Screen: NOT DETECTED
Barbiturates, Ur Screen: NOT DETECTED
Benzodiazepine, Ur Scrn: NOT DETECTED
Cannabinoid 50 Ng, Ur ~~LOC~~: POSITIVE — AB
Cocaine Metabolite,Ur ~~LOC~~: NOT DETECTED
MDMA (Ecstasy)Ur Screen: NOT DETECTED
Methadone Scn, Ur: POSITIVE — AB
Opiate, Ur Screen: POSITIVE — AB
Phencyclidine (PCP) Ur S: NOT DETECTED
Tricyclic, Ur Screen: NOT DETECTED

## 2019-05-16 LAB — COMPREHENSIVE METABOLIC PANEL
ALT: 47 U/L — ABNORMAL HIGH (ref 0–44)
AST: 34 U/L (ref 15–41)
Albumin: 5 g/dL (ref 3.5–5.0)
Alkaline Phosphatase: 61 U/L (ref 38–126)
Anion gap: 12 (ref 5–15)
BUN: 11 mg/dL (ref 6–20)
CO2: 28 mmol/L (ref 22–32)
Calcium: 9.3 mg/dL (ref 8.9–10.3)
Chloride: 100 mmol/L (ref 98–111)
Creatinine, Ser: 1.21 mg/dL (ref 0.61–1.24)
GFR calc Af Amer: >60 mL/min (ref >60)
GFR calc non Af Amer: >60 mL/min (ref >60)
Glucose, Bld: 95 mg/dL (ref 70–99)
Potassium: 3.9 mmol/L (ref 3.5–5.1)
Sodium: 140 mmol/L (ref 135–145)
Total Bilirubin: 0.7 mg/dL (ref 0.3–1.2)
Total Protein: 7.5 g/dL (ref 6.5–8.1)

## 2019-05-16 LAB — URINALYSIS, COMPLETE (UACMP) WITH MICROSCOPIC
Bacteria, UA: NONE SEEN
Bilirubin Urine: NEGATIVE
Glucose, UA: NEGATIVE mg/dL
Hgb urine dipstick: NEGATIVE
Ketones, ur: NEGATIVE mg/dL
Leukocytes,Ua: NEGATIVE
Nitrite: NEGATIVE
Protein, ur: NEGATIVE mg/dL
Specific Gravity, Urine: 1.01 (ref 1.005–1.030)
Squamous Epithelial / HPF: NONE SEEN (ref 0–5)
pH: 7 (ref 5.0–8.0)

## 2019-05-16 LAB — CBC
HCT: 42.6 % (ref 39.0–52.0)
Hemoglobin: 14.4 g/dL (ref 13.0–17.0)
MCH: 30.8 pg (ref 26.0–34.0)
MCHC: 33.8 g/dL (ref 30.0–36.0)
MCV: 91.2 fL (ref 80.0–100.0)
Platelets: 214 10*3/uL (ref 150–400)
RBC: 4.67 MIL/uL (ref 4.22–5.81)
RDW: 11.2 % — ABNORMAL LOW (ref 11.5–15.5)
WBC: 6.9 10*3/uL (ref 4.0–10.5)
nRBC: 0 % (ref 0.0–0.2)

## 2019-05-16 LAB — ETHANOL: Alcohol, Ethyl (B): <10 mg/dL (ref <10)

## 2019-05-16 LAB — LIPASE, BLOOD: Lipase: 36 U/L (ref 11–51)

## 2019-05-16 MED ORDER — SODIUM CHLORIDE 0.9 % IV BOLUS
1000.0000 mL | Freq: Once | INTRAVENOUS | Status: AC
  Administered 2019-05-16: 16:00:00 1000 mL via INTRAVENOUS

## 2019-05-16 MED ORDER — HYDROCODONE-ACETAMINOPHEN 5-325 MG PO TABS
1.0000 | ORAL_TABLET | Freq: Once | ORAL | Status: AC
  Administered 2019-05-16: 1 via ORAL
  Filled 2019-05-16: qty 1

## 2019-05-16 MED ORDER — HYDROCODONE-ACETAMINOPHEN 5-325 MG PO TABS
1.0000 | ORAL_TABLET | Freq: Once | ORAL | Status: AC
  Administered 2019-05-16: 16:00:00 1 via ORAL
  Filled 2019-05-16: qty 1

## 2019-05-16 MED ORDER — POLYETHYLENE GLYCOL 3350 17 GM/SCOOP PO POWD: 17.0000 g | Freq: Every day | ORAL | 0 refills | Status: DC | PRN

## 2019-05-16 NOTE — ED Provider Notes (Signed)
[facility] Emergency Department Provider Note  Time seen: 3:57 PM  I have reviewed the triage vital signs and the nursing notes.   HISTORY  Chief Complaint Altered Mental Status   HPI Cody [name] is a 48 y.o. male with a past medical history of anxiety, bipolar, chronic back pain, depression, hyperlipidemia, presents to the emergency department for worsening anxiety and ADHD symptoms per patient.  According to the patient over the past several weeks he has noticed that he has been feeling very anxious, states he is having trouble staying focused and feels like his thoughts are racing at times.  Patient denies any fever cough or shortness of breath.  Denies any nausea vomiting or diarrhea dysuria or hematuria.  Patient states he has been experiencing abdominal pain but this has been going on for years per patient.  Does not know when his last bowel movement was.   Past Medical History:  Diagnosis Date  . Anxiety   . Bipolar 2 disorder (HCC)   . Chronic back pain   . 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 Degenerative disk disease   . Depression   . Hyperlipidemia   . Hypothyroidism   . PTSD (post-traumatic stress disorder)    suicide attempt X1, gestureX 1    Patient Active Problem List   Diagnosis Date Noted  . Tongue lesion 04/05/2019  . Obesity with body mass index greater than 30 04/27/2018  . Routine general medical examination at a health care facility 03/08/2018  . Abnormal stools 03/08/2018  . Knee pain 08/17/2017  . Advance care planning 09/15/2015  . LLQ pain 09/15/2015  . Low serum testosterone level 04/04/2011  . VITAMIN D DEFICIENCY 05/01/2010  . CIGARETTE SMOKER 05/01/2010  . SHORTNESS OF BREATH 04/29/2009  . HLD (hyperlipidemia) 04/01/2009  . ANXIETY 04/01/2009  . Bipolar affective (HCC) 04/01/2009  . FASTING HYPERGLYCEMIA 04/01/2009  . ABNORMAL ELECTROCARDIOGRAM 04/01/2009  .  Hypothyroidism 04/07/2007  . SYNDROME, CHRONIC PAIN 04/07/2007    Past Surgical History:  Procedure Laterality Date  . APPENDECTOMY     peritonitis  . INTRATHECAL PUMP IMPLANTATION  2007  . LAMINECTOMY  2004   arterial injury, transfused 7 units pc, implant surgery   . MANDIBLE FRACTURE SURGERY     mugged  . UPPER GASTROINTESTINAL ENDOSCOPY  2006   gastritis    Prior to Admission medications   Medication Sig Start Date End Date Taking? Authorizing Provider  AMBULATORY NON FORMULARY MEDICATION Morphine Pump Implant    [provider]  atorvastatin (LIPITOR) 10 MG tablet Take 1 tablet (10 mg total) by mouth daily. Keep follow up appointment in August 03/16/19   Joaquim Namuncan, Graham S, MD  busPIRone (BUSPAR) 10 MG tablet Take 1 tablet (10 mg total) by mouth daily. 04/04/19   Joaquim Namuncan, Graham S, MD  docusate sodium (COLACE) 100 MG capsule Take 100 mg by mouth daily.    [provider]  escitalopram (LEXAPRO) 20 MG tablet Take 1 tablet (20 mg total) by mouth daily. 04/04/19   Joaquim Namuncan, Graham S, MD  lamoTRIgine (LAMICTAL) 200 MG tablet Take 1 tablet (200 mg total) by mouth 2 (two) times daily. 04/04/19   Joaquim Namuncan, Graham S, MD  levothyroxine (SYNTHROID) 75 MCG tablet TAKE 1 TABLET BY MOUTH  DAILY BEFORE BREAKFAST . 04/04/19   Joaquim Namuncan, Graham S, MD  OVER THE COUNTER MEDICATION     [provider]  QUEtiapine (SEROQUEL) 100  MG tablet Take 2 tablets (200 mg total) by mouth at bedtime. 04/04/19   Tonia Ghent, MD  Tapentadol HCl (NUCYNTA) 100 MG TABS Take 100 mg by mouth 3 (three) times daily.     [provider]    Allergies  Allergen Reactions  . Neurontin [Gabapentin]     lethargic  . Vancomycin     Swelling   . Lithium     tremor  . Mirtazapine     REACTION: lethargy ( Remeron)    Family History  Problem Relation Age of Onset  . Stroke Father 36  . Thyroid disease Father        hypo  . Ulcers Mother   . Depression Mother   . Thyroid disease Mother         hypo  . Lung cancer Maternal Grandmother   . Thyroid disease Maternal Grandmother        hypo  . Heart attack Maternal Grandfather 55  . Esophageal cancer Maternal Grandfather   . Colon cancer Neg Hx   . Diabetes Neg Hx   . Prostate cancer Neg Hx     Social History Social History   Tobacco Use  . Smoking status: Former Smoker    Packs/day: 0.25    Quit date: 08/10/2009    Years since quitting: 9.7  . Smokeless tobacco: Never Used  . Tobacco comment: smoked age intermittently age [age]; up to 2 cigarettes/ day   Substance Use Topics  . Alcohol use: No  . Drug use: No    Review of Systems Constitutional: Negative for fever. Cardiovascular: Negative for chest pain. Respiratory: Negative for shortness of breath. Gastrointestinal: Chronic abdominal pain.  Negative for vomiting. Genitourinary: Negative for urinary compaints Musculoskeletal: Chronic back pain. Skin: Negative for skin complaints  Neurological: Negative for headache All other ROS negative  ____________________________________________   PHYSICAL EXAM:  VITAL SIGNS: ED Triage Vitals  Enc Vitals Group     BP 05/16/19 1440 128/81     Pulse Rate 05/16/19 1440 81     Resp 05/16/19 1440 17     Temp 05/16/19 1440 98.3 F (36.8 C)     Temp Source 05/16/19 1440 Oral     SpO2 05/16/19 1440 97 %     Weight 05/16/19 1443 170 lb (77.1 kg)     Height 05/16/19 1443 5\' 5"  (1.651 m)     Head Circumference --      Peak Flow --      Pain Score 05/16/19 1443 7     Pain Loc --      Pain Edu? --      Excl. in [place]? --    Constitutional: Alert and oriented. Well appearing and in no distress. Eyes: Normal exam [place]      Head: Normocephalic and atraumatic.      Mouth/Throat: Mucous membranes are moist. Cardiovascular: Normal rate, regular rhythm. Respiratory: Normal respiratory effort without tachypnea nor retractions. Breath sounds are clear  Gastrointestinal: Soft and nontender. No distention.    Musculoskeletal: Nontender with normal range of motion in all extremities.  Neurologic:  Normal speech and language. No gross focal neurologic deficits  Skin:  Skin is warm, dry and intact.  Psychiatric: Patient is somewhat anxious appearing.     RADIOLOGY  Abdominal x-ray shows constipation otherwise negative.  CT scan head is negative.  ____________________________________________   INITIAL IMPRESSION / ASSESSMENT AND PLAN / ED COURSE  Pertinent labs & imaging results that were available during my  care of the patient were reviewed by me and considered in my medical decision making (see chart for details).   Patient presents emergency department for anxiety and feeling like his thoughts are racing.  Also states he has been experiencing abdominal pain but states this is been ongoing times years.  We will check labs and x-ray as a precaution.  We will consult psychiatry and have them evaluate the patient given his complaints of increased anxiety and racing thoughts.  Differential would include medication reaction, worsening anxiety or psychiatric condition, metabolic or electrolyte abnormality.  Patient's work-up in the emergency department is largely nonrevealing.  Patient has been seen by psychiatry of also discussed patient with Dr. Edwin Dada.  We will proceed with CT scan of the head to further evaluate.  Patient CT scan is negative.  With an overall normal work-up I believe the patient is safe for discharge home with outpatient follow-up with Dr. Edwin Dada.  I discussed this with the patient and his wife and they are agreeable to this plan of care.  Cody [name] was evaluated in Emergency Department on 05/16/2019 for the symptoms described in the history of present illness. He was evaluated in the context of the global COVID-19 pandemic, which necessitated consideration that the patient might be at risk for infection with the SARS-CoV-2 virus that causes COVID-19. Institutional protocols and  algorithms that pertain to the evaluation of patients at risk for COVID-19 are in a state of rapid change based on information released by regulatory bodies including the CDC and federal and state organizations. These policies and algorithms were followed during the patient's care in the ED.  ____________________________________________   FINAL CLINICAL IMPRESSION(S) / ED DIAGNOSES  Abdominal pain Anxiety   Minna Antis, MD 05/16/19 2217

## 2019-05-16 NOTE — ED Notes (Signed)
Family at bedside. 

## 2019-05-16 NOTE — Discharge Instructions (Addendum)
Please follow-up with your psychiatrist by calling her tomorrow to arrange a follow-up appointment.  Return to the emergency department for any symptoms personally concerning to yourself.

## 2019-05-16 NOTE — Telephone Encounter (Signed)
Agreed.  Thanks.  

## 2019-05-16 NOTE — Telephone Encounter (Signed)
pts wife called and was advised by psychiatrist to call PCP. Earlier this morning pt had confusion and could not follow a conversation; pt knew who he was but could not hold a conversation. Pt was having racing thoughts and dizziness on and off. pts wife said pt having symptoms on and off for 1 month but has worsened. Pt s wife spoke with him and asked a question about the confusion and racing thoughts last had this morning and pt answered yes and he was more concerned about the confusion more so than the dizziness. I then asked wife if dizziness was like room spinning around or just lightheaded. And in just a minute pts wife went to ask him and he was having another episode of not understanding what she was saying. Dr Damita Dunnings said pt should go to ED now for eval. pts wife declined 47 and she will take pt to [facility] ED now.

## 2019-05-16 NOTE — ED Triage Notes (Signed)
Patient presents to ED via POV from home with c/o anxiety and confusion. Patient has a history of bipolar. Denies SI/HI. Denies hallucinations. A&O x3, disoriented to time. Flight of ideas noted.

## 2019-05-16 NOTE — Consult Note (Signed)
[facility] Face-to-Face Psychiatry Consult   Reason for Consult: Altered mental status Referring Physician:  Dr. Lenard Lance Patient Identification: Cody [name] MRN:  [id number] Principal Diagnosis: <principal problem not specified> Diagnosis:  Active Problems:   Hypothyroidism   Vitamin D deficiency   HLD (hyperlipidemia)   Anxiety state   CIGARETTE SMOKER   Bipolar affective (HCC)   SYNDROME, CHRONIC PAIN   Shortness of breath   FASTING HYPERGLYCEMIA   ABNORMAL ELECTROCARDIOGRAM   Low serum testosterone level   Advance care planning   LLQ pain   Knee pain   Routine general medical examination at a health care facility   Abnormal stools   Obesity with body mass index greater than 30   Tongue lesion   Vasculitis (HCC)   Total Time spent with patient: 1 hour  Subjective:   Cody [name] is a 48 y.o. male patient admitted with   HPI: Per Dr. Linna Darner; Cody [name] is a 48 y.o. male with a past medical history of anxiety, bipolar, chronic back pain, depression, hyperlipidemia, presents to the emergency department for worsening anxiety and ADHD symptoms per patient.  According to the patient over the past several weeks he has noticed that he has been feeling very anxious, states he is having trouble staying focused and feels like his thoughts are racing at times.  Patient denies any fever cough or shortness of breath.  Denies any nausea vomiting or diarrhea dysuria or hematuria.  Patient states he has been experiencing abdominal pain but this has been going on for years per patient.  Does not know when his last bowel movement was.  Past Psychiatric History:  Anxiety Bipolar 2 disorder (HCC) Depression PTSD (post traumatic stress disorder) Suicide attempt x1, gesture x1  Risk to Self:  No Risk to Others:  No Prior Inpatient Therapy:  No Prior Outpatient Therapy:  Yes  Past Medical History:  Past Medical History:  Diagnosis Date  . Anxiety   . Bipolar 2 disorder (HCC)    . Chronic back pain   . 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 Degenerative disk disease   . Depression   . Hyperlipidemia   . Hypothyroidism   . PTSD (post-traumatic stress disorder)    suicide attempt X1, gestureX 1    Past Surgical History:  Procedure Laterality Date  . APPENDECTOMY     peritonitis  . INTRATHECAL PUMP IMPLANTATION  2007  . LAMINECTOMY  2004   arterial injury, transfused 7 units pc, implant surgery   . MANDIBLE FRACTURE SURGERY     mugged  . UPPER GASTROINTESTINAL ENDOSCOPY  2006   gastritis   Family History:  Family History  Problem Relation Age of Onset  . Stroke Father 66  . Thyroid disease Father        hypo  . Ulcers Mother   . Depression Mother   . Thyroid disease Mother        hypo  . Lung cancer Maternal Grandmother   . Thyroid disease Maternal Grandmother        hypo  . Heart attack Maternal Grandfather 55  . Esophageal cancer Maternal Grandfather   . Colon cancer Neg Hx   . Diabetes Neg Hx   . Prostate cancer Neg Hx    Family Psychiatric  History: No pertinent family psychiatric history Social History:  Social History   Substance and Sexual Activity  Alcohol Use No  Social History   Substance and Sexual Activity  Drug Use No    Social History   Socioeconomic History  . Marital status: Married    Spouse name: Not on file  . Number of children: 0  . Years of education: Not on file  . Highest education level: Not on file  Occupational History  . Occupation: Disabled  Social Needs  . Financial resource strain: Not on file  . Food insecurity    Worry: Not on file    Inability: Not on file  . Transportation needs    Medical: Not on file    Non-medical: Not on file  Tobacco Use  . Smoking status: Former Smoker    Packs/day: 0.25    Quit date: 08/10/2009    Years since quitting: 9.7  . Smokeless tobacco: Never Used  . Tobacco comment: smoked age intermittently age  [age]; up to 2 cigarettes/ day   Substance and Sexual Activity  . Alcohol use: No  . Drug use: No  . Sexual activity: Not on file  Lifestyle  . Physical activity    Days per week: Not on file    Minutes per session: Not on file  . Stress: Not on file  Relationships  . Social Musician on phone: Not on file    Gets together: Not on file    Attends religious service: Not on file    Active member of club or organization: Not on file    Attends meetings of clubs or organizations: Not on file    Relationship status: Not on file  Other Topics Concern  . Not on file  Social History Narrative   From [place].     [address] Grad   To [place] 2000   Disability from bipolar affective disorder, prev accounting.     Married 1997   No kids   Enjoys walking, travel.     Additional Social History:    Allergies:   Allergies  Allergen Reactions  . Neurontin [Gabapentin]     lethargic  . Vancomycin     Swelling   . Lithium     tremor  . Mirtazapine     REACTION: lethargy ( Remeron)    Labs:  Results for orders placed or performed during the hospital encounter of 05/16/19 (from the past 48 hour(s))  Urine Drug Screen, Qualitative ([facility] only)     Status: Abnormal   Collection Time: 05/16/19  3:34 PM  Result Value Ref Range   Tricyclic, Ur Screen NONE DETECTED NONE DETECTED   Amphetamines, Ur Screen NONE DETECTED NONE DETECTED   MDMA (Ecstasy)Ur Screen NONE DETECTED NONE DETECTED   Cocaine Metabolite,Ur [place] NONE DETECTED NONE DETECTED   Opiate, Ur Screen POSITIVE (A) NONE DETECTED   Phencyclidine (PCP) Ur S NONE DETECTED NONE DETECTED   Cannabinoid 50 Ng, Ur [place] POSITIVE (A) NONE DETECTED   Barbiturates, Ur Screen NONE DETECTED NONE DETECTED   Benzodiazepine, Ur Scrn NONE DETECTED NONE DETECTED   Methadone Scn, Ur POSITIVE (A) NONE DETECTED    Comment: (NOTE) Tricyclics + metabolites, urine    Cutoff 1000 ng/mL Amphetamines + metabolites, urine  Cutoff 1000  ng/mL MDMA (Ecstasy), urine              Cutoff 500 ng/mL Cocaine Metabolite, urine          Cutoff 300 ng/mL Opiate + metabolites, urine        Cutoff 300 ng/mL Phencyclidine (PCP), urine  Cutoff 25 ng/mL Cannabinoid, urine                 Cutoff 50 ng/mL Barbiturates + metabolites, urine  Cutoff 200 ng/mL Benzodiazepine, urine              Cutoff 200 ng/mL Methadone, urine                   Cutoff 300 ng/mL The urine drug screen provides only a preliminary, unconfirmed analytical test result and should not be used for non-medical purposes. Clinical consideration and professional judgment should be applied to any positive drug screen result due to possible interfering substances. A more specific alternate chemical method must be used in order to obtain a confirmed analytical result. Gas chromatography / mass spectrometry (GC/MS) is the preferred confirmat ory method. Performed at [facility], [address] [place], [place] [address]   CBC     Status: Abnormal   Collection Time: 05/16/19  3:42 PM  Result Value Ref Range   WBC 6.9 4.0 - 10.5 K/uL   RBC 4.67 4.22 - 5.81 MIL/uL   Hemoglobin 14.4 13.0 - 17.0 g/dL   HCT 91.442.6 78.239.0 - 95.652.0 %   MCV 91.2 80.0 - 100.0 fL   MCH 30.8 26.0 - 34.0 pg   MCHC 33.8 30.0 - 36.0 g/dL   RDW 21.311.2 (L) 08.611.5 - 57.815.5 %   Platelets 214 150 - 400 K/uL   nRBC 0.0 0.0 - 0.2 %    Comment: Performed at [facility], [address] [place], [place] [address]  Comprehensive metabolic panel     Status: Abnormal   Collection Time: 05/16/19  3:42 PM  Result Value Ref Range   Sodium 140 135 - 145 mmol/L   Potassium 3.9 3.5 - 5.1 mmol/L   Chloride 100 98 - 111 mmol/L   CO2 28 22 - 32 mmol/L   Glucose, Bld 95 70 - 99 mg/dL   BUN 11 6 - 20 mg/dL   Creatinine, Ser 9.521.21 0.61 - 1.24 mg/dL   Calcium 9.3 8.9 - 84.110.3 mg/dL   Total Protein 7.5 6.5 - 8.1 g/dL   Albumin 5.0 3.5 - 5.0 g/dL   AST 34 15 - 41 U/L   ALT 47 (H) 0 - 44 U/L    Alkaline Phosphatase 61 38 - 126 U/L   Total Bilirubin 0.7 0.3 - 1.2 mg/dL   GFR calc non Af Amer >60 >60 mL/min   GFR calc Af Amer >60 >60 mL/min   Anion gap 12 5 - 15    Comment: Performed at [facility], [address] [place], [place] [address]  Lipase, blood     Status: None   Collection Time: 05/16/19  3:42 PM  Result Value Ref Range   Lipase 36 11 - 51 U/L    Comment: Performed at [facility], [address] [place], [place] [address]  Urinalysis, Complete w Microscopic     Status: Abnormal   Collection Time: 05/16/19  3:51 PM  Result Value Ref Range   Color, Urine STRAW (A) YELLOW   APPearance CLEAR (A) CLEAR   Specific Gravity, Urine 1.010 1.005 - 1.030   pH 7.0 5.0 - 8.0   Glucose, UA NEGATIVE NEGATIVE mg/dL   Hgb urine dipstick NEGATIVE NEGATIVE   Bilirubin Urine NEGATIVE NEGATIVE   Ketones, ur NEGATIVE NEGATIVE mg/dL   Protein, ur NEGATIVE NEGATIVE mg/dL   Nitrite NEGATIVE NEGATIVE   Leukocytes,Ua NEGATIVE NEGATIVE  RBC / HPF 0-5 0 - 5 RBC/hpf   WBC, UA 0-5 0 - 5 WBC/hpf   Bacteria, UA NONE SEEN NONE SEEN   Squamous Epithelial / LPF NONE SEEN 0 - 5    Comment: Performed at [facility], [address] [place], [place] [address]  Ethanol     Status: None   Collection Time: 05/16/19  4:02 PM  Result Value Ref Range   Alcohol, Ethyl (B) <10 <10 mg/dL    Comment: (NOTE) Lowest detectable limit for serum alcohol is 10 mg/dL. For medical purposes only. Performed at [facility], [address] [place], [place] [address]     No current facility-administered medications for this encounter.    Current Outpatient Medications  Medication Sig Dispense Refill  . AMBULATORY NON FORMULARY MEDICATION Morphine Pump Implant    . atorvastatin (LIPITOR) 10 MG tablet Take 1 tablet (10 mg total) by mouth daily. Keep follow up appointment in August 90 tablet 2  . busPIRone (BUSPAR) 10 MG tablet Take 1 tablet (10 mg total) by mouth  daily. (Patient taking differently: Take 10 mg by mouth 2 (two) times daily. )    . docusate sodium (COLACE) 100 MG capsule Take 100 mg by mouth daily as needed.     Marland Kitchen escitalopram (LEXAPRO) 20 MG tablet Take 1 tablet (20 mg total) by mouth daily.    Marland Kitchen lamoTRIgine (LAMICTAL) 200 MG tablet Take 1 tablet (200 mg total) by mouth 2 (two) times daily.    Marland Kitchen levothyroxine (SYNTHROID) 75 MCG tablet TAKE 1 TABLET BY MOUTH  DAILY BEFORE BREAKFAST . 90 tablet 3  . OVER THE COUNTER MEDICATION     . QUEtiapine (SEROQUEL) 100 MG tablet Take 2 tablets (200 mg total) by mouth at bedtime.    . Tapentadol HCl (NUCYNTA) 100 MG TABS Take 100 mg by mouth 3 (three) times daily.     . polyethylene glycol powder (GLYCOLAX/MIRALAX) 17 GM/SCOOP powder Take 17 g by mouth daily as needed for moderate constipation. 255 g 0    Musculoskeletal: Strength & Muscle Tone: within normal limits Gait & Station: normal Patient leans: Backward  Psychiatric Specialty Exam: Physical Exam  Nursing note and vitals reviewed. Constitutional: He is oriented to person, place, and time.  Neck: Normal range of motion. Neck supple.  Cardiovascular: Normal rate.  Respiratory: Effort normal.  Musculoskeletal:        General: Tenderness present.  Neurological: He is alert and oriented to person, place, and time.    Review of Systems  Neurological: Positive for tremors.  Psychiatric/Behavioral: The patient is nervous/anxious and has insomnia.   All other systems reviewed and are negative.   Blood pressure (!) 122/95, pulse 65, temperature 98.5 F (36.9 C), resp. rate 18, height  (1.651 m), weight 77.1 kg, SpO2 100 %.Body mass index is 28.29 kg/m.  General Appearance: Casual  Eye Contact:  Good  Speech:  Clear and Coherent and Pressured  Volume:  Increased  Mood:  Anxious  Affect:  Congruent  Thought Process:  Coherent  Orientation:  Full (Time, Place, and Person)  Thought Content:  Logical  Suicidal Thoughts:  No   Homicidal Thoughts:  No  Memory:  Immediate;   Good Recent;   Good Remote;   Good  Judgement:  Intact  Insight:  Good  Psychomotor Activity:  Normal  Concentration:  Concentration: Fair and Attention Span: Good  Recall:  Good  Fund of Knowledge:  Good  Language:  Good  Akathisia:  Negative  Handed:  Right  AIMS (if indicated):     Assets:  Desire for Improvement Intimacy Physical Health  ADL's:  Intact  Cognition:  WNL  Sleep:   Insomnia     Treatment Plan Summary: Plan Patient does not meet criteria for psychiatric inpatient admission.  Disposition: No evidence of imminent risk to self or others at present.   Patient does not meet criteria for psychiatric inpatient admission. Supportive therapy provided about ongoing stressors.  Caroline Sauger, NP 05/16/2019 11:40 PM

## 2019-05-17 ENCOUNTER — Telehealth: Payer: Self-pay

## 2019-05-17 DIAGNOSIS — F5105 Insomnia due to other mental disorder: Secondary | ICD-10-CM | POA: Diagnosis not present

## 2019-05-17 DIAGNOSIS — F411 Generalized anxiety disorder: Secondary | ICD-10-CM | POA: Diagnosis not present

## 2019-05-17 DIAGNOSIS — F1421 Cocaine dependence, in remission: Secondary | ICD-10-CM | POA: Diagnosis not present

## 2019-05-17 DIAGNOSIS — F3131 Bipolar disorder, current episode depressed, mild: Secondary | ICD-10-CM | POA: Diagnosis not present

## 2019-05-17 NOTE — Telephone Encounter (Signed)
Left message for patient to call back and schedule ER follow up visit.

## 2019-05-17 NOTE — Telephone Encounter (Signed)
Noted thank you

## 2019-05-17 NOTE — Telephone Encounter (Signed)
Pt scheduled for 05/22/19 @ 8:15am

## 2019-05-17 NOTE — Consult Note (Signed)
[facility] Face-to-Face Psychiatry Consult   Reason for Consult: Altered mental status Referring Physician:  Dr. Lenard Lance Patient Identification: Cody [name] MRN:  [id number] Principal Diagnosis: <principal problem not specified> Diagnosis:  Active Problems:   Hypothyroidism   Vitamin D deficiency   HLD (hyperlipidemia)   Anxiety state   CIGARETTE SMOKER   Bipolar affective (HCC)   SYNDROME, CHRONIC PAIN   Shortness of breath   FASTING HYPERGLYCEMIA   ABNORMAL ELECTROCARDIOGRAM   Low serum testosterone level   Advance care planning   LLQ pain   Knee pain   Routine general medical examination at a health care facility   Abnormal stools   Obesity with body mass index greater than 30   Tongue lesion   Vasculitis (HCC)   Total Time spent with patient: 1 hour  Subjective: "I have been experiencing dizziness and becoming confused more often now." Cody [name] is a 48 y.o. male patient presented to [facility] ED via POV from home with wife at patient's side. The patient reported having racing thoughts and dizziness on and off. He voiced these symptoms accompanying anxiety and confusion.  The patient discussed the signs and symptoms have been in existence for a month, and it is baffling to him and his wife not knowing what is going on. The patient was sent to get a scan of his head.  It was discussed with his outpatient psychiatric provider Dr.Kapur. She voiced wanting him to take 300 milligrams of Seroquel tonight to decrease his anxiety and help him rest better. He was agreeable to that one-time medication change for tonight (05/16/2019).  The patient was seen face-to-face by this provider; the chart reviewed and consulted with Dr. Lenard Lance on 05/16/2019 due to the patient's care. It was discussed with the EDP that the patient does not meet the criteria to be admitted to the psychiatric inpatient unit.  On evaluation, the patient is alert and oriented x4, anxious, increased speech (initially) a  couple of minutes into his assessment; he is calm, cooperative and mood-congruent with affect. The patient does not appear to be responding to internal or external stimuli. Neither is the patient presenting with any delusional thinking. The patient admits to experiencing some confusion periodically throughout the day, but he is clear with no signs of any confusion during his assessment.  The patient denies auditory or visual hallucinations. The patient denies suicidal, homicidal, or self-harm ideations. The patient is not presenting with any psychotic or paranoid behaviors. During an encounter with the patient, he was able to answer all questions appropriately. Collateral was obtained by Ms. [name] (wife), who expresses concerns for the patient's sudden change in his mental status over the past month.   The patient wife spoke, being baffled as to what is going on with the patient.  She voiced mania has increased over the past two days but not as bad as she has known him over the years.  She recorded the patient during the run of his confused state.  And he would jump from sentences to sentences without making any connection. Plan: The patient is not a safety risk to self or others and does not require psychiatric inpatient admission for stabilization and treatment. HPI: Per Dr. Linna Darner; Cody [name] is a 48 y.o. male with a past medical history of anxiety, bipolar, chronic back pain, depression, hyperlipidemia, presents to the emergency department for worsening anxiety and ADHD symptoms per patient.  According to the patient over the past several weeks he has noticed  that he has been feeling very anxious, states he is having trouble staying focused and feels like his thoughts are racing at times.  Patient denies any fever cough or shortness of breath.  Denies any nausea vomiting or diarrhea dysuria or hematuria.  Patient states he has been experiencing abdominal pain but this has been going on for years  per patient.  Does not know when his last bowel movement was.  Past Psychiatric History:  Anxiety Bipolar 2 disorder (HCC) Depression PTSD (post traumatic stress disorder) Suicide attempt x1, gesture x1  Risk to Self:  No Risk to Others:  No Prior Inpatient Therapy:  No Prior Outpatient Therapy:  Yes  Past Medical History:  Past Medical History:  Diagnosis Date  . Anxiety   . Bipolar 2 disorder (HCC)   . Chronic back pain   . 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 Degenerative disk disease   . Depression   . Hyperlipidemia   . Hypothyroidism   . PTSD (post-traumatic stress disorder)    suicide attempt X1, gestureX 1    Past Surgical History:  Procedure Laterality Date  . APPENDECTOMY     peritonitis  . INTRATHECAL PUMP IMPLANTATION  2007  . LAMINECTOMY  2004   arterial injury, transfused 7 units pc, implant surgery   . MANDIBLE FRACTURE SURGERY     mugged  . UPPER GASTROINTESTINAL ENDOSCOPY  2006   gastritis   Family History:  Family History  Problem Relation Age of Onset  . Stroke Father 969  . Thyroid disease Father        hypo  . Ulcers Mother   . Depression Mother   . Thyroid disease Mother        hypo  . Lung cancer Maternal Grandmother   . Thyroid disease Maternal Grandmother        hypo  . Heart attack Maternal Grandfather 55  . Esophageal cancer Maternal Grandfather   . Colon cancer Neg Hx   . Diabetes Neg Hx   . Prostate cancer Neg Hx    Family Psychiatric  History: No pertinent family psychiatric history Social History:  Social History   Substance and Sexual Activity  Alcohol Use No     Social History   Substance and Sexual Activity  Drug Use No    Social History   Socioeconomic History  . Marital status: Married    Spouse name: Not on file  . Number of children: 0  . Years of education: Not on file  . Highest education level: Not on file  Occupational History  . Occupation: Disabled   Social Needs  . Financial resource strain: Not on file  . Food insecurity    Worry: Not on file    Inability: Not on file  . Transportation needs    Medical: Not on file    Non-medical: Not on file  Tobacco Use  . Smoking status: Former Smoker    Packs/day: 0.25    Quit date: 08/10/2009    Years since quitting: 9.7  . Smokeless tobacco: Never Used  . Tobacco comment: smoked age intermittently age [age]; up to 2 cigarettes/ day   Substance and Sexual Activity  . Alcohol use: No  . Drug use: No  . Sexual activity: Not on file  Lifestyle  . Physical activity    Days per week: Not on file    Minutes per session: Not on file  .  Stress: Not on file  Relationships  . Social Musician on phone: Not on file    Gets together: Not on file    Attends religious service: Not on file    Active member of club or organization: Not on file    Attends meetings of clubs or organizations: Not on file    Relationship status: Not on file  Other Topics Concern  . Not on file  Social History Narrative   From [place].     [address] Grad   To [place] 2000   Disability from bipolar affective disorder, prev accounting.     Married 1997   No kids   Enjoys walking, travel.     Additional Social History:    Allergies:   Allergies  Allergen Reactions  . Neurontin [Gabapentin]     lethargic  . Vancomycin     Swelling   . Lithium     tremor  . Mirtazapine     REACTION: lethargy ( Remeron)    Labs:  Results for orders placed or performed during the hospital encounter of 05/16/19 (from the past 48 hour(s))  Urine Drug Screen, Qualitative ([facility] only)     Status: Abnormal   Collection Time: 05/16/19  3:34 PM  Result Value Ref Range   Tricyclic, Ur Screen NONE DETECTED NONE DETECTED   Amphetamines, Ur Screen NONE DETECTED NONE DETECTED   MDMA (Ecstasy)Ur Screen NONE DETECTED NONE DETECTED   Cocaine Metabolite,Ur [place] NONE DETECTED NONE DETECTED   Opiate, Ur Screen POSITIVE  (A) NONE DETECTED   Phencyclidine (PCP) Ur S NONE DETECTED NONE DETECTED   Cannabinoid 50 Ng, Ur [place] POSITIVE (A) NONE DETECTED   Barbiturates, Ur Screen NONE DETECTED NONE DETECTED   Benzodiazepine, Ur Scrn NONE DETECTED NONE DETECTED   Methadone Scn, Ur POSITIVE (A) NONE DETECTED    Comment: (NOTE) Tricyclics + metabolites, urine    Cutoff 1000 ng/mL Amphetamines + metabolites, urine  Cutoff 1000 ng/mL MDMA (Ecstasy), urine              Cutoff 500 ng/mL Cocaine Metabolite, urine          Cutoff 300 ng/mL Opiate + metabolites, urine        Cutoff 300 ng/mL Phencyclidine (PCP), urine         Cutoff 25 ng/mL Cannabinoid, urine                 Cutoff 50 ng/mL Barbiturates + metabolites, urine  Cutoff 200 ng/mL Benzodiazepine, urine              Cutoff 200 ng/mL Methadone, urine                   Cutoff 300 ng/mL The urine drug screen provides only a preliminary, unconfirmed analytical test result and should not be used for non-medical purposes. Clinical consideration and professional judgment should be applied to any positive drug screen result due to possible interfering substances. A more specific alternate chemical method must be used in order to obtain a confirmed analytical result. Gas chromatography / mass spectrometry (GC/MS) is the preferred confirmat ory method. Performed at [facility], [address] [place], [place] [address]   CBC     Status: Abnormal   Collection Time: 05/16/19  3:42 PM  Result Value Ref Range   WBC 6.9 4.0 - 10.5 K/uL   RBC 4.67 4.22 - 5.81 MIL/uL   Hemoglobin 14.4 13.0 - 17.0 g/dL  HCT 42.6 39.0 - 52.0 %   MCV 91.2 80.0 - 100.0 fL   MCH 30.8 26.0 - 34.0 pg   MCHC 33.8 30.0 - 36.0 g/dL   RDW 86.5 (L) 78.4 - 69.6 %   Platelets 214 150 - 400 K/uL   nRBC 0.0 0.0 - 0.2 %    Comment: Performed at [facility], [address] [place], [place] [address]  Comprehensive metabolic panel     Status: Abnormal   Collection Time:  05/16/19  3:42 PM  Result Value Ref Range   Sodium 140 135 - 145 mmol/L   Potassium 3.9 3.5 - 5.1 mmol/L   Chloride 100 98 - 111 mmol/L   CO2 28 22 - 32 mmol/L   Glucose, Bld 95 70 - 99 mg/dL   BUN 11 6 - 20 mg/dL   Creatinine, Ser 4.13 0.61 - 1.24 mg/dL   Calcium 9.3 8.9 - 24.4 mg/dL   Total Protein 7.5 6.5 - 8.1 g/dL   Albumin 5.0 3.5 - 5.0 g/dL   AST 34 15 - 41 U/L   ALT 47 (H) 0 - 44 U/L   Alkaline Phosphatase 61 38 - 126 U/L   Total Bilirubin 0.7 0.3 - 1.2 mg/dL   GFR calc non Af Amer >60 >60 mL/min   GFR calc Af Amer >60 >60 mL/min   Anion gap 12 5 - 15    Comment: Performed at [facility], [address] [place], [place] [address]  Lipase, blood     Status: None   Collection Time: 05/16/19  3:42 PM  Result Value Ref Range   Lipase 36 11 - 51 U/L    Comment: Performed at [facility], [address] [place], [place] [address]  Urinalysis, Complete w Microscopic     Status: Abnormal   Collection Time: 05/16/19  3:51 PM  Result Value Ref Range   Color, Urine STRAW (A) YELLOW   APPearance CLEAR (A) CLEAR   Specific Gravity, Urine 1.010 1.005 - 1.030   pH 7.0 5.0 - 8.0   Glucose, UA NEGATIVE NEGATIVE mg/dL   Hgb urine dipstick NEGATIVE NEGATIVE   Bilirubin Urine NEGATIVE NEGATIVE   Ketones, ur NEGATIVE NEGATIVE mg/dL   Protein, ur NEGATIVE NEGATIVE mg/dL   Nitrite NEGATIVE NEGATIVE   Leukocytes,Ua NEGATIVE NEGATIVE   RBC / HPF 0-5 0 - 5 RBC/hpf   WBC, UA 0-5 0 - 5 WBC/hpf   Bacteria, UA NONE SEEN NONE SEEN   Squamous Epithelial / LPF NONE SEEN 0 - 5    Comment: Performed at [facility], [address] [place], [place] [address]  Ethanol     Status: None   Collection Time: 05/16/19  4:02 PM  Result Value Ref Range   Alcohol, Ethyl (B) <10 <10 mg/dL    Comment: (NOTE) Lowest detectable limit for serum alcohol is 10 mg/dL. For medical purposes only. Performed at [facility], [address] [place], [place] [address]      No current facility-administered medications for this encounter.    Current Outpatient Medications  Medication Sig Dispense Refill  . AMBULATORY NON FORMULARY MEDICATION Morphine Pump Implant    . atorvastatin (LIPITOR) 10 MG tablet Take 1 tablet (10 mg total) by mouth daily. Keep follow up appointment in August 90 tablet 2  . busPIRone (BUSPAR) 10 MG tablet Take 1 tablet (10 mg total) by mouth daily. (Patient taking differently: Take 10 mg by mouth 2 (two) times daily. )    .  docusate sodium (COLACE) 100 MG capsule Take 100 mg by mouth daily as needed.     Marland Kitchen escitalopram (LEXAPRO) 20 MG tablet Take 1 tablet (20 mg total) by mouth daily.    Marland Kitchen lamoTRIgine (LAMICTAL) 200 MG tablet Take 1 tablet (200 mg total) by mouth 2 (two) times daily.    Marland Kitchen levothyroxine (SYNTHROID) 75 MCG tablet TAKE 1 TABLET BY MOUTH  DAILY BEFORE BREAKFAST . 90 tablet 3  . OVER THE COUNTER MEDICATION     . QUEtiapine (SEROQUEL) 100 MG tablet Take 2 tablets (200 mg total) by mouth at bedtime.    . Tapentadol HCl (NUCYNTA) 100 MG TABS Take 100 mg by mouth 3 (three) times daily.     . polyethylene glycol powder (GLYCOLAX/MIRALAX) 17 GM/SCOOP powder Take 17 g by mouth daily as needed for moderate constipation. 255 g 0    Musculoskeletal: Strength & Muscle Tone: within normal limits Gait & Station: normal Patient leans: Backward  Psychiatric Specialty Exam: Physical Exam  Nursing note and vitals reviewed. Constitutional: He is oriented to person, place, and time.  Neck: Normal range of motion. Neck supple.  Cardiovascular: Normal rate.  Respiratory: Effort normal.  Musculoskeletal:        General: Tenderness present.  Neurological: He is alert and oriented to person, place, and time.    Review of Systems  Neurological: Positive for tremors.  Psychiatric/Behavioral: The patient is nervous/anxious and has insomnia.   All other systems reviewed and are negative.   Blood pressure (!) 122/95, pulse 65,  temperature 98.5 F (36.9 C), resp. rate 18, height 5\' 5"  (1.651 m), weight 77.1 kg, SpO2 100 %.Body mass index is 28.29 kg/m.  General Appearance: Casual  Eye Contact:  Good  Speech:  Clear and Coherent and Pressured  Volume:  Increased  Mood:  Anxious  Affect:  Congruent  Thought Process:  Coherent  Orientation:  Full (Time, Place, and Person)  Thought Content:  Logical  Suicidal Thoughts:  No  Homicidal Thoughts:  No  Memory:  Immediate;   Good Recent;   Good Remote;   Good  Judgement:  Intact  Insight:  Good  Psychomotor Activity:  Normal  Concentration:  Concentration: Fair and Attention Span: Good  Recall:  Good  Fund of Knowledge:  Good  Language:  Good  Akathisia:  Negative  Handed:  Right  AIMS (if indicated):     Assets:  Desire for Improvement Intimacy Physical Health  ADL's:  Intact  Cognition:  WNL  Sleep:   Insomnia     Treatment Plan Summary: Plan Patient does not meet criteria for psychiatric inpatient admission.  Disposition: No evidence of imminent risk to self or others at present.   Patient does not meet criteria for psychiatric inpatient admission. Supportive therapy provided about ongoing stressors.  Caroline Sauger, NP 05/17/2019 12:28 AM

## 2019-05-22 ENCOUNTER — Ambulatory Visit (INDEPENDENT_AMBULATORY_CARE_PROVIDER_SITE_OTHER): Payer: BC Managed Care – PPO | Admitting: Family Medicine

## 2019-05-22 ENCOUNTER — Other Ambulatory Visit: Payer: Self-pay

## 2019-05-22 ENCOUNTER — Encounter: Payer: Self-pay | Admitting: Family Medicine

## 2019-05-22 VITALS — BP 102/70 | HR 96 | Temp 97.3°F | Wt 176.8 lb

## 2019-05-22 DIAGNOSIS — F3170 Bipolar disorder, currently in remission, most recent episode unspecified: Secondary | ICD-10-CM

## 2019-05-22 DIAGNOSIS — R7989 Other specified abnormal findings of blood chemistry: Secondary | ICD-10-CM | POA: Diagnosis not present

## 2019-05-22 MED ORDER — QUETIAPINE FUMARATE 100 MG PO TABS: 300.0000 mg | ORAL_TABLET | Freq: Every day | ORAL | Status: DC

## 2019-05-22 MED ORDER — ESCITALOPRAM OXALATE 20 MG PO TABS: 10.0000 mg | ORAL_TABLET | Freq: Every day | ORAL | Status: DC

## 2019-05-22 NOTE — Progress Notes (Signed)
ER f/u.    Presented to ER with anxiety and thought changes.   "I could have had 3 different conversations at the same time and not known it."  Med list adjusted per psych recs.  He clearly feels better in the meantime.    CT neg.  abd films with large amount of stool throughout the colon and down into the rectosigmoid area consistent with significant constipation.  D/w pt.  Labs BM was 2 days ago.  No blood in stool.  He is taking miralax prn, d/w pt.  He uses dulcolax as a back up as needed.    Minimal ALT elevation, d/w pt.  Similar to prev.    He has been using CBD oil at baseline, so this isn't a new issue that was likely to cause his recent sx.  He isn't smoking marijuana.    No racing thoughts.  No SI/HI.  He had psych f/u in the meantime.    Meds, vitals, and allergies reviewed.   ROS: Per HPI unless specifically indicated in ROS section   GEN: nad, alert and oriented HEENT: ncat NECK: supple w/o LA CV: rrr. PULM: ctab, no inc wob ABD: soft, +bs EXT: no edema SKIN: no acute rash

## 2019-05-22 NOTE — Patient Instructions (Signed)
Don't change your meds for now.  I'll update Dr. Nicolasa Ducking.  Reasonable to recheck your liver tests in 1 month.  Please schedule a lab visit on the way out.  Take care.  Glad to see you.

## 2019-05-25 NOTE — Assessment & Plan Note (Signed)
ER course discussed with patient.  Reasonable to check liver tests in 1 month.  See orders.  See after visit summary.  We will update psychiatry in the meantime.  See above.  His mood is improved.  No suicidal or homicidal intent.  No racing thoughts.  Okay for outpatient follow-up.  I did not change his psychotropic medications at this point, I will defer to psychiatry (with appreciation of psychiatric clinic input).

## 2019-05-26 DIAGNOSIS — F1421 Cocaine dependence, in remission: Secondary | ICD-10-CM | POA: Diagnosis not present

## 2019-05-26 DIAGNOSIS — F5105 Insomnia due to other mental disorder: Secondary | ICD-10-CM | POA: Diagnosis not present

## 2019-05-26 DIAGNOSIS — F3131 Bipolar disorder, current episode depressed, mild: Secondary | ICD-10-CM | POA: Diagnosis not present

## 2019-05-26 DIAGNOSIS — F411 Generalized anxiety disorder: Secondary | ICD-10-CM | POA: Diagnosis not present

## 2019-06-09 DIAGNOSIS — F3131 Bipolar disorder, current episode depressed, mild: Secondary | ICD-10-CM | POA: Diagnosis not present

## 2019-06-09 DIAGNOSIS — F5105 Insomnia due to other mental disorder: Secondary | ICD-10-CM | POA: Diagnosis not present

## 2019-06-09 DIAGNOSIS — F411 Generalized anxiety disorder: Secondary | ICD-10-CM | POA: Diagnosis not present

## 2019-06-09 DIAGNOSIS — F1421 Cocaine dependence, in remission: Secondary | ICD-10-CM | POA: Diagnosis not present

## 2019-06-16 ENCOUNTER — Other Ambulatory Visit: Payer: Self-pay | Admitting: Family Medicine

## 2019-06-16 DIAGNOSIS — M545 Low back pain: Secondary | ICD-10-CM | POA: Diagnosis not present

## 2019-06-16 DIAGNOSIS — Z978 Presence of other specified devices: Secondary | ICD-10-CM | POA: Diagnosis not present

## 2019-06-16 DIAGNOSIS — M5417 Radiculopathy, lumbosacral region: Secondary | ICD-10-CM | POA: Diagnosis not present

## 2019-06-16 DIAGNOSIS — G894 Chronic pain syndrome: Secondary | ICD-10-CM | POA: Diagnosis not present

## 2019-06-22 ENCOUNTER — Other Ambulatory Visit: Payer: BC Managed Care – PPO

## 2019-06-22 ENCOUNTER — Other Ambulatory Visit: Payer: Medicare Other

## 2019-06-22 ENCOUNTER — Ambulatory Visit: Payer: Medicare Other

## 2019-06-22 DIAGNOSIS — F1421 Cocaine dependence, in remission: Secondary | ICD-10-CM | POA: Diagnosis not present

## 2019-06-22 DIAGNOSIS — F3131 Bipolar disorder, current episode depressed, mild: Secondary | ICD-10-CM | POA: Diagnosis not present

## 2019-06-22 DIAGNOSIS — F411 Generalized anxiety disorder: Secondary | ICD-10-CM | POA: Diagnosis not present

## 2019-06-22 DIAGNOSIS — F5105 Insomnia due to other mental disorder: Secondary | ICD-10-CM | POA: Diagnosis not present

## 2019-06-23 ENCOUNTER — Other Ambulatory Visit: Payer: Self-pay

## 2019-06-23 ENCOUNTER — Other Ambulatory Visit (INDEPENDENT_AMBULATORY_CARE_PROVIDER_SITE_OTHER): Payer: BC Managed Care – PPO

## 2019-06-23 ENCOUNTER — Other Ambulatory Visit: Payer: BC Managed Care – PPO

## 2019-06-23 DIAGNOSIS — R7989 Other specified abnormal findings of blood chemistry: Secondary | ICD-10-CM | POA: Diagnosis not present

## 2019-06-24 LAB — HEPATIC FUNCTION PANEL
ALT: 55 IU/L — ABNORMAL HIGH (ref 0–44)
AST: 31 IU/L (ref 0–40)
Albumin: 4.7 g/dL (ref 4.0–5.0)
Alkaline Phosphatase: 79 IU/L (ref 39–117)
Bilirubin Total: 0.2 mg/dL (ref 0.0–1.2)
Bilirubin, Direct: 0.08 mg/dL (ref 0.00–0.40)
Total Protein: 7.2 g/dL (ref 6.0–8.5)

## 2019-06-25 ENCOUNTER — Other Ambulatory Visit: Payer: Self-pay | Admitting: Family Medicine

## 2019-06-25 DIAGNOSIS — R7989 Other specified abnormal findings of blood chemistry: Secondary | ICD-10-CM

## 2019-06-26 DIAGNOSIS — F411 Generalized anxiety disorder: Secondary | ICD-10-CM | POA: Diagnosis not present

## 2019-06-26 DIAGNOSIS — F3131 Bipolar disorder, current episode depressed, mild: Secondary | ICD-10-CM | POA: Diagnosis not present

## 2019-06-28 ENCOUNTER — Telehealth: Payer: Self-pay

## 2019-06-28 NOTE — Telephone Encounter (Signed)
I am glad he is cutting back on vaping but I do not suspect that is the issue and it still makes sense to get the follow-up liver tests/ultrasound done.  Thanks.

## 2019-06-28 NOTE — Telephone Encounter (Signed)
Pt said he has a liver US scheduled; pt wanted to ask about vaping. Pt has vaped for years. Pt has been in process of weekly down sizing the vaping and pt wants to know because of the high dose of nicotine involved with vaping if that could cause pts liver enzymes to be elevated. Pt has 0 nicotine now and is wiening off of vaping. Pt request cb for answer to question prior to having liver US.Please advise.

## 2019-06-29 ENCOUNTER — Other Ambulatory Visit: Payer: BC Managed Care – PPO

## 2019-06-29 ENCOUNTER — Other Ambulatory Visit (INDEPENDENT_AMBULATORY_CARE_PROVIDER_SITE_OTHER): Payer: BC Managed Care – PPO

## 2019-06-29 DIAGNOSIS — R7989 Other specified abnormal findings of blood chemistry: Secondary | ICD-10-CM

## 2019-06-29 NOTE — Telephone Encounter (Signed)
Patient advised. Patient asked if I could alert the lab that his labs from this morning needs to go to [place] notified.

## 2019-06-29 NOTE — Addendum Note (Signed)
Addended by: Ellamae Sia on: 06/29/2019 10:02 AM   Modules accepted: Orders

## 2019-07-01 LAB — HEPATITIS PANEL, ACUTE
Hep A IgM: NEGATIVE
Hep B C IgM: NEGATIVE
Hep C Virus Ab: <0.1 s/co ratio (ref 0.0–0.9)
Hepatitis B Surface Ag: NEGATIVE

## 2019-07-02 DIAGNOSIS — F3131 Bipolar disorder, current episode depressed, mild: Secondary | ICD-10-CM | POA: Diagnosis not present

## 2019-07-02 DIAGNOSIS — F5105 Insomnia due to other mental disorder: Secondary | ICD-10-CM | POA: Diagnosis not present

## 2019-07-02 DIAGNOSIS — F1421 Cocaine dependence, in remission: Secondary | ICD-10-CM | POA: Diagnosis not present

## 2019-07-02 DIAGNOSIS — F411 Generalized anxiety disorder: Secondary | ICD-10-CM | POA: Diagnosis not present

## 2019-07-04 ENCOUNTER — Other Ambulatory Visit: Payer: Self-pay

## 2019-07-04 ENCOUNTER — Ambulatory Visit
Admission: RE | Admit: 2019-07-04 | Discharge: 2019-07-04 | Disposition: A | Discharge status: Home / Self Care | Payer: BC Managed Care – PPO | Source: Ambulatory Visit | Attending: Family Medicine | Admitting: Family Medicine

## 2019-07-04 ENCOUNTER — Other Ambulatory Visit: Payer: BC Managed Care – PPO

## 2019-07-04 DIAGNOSIS — R7989 Other specified abnormal findings of blood chemistry: Secondary | ICD-10-CM | POA: Diagnosis not present

## 2019-07-04 DIAGNOSIS — K7689 Other specified diseases of liver: Secondary | ICD-10-CM | POA: Diagnosis not present

## 2019-07-05 ENCOUNTER — Other Ambulatory Visit: Payer: Self-pay | Admitting: Family Medicine

## 2019-07-05 DIAGNOSIS — R7989 Other specified abnormal findings of blood chemistry: Secondary | ICD-10-CM

## 2019-07-05 DIAGNOSIS — K760 Fatty (change of) liver, not elsewhere classified: Secondary | ICD-10-CM

## 2019-07-20 DIAGNOSIS — F609 Personality disorder, unspecified: Secondary | ICD-10-CM | POA: Diagnosis not present

## 2019-07-20 DIAGNOSIS — F3132 Bipolar disorder, current episode depressed, moderate: Secondary | ICD-10-CM | POA: Diagnosis not present

## 2019-07-20 DIAGNOSIS — F1421 Cocaine dependence, in remission: Secondary | ICD-10-CM | POA: Diagnosis not present

## 2019-07-20 DIAGNOSIS — F410 Panic disorder [episodic paroxysmal anxiety] without agoraphobia: Secondary | ICD-10-CM | POA: Diagnosis not present

## 2019-07-31 DIAGNOSIS — G894 Chronic pain syndrome: Secondary | ICD-10-CM | POA: Diagnosis not present

## 2019-07-31 DIAGNOSIS — Z5181 Encounter for therapeutic drug level monitoring: Secondary | ICD-10-CM | POA: Diagnosis not present

## 2019-07-31 DIAGNOSIS — M5417 Radiculopathy, lumbosacral region: Secondary | ICD-10-CM | POA: Diagnosis not present

## 2019-07-31 DIAGNOSIS — M546 Pain in thoracic spine: Secondary | ICD-10-CM | POA: Diagnosis not present

## 2019-07-31 DIAGNOSIS — Z978 Presence of other specified devices: Secondary | ICD-10-CM | POA: Diagnosis not present

## 2019-07-31 DIAGNOSIS — Z79899 Other long term (current) drug therapy: Secondary | ICD-10-CM | POA: Diagnosis not present

## 2019-08-16 DIAGNOSIS — R410 Disorientation, unspecified: Secondary | ICD-10-CM | POA: Diagnosis not present

## 2019-08-17 DIAGNOSIS — R410 Disorientation, unspecified: Secondary | ICD-10-CM | POA: Diagnosis not present

## 2019-08-17 DIAGNOSIS — E519 Thiamine deficiency, unspecified: Secondary | ICD-10-CM | POA: Diagnosis not present

## 2019-08-17 DIAGNOSIS — F5105 Insomnia due to other mental disorder: Secondary | ICD-10-CM | POA: Diagnosis not present

## 2019-08-17 DIAGNOSIS — E538 Deficiency of other specified B group vitamins: Secondary | ICD-10-CM | POA: Diagnosis not present

## 2019-08-17 DIAGNOSIS — F411 Generalized anxiety disorder: Secondary | ICD-10-CM | POA: Diagnosis not present

## 2019-08-17 DIAGNOSIS — F3131 Bipolar disorder, current episode depressed, mild: Secondary | ICD-10-CM | POA: Diagnosis not present

## 2019-09-11 DIAGNOSIS — G894 Chronic pain syndrome: Secondary | ICD-10-CM | POA: Diagnosis not present

## 2019-09-11 DIAGNOSIS — M5440 Lumbago with sciatica, unspecified side: Secondary | ICD-10-CM | POA: Diagnosis not present

## 2019-09-11 DIAGNOSIS — Z79899 Other long term (current) drug therapy: Secondary | ICD-10-CM | POA: Diagnosis not present

## 2019-09-11 DIAGNOSIS — Z5181 Encounter for therapeutic drug level monitoring: Secondary | ICD-10-CM | POA: Diagnosis not present

## 2019-09-11 DIAGNOSIS — M5417 Radiculopathy, lumbosacral region: Secondary | ICD-10-CM | POA: Diagnosis not present

## 2019-09-11 DIAGNOSIS — Z978 Presence of other specified devices: Secondary | ICD-10-CM | POA: Diagnosis not present

## 2019-09-12 DIAGNOSIS — F1421 Cocaine dependence, in remission: Secondary | ICD-10-CM | POA: Diagnosis not present

## 2019-09-12 DIAGNOSIS — F609 Personality disorder, unspecified: Secondary | ICD-10-CM | POA: Diagnosis not present

## 2019-09-12 DIAGNOSIS — F410 Panic disorder [episodic paroxysmal anxiety] without agoraphobia: Secondary | ICD-10-CM | POA: Diagnosis not present

## 2019-09-12 DIAGNOSIS — F3132 Bipolar disorder, current episode depressed, moderate: Secondary | ICD-10-CM | POA: Diagnosis not present

## 2019-09-28 DIAGNOSIS — R413 Other amnesia: Secondary | ICD-10-CM | POA: Diagnosis not present

## 2019-09-28 DIAGNOSIS — F314 Bipolar disorder, current episode depressed, severe, without psychotic features: Secondary | ICD-10-CM | POA: Diagnosis not present

## 2019-10-02 ENCOUNTER — Telehealth: Payer: Self-pay

## 2019-10-02 ENCOUNTER — Ambulatory Visit: Payer: BC Managed Care – PPO | Admitting: Family Medicine

## 2019-10-02 NOTE — Telephone Encounter (Signed)
[facility] Night - Client Nonclinical Telephone Record AccessNurse Client [facility] Night - Client Client Site [facility] - Night Physician Raechel Ache - MD Contact Type Call Who Is Calling Patient / Member / Family / Caregiver Caller Name [facility] [place] Caller Phone Number [phone] Patient Name Cody [name] Patient DOB 07/27/1971 Call Type Message Only Information Provided Reason for Call Request to [facility] Appointment Initial Comment Caller states needs to cancel appt. for today. Additional Comment Declines triage. Disp. Time Disposition Final User 10/02/2019 8:04:04 AM General Information Provided Yes Osborne Casco Call Closed By: Osborne Casco Transaction Date/Time: 10/02/2019 8:02:33 AM (ET)

## 2019-10-02 NOTE — Telephone Encounter (Signed)
appt has already been cancelled per [facility]. I spoke with pt and the muscle pain in rt chest area;pt said doing much better and pt not having any pain now and pt will cb if needed. FYI to Dr Para March.

## 2019-10-02 NOTE — Telephone Encounter (Signed)
Noted. Thanks.

## 2019-10-03 DIAGNOSIS — F1421 Cocaine dependence, in remission: Secondary | ICD-10-CM | POA: Diagnosis not present

## 2019-10-03 DIAGNOSIS — F609 Personality disorder, unspecified: Secondary | ICD-10-CM | POA: Diagnosis not present

## 2019-10-03 DIAGNOSIS — F410 Panic disorder [episodic paroxysmal anxiety] without agoraphobia: Secondary | ICD-10-CM | POA: Diagnosis not present

## 2019-10-03 DIAGNOSIS — F3132 Bipolar disorder, current episode depressed, moderate: Secondary | ICD-10-CM | POA: Diagnosis not present

## 2019-10-10 DIAGNOSIS — R413 Other amnesia: Secondary | ICD-10-CM | POA: Diagnosis not present

## 2019-10-10 DIAGNOSIS — F314 Bipolar disorder, current episode depressed, severe, without psychotic features: Secondary | ICD-10-CM | POA: Diagnosis not present

## 2019-10-14 ENCOUNTER — Other Ambulatory Visit: Payer: Self-pay | Admitting: Family Medicine

## 2019-10-14 DIAGNOSIS — E785 Hyperlipidemia, unspecified: Secondary | ICD-10-CM

## 2019-10-19 DIAGNOSIS — G4733 Obstructive sleep apnea (adult) (pediatric): Secondary | ICD-10-CM | POA: Diagnosis not present

## 2019-10-23 DIAGNOSIS — M5417 Radiculopathy, lumbosacral region: Secondary | ICD-10-CM | POA: Diagnosis not present

## 2019-10-23 DIAGNOSIS — M5440 Lumbago with sciatica, unspecified side: Secondary | ICD-10-CM | POA: Diagnosis not present

## 2019-10-23 DIAGNOSIS — G894 Chronic pain syndrome: Secondary | ICD-10-CM | POA: Diagnosis not present

## 2019-10-23 DIAGNOSIS — Z978 Presence of other specified devices: Secondary | ICD-10-CM | POA: Diagnosis not present

## 2019-10-24 DIAGNOSIS — G3184 Mild cognitive impairment, so stated: Secondary | ICD-10-CM | POA: Diagnosis not present

## 2019-10-24 DIAGNOSIS — F314 Bipolar disorder, current episode depressed, severe, without psychotic features: Secondary | ICD-10-CM | POA: Diagnosis not present

## 2019-10-24 DIAGNOSIS — R413 Other amnesia: Secondary | ICD-10-CM | POA: Diagnosis not present

## 2019-10-26 DIAGNOSIS — F3132 Bipolar disorder, current episode depressed, moderate: Secondary | ICD-10-CM | POA: Diagnosis not present

## 2019-10-26 DIAGNOSIS — F609 Personality disorder, unspecified: Secondary | ICD-10-CM | POA: Diagnosis not present

## 2019-10-26 DIAGNOSIS — F410 Panic disorder [episodic paroxysmal anxiety] without agoraphobia: Secondary | ICD-10-CM | POA: Diagnosis not present

## 2019-10-26 DIAGNOSIS — F1421 Cocaine dependence, in remission: Secondary | ICD-10-CM | POA: Diagnosis not present

## 2019-10-30 DIAGNOSIS — F5105 Insomnia due to other mental disorder: Secondary | ICD-10-CM | POA: Diagnosis not present

## 2019-10-30 DIAGNOSIS — F3131 Bipolar disorder, current episode depressed, mild: Secondary | ICD-10-CM | POA: Diagnosis not present

## 2019-10-30 DIAGNOSIS — F411 Generalized anxiety disorder: Secondary | ICD-10-CM | POA: Diagnosis not present

## 2019-11-03 ENCOUNTER — Ambulatory Visit: Payer: Medicare Other

## 2019-11-08 ENCOUNTER — Other Ambulatory Visit: Payer: Self-pay | Admitting: Neurology

## 2019-11-08 ENCOUNTER — Other Ambulatory Visit (HOSPITAL_COMMUNITY): Payer: Self-pay | Admitting: Neurology

## 2019-11-08 DIAGNOSIS — R410 Disorientation, unspecified: Secondary | ICD-10-CM

## 2019-11-23 ENCOUNTER — Other Ambulatory Visit: Payer: Self-pay

## 2019-11-23 ENCOUNTER — Ambulatory Visit
Admission: RE | Admit: 2019-11-23 | Discharge: 2019-11-23 | Disposition: A | Discharge status: Home / Self Care | Payer: BC Managed Care – PPO | Source: Ambulatory Visit | Attending: Neurology | Admitting: Neurology

## 2019-11-23 DIAGNOSIS — R410 Disorientation, unspecified: Secondary | ICD-10-CM | POA: Insufficient documentation

## 2019-11-23 MED ORDER — GADOBUTROL 1 MMOL/ML IV SOLN
8.0000 mL | Freq: Once | INTRAVENOUS | Status: AC | PRN
  Administered 2019-11-23: 12:00:00 8 mL via INTRAVENOUS

## 2019-11-30 DIAGNOSIS — Z23 Encounter for immunization: Secondary | ICD-10-CM | POA: Diagnosis not present

## 2019-12-05 DIAGNOSIS — M5441 Lumbago with sciatica, right side: Secondary | ICD-10-CM | POA: Diagnosis not present

## 2019-12-05 DIAGNOSIS — M5440 Lumbago with sciatica, unspecified side: Secondary | ICD-10-CM | POA: Diagnosis not present

## 2019-12-05 DIAGNOSIS — M546 Pain in thoracic spine: Secondary | ICD-10-CM | POA: Diagnosis not present

## 2019-12-05 DIAGNOSIS — Z978 Presence of other specified devices: Secondary | ICD-10-CM | POA: Diagnosis not present

## 2019-12-05 DIAGNOSIS — G894 Chronic pain syndrome: Secondary | ICD-10-CM | POA: Diagnosis not present

## 2019-12-14 DIAGNOSIS — R410 Disorientation, unspecified: Secondary | ICD-10-CM | POA: Diagnosis not present

## 2020-01-01 DIAGNOSIS — F5105 Insomnia due to other mental disorder: Secondary | ICD-10-CM | POA: Diagnosis not present

## 2020-01-01 DIAGNOSIS — F3131 Bipolar disorder, current episode depressed, mild: Secondary | ICD-10-CM | POA: Diagnosis not present

## 2020-01-01 DIAGNOSIS — F1421 Cocaine dependence, in remission: Secondary | ICD-10-CM | POA: Diagnosis not present

## 2020-01-01 DIAGNOSIS — F411 Generalized anxiety disorder: Secondary | ICD-10-CM | POA: Diagnosis not present

## 2020-01-16 DIAGNOSIS — M5441 Lumbago with sciatica, right side: Secondary | ICD-10-CM | POA: Diagnosis not present

## 2020-01-16 DIAGNOSIS — Z978 Presence of other specified devices: Secondary | ICD-10-CM | POA: Diagnosis not present

## 2020-01-16 DIAGNOSIS — G894 Chronic pain syndrome: Secondary | ICD-10-CM | POA: Diagnosis not present

## 2020-01-16 DIAGNOSIS — K5903 Drug induced constipation: Secondary | ICD-10-CM | POA: Diagnosis not present

## 2020-01-16 DIAGNOSIS — M545 Low back pain: Secondary | ICD-10-CM | POA: Diagnosis not present

## 2020-01-17 ENCOUNTER — Other Ambulatory Visit: Payer: Self-pay | Admitting: Family Medicine

## 2020-01-19 DIAGNOSIS — F411 Generalized anxiety disorder: Secondary | ICD-10-CM | POA: Diagnosis not present

## 2020-01-19 DIAGNOSIS — F5105 Insomnia due to other mental disorder: Secondary | ICD-10-CM | POA: Diagnosis not present

## 2020-01-19 DIAGNOSIS — F3131 Bipolar disorder, current episode depressed, mild: Secondary | ICD-10-CM | POA: Diagnosis not present

## 2020-01-19 DIAGNOSIS — F1421 Cocaine dependence, in remission: Secondary | ICD-10-CM | POA: Diagnosis not present

## 2020-02-06 ENCOUNTER — Other Ambulatory Visit: Payer: Medicare Other

## 2020-02-06 ENCOUNTER — Other Ambulatory Visit: Payer: Self-pay | Admitting: Family Medicine

## 2020-02-06 DIAGNOSIS — E559 Vitamin D deficiency, unspecified: Secondary | ICD-10-CM

## 2020-02-06 DIAGNOSIS — E785 Hyperlipidemia, unspecified: Secondary | ICD-10-CM

## 2020-02-06 DIAGNOSIS — E039 Hypothyroidism, unspecified: Secondary | ICD-10-CM

## 2020-02-08 ENCOUNTER — Telehealth: Payer: Self-pay | Admitting: Family Medicine

## 2020-02-08 NOTE — Telephone Encounter (Signed)
Patient sent message via mychart requesting covid antibody test be done at time of labs on 7/13. Please advise.

## 2020-02-09 NOTE — Telephone Encounter (Signed)
Please update patient.  I have had a lot of patients ask about this.  We do not usually check for Covid antibodies.  If the patient had previous Covid infection, regardless of antibody status, we would still recommend him to get vaccinated.  If he had not had Covid, we would still recommend him to get vaccinated.  Since it does not change the plan, we do not usually order the lab.

## 2020-02-09 NOTE — Telephone Encounter (Signed)
Patient did not even remember placing the MyChart message but advised of the answer regardless.

## 2020-02-13 ENCOUNTER — Encounter: Payer: Medicare Other | Admitting: Family Medicine

## 2020-02-19 ENCOUNTER — Other Ambulatory Visit: Payer: Self-pay

## 2020-02-19 ENCOUNTER — Other Ambulatory Visit (INDEPENDENT_AMBULATORY_CARE_PROVIDER_SITE_OTHER): Payer: BC Managed Care – PPO

## 2020-02-19 DIAGNOSIS — E559 Vitamin D deficiency, unspecified: Secondary | ICD-10-CM

## 2020-02-19 DIAGNOSIS — E785 Hyperlipidemia, unspecified: Secondary | ICD-10-CM | POA: Diagnosis not present

## 2020-02-19 DIAGNOSIS — Z131 Encounter for screening for diabetes mellitus: Secondary | ICD-10-CM

## 2020-02-19 DIAGNOSIS — E039 Hypothyroidism, unspecified: Secondary | ICD-10-CM

## 2020-02-20 ENCOUNTER — Other Ambulatory Visit: Payer: Medicare Other

## 2020-02-20 LAB — TSH: TSH: 1.53 u[IU]/mL (ref 0.450–4.500)

## 2020-02-20 LAB — COMPREHENSIVE METABOLIC PANEL
ALT: 50 IU/L — ABNORMAL HIGH (ref 0–44)
AST: 35 IU/L (ref 0–40)
Albumin/Globulin Ratio: 1.9 (ref 1.2–2.2)
Albumin: 5 g/dL (ref 4.0–5.0)
Alkaline Phosphatase: 117 IU/L (ref 48–121)
BUN/Creatinine Ratio: 11 (ref 9–20)
BUN: 14 mg/dL (ref 6–24)
Bilirubin Total: 0.3 mg/dL (ref 0.0–1.2)
CO2: 28 mmol/L (ref 20–29)
Calcium: 9.7 mg/dL (ref 8.7–10.2)
Chloride: 100 mmol/L (ref 96–106)
Creatinine, Ser: 1.26 mg/dL (ref 0.76–1.27)
GFR calc Af Amer: 77 mL/min/{1.73_m2} (ref >59)
GFR calc non Af Amer: 67 mL/min/{1.73_m2} (ref >59)
Globulin, Total: 2.7 g/dL (ref 1.5–4.5)
Glucose: 130 mg/dL — ABNORMAL HIGH (ref 65–99)
Potassium: 4.5 mmol/L (ref 3.5–5.2)
Sodium: 143 mmol/L (ref 134–144)
Total Protein: 7.7 g/dL (ref 6.0–8.5)

## 2020-02-20 LAB — CBC WITH DIFFERENTIAL/PLATELET
Basophils Absolute: 0.1 10*3/uL (ref 0.0–0.2)
Basos: 1 %
EOS (ABSOLUTE): 0.2 10*3/uL (ref 0.0–0.4)
Eos: 2 %
Hematocrit: 46.9 % (ref 37.5–51.0)
Hemoglobin: 15.6 g/dL (ref 13.0–17.7)
Immature Grans (Abs): 0 10*3/uL (ref 0.0–0.1)
Immature Granulocytes: 0 %
Lymphocytes Absolute: 3.2 10*3/uL — ABNORMAL HIGH (ref 0.7–3.1)
Lymphs: 34 %
MCH: 31.2 pg (ref 26.6–33.0)
MCHC: 33.3 g/dL (ref 31.5–35.7)
MCV: 94 fL (ref 79–97)
Monocytes Absolute: 0.4 10*3/uL (ref 0.1–0.9)
Monocytes: 5 %
Neutrophils Absolute: 5.6 10*3/uL (ref 1.4–7.0)
Neutrophils: 58 %
Platelets: 329 10*3/uL (ref 150–450)
RBC: 5 x10E6/uL (ref 4.14–5.80)
RDW: 11.6 % (ref 11.6–15.4)
WBC: 9.5 10*3/uL (ref 3.4–10.8)

## 2020-02-20 LAB — LIPID PANEL
Chol/HDL Ratio: 5.7 ratio — ABNORMAL HIGH (ref 0.0–5.0)
Cholesterol, Total: 166 mg/dL (ref 100–199)
HDL: 29 mg/dL — ABNORMAL LOW (ref >39)
LDL Chol Calc (NIH): 88 mg/dL (ref 0–99)
Triglycerides: 291 mg/dL — ABNORMAL HIGH (ref 0–149)
VLDL Cholesterol Cal: 49 mg/dL — ABNORMAL HIGH (ref 5–40)

## 2020-02-20 LAB — SPECIMEN STATUS REPORT

## 2020-02-20 LAB — HEMOGLOBIN A1C
Est. average glucose Bld gHb Est-mCnc: 114 mg/dL
Hgb A1c MFr Bld: 5.6 % (ref 4.8–5.6)

## 2020-02-20 LAB — VITAMIN D 25 HYDROXY (VIT D DEFICIENCY, FRACTURES): Vit D, 25-Hydroxy: 28 ng/mL — ABNORMAL LOW (ref 30.0–100.0)

## 2020-02-21 DIAGNOSIS — M533 Sacrococcygeal disorders, not elsewhere classified: Secondary | ICD-10-CM | POA: Diagnosis not present

## 2020-02-21 DIAGNOSIS — F411 Generalized anxiety disorder: Secondary | ICD-10-CM | POA: Diagnosis not present

## 2020-02-26 DIAGNOSIS — M5441 Lumbago with sciatica, right side: Secondary | ICD-10-CM | POA: Diagnosis not present

## 2020-02-26 DIAGNOSIS — K5903 Drug induced constipation: Secondary | ICD-10-CM | POA: Diagnosis not present

## 2020-02-26 DIAGNOSIS — Z978 Presence of other specified devices: Secondary | ICD-10-CM | POA: Diagnosis not present

## 2020-02-26 DIAGNOSIS — G894 Chronic pain syndrome: Secondary | ICD-10-CM | POA: Diagnosis not present

## 2020-02-26 DIAGNOSIS — Z79899 Other long term (current) drug therapy: Secondary | ICD-10-CM | POA: Diagnosis not present

## 2020-02-27 ENCOUNTER — Other Ambulatory Visit: Payer: Self-pay

## 2020-02-27 ENCOUNTER — Encounter: Payer: Self-pay | Admitting: Family Medicine

## 2020-02-27 ENCOUNTER — Ambulatory Visit (INDEPENDENT_AMBULATORY_CARE_PROVIDER_SITE_OTHER): Payer: BC Managed Care – PPO | Admitting: Family Medicine

## 2020-02-27 VITALS — BP 142/90 | HR 92 | Temp 96.8°F | Ht 64.25 in | Wt 189.4 lb

## 2020-02-27 DIAGNOSIS — N529 Male erectile dysfunction, unspecified: Secondary | ICD-10-CM

## 2020-02-27 DIAGNOSIS — R7989 Other specified abnormal findings of blood chemistry: Secondary | ICD-10-CM

## 2020-02-27 DIAGNOSIS — Z0000 Encounter for general adult medical examination without abnormal findings: Secondary | ICD-10-CM

## 2020-02-27 DIAGNOSIS — G894 Chronic pain syndrome: Secondary | ICD-10-CM

## 2020-02-27 DIAGNOSIS — F3170 Bipolar disorder, currently in remission, most recent episode unspecified: Secondary | ICD-10-CM

## 2020-02-27 DIAGNOSIS — Z7189 Other specified counseling: Secondary | ICD-10-CM

## 2020-02-27 DIAGNOSIS — E039 Hypothyroidism, unspecified: Secondary | ICD-10-CM

## 2020-02-27 DIAGNOSIS — E785 Hyperlipidemia, unspecified: Secondary | ICD-10-CM

## 2020-02-27 MED ORDER — OXYCODONE-ACETAMINOPHEN 10-325 MG PO TABS: 1.0000 | ORAL_TABLET | Freq: Three times a day (TID) | ORAL | Status: DC | PRN

## 2020-02-27 NOTE — Progress Notes (Signed)
This visit occurred during the SARS-CoV-2 public health emergency.  Safety protocols were in place, including screening questions prior to the visit, additional usage of staff PPE, and extensive cleaning of exam room while observing appropriate contact time as indicated for disinfecting solutions.  I have personally reviewed the Medicare Annual Wellness questionnaire and have noted 1. The patient's medical and social history 2. Their use of alcohol, tobacco or illicit drugs 3. Their current medications and supplements 4. The patient's functional ability including ADL's, fall risks, home safety risks and hearing or visual             impairment. 5. Diet and physical activities 6. Evidence for depression or mood disorders  The patients weight, height, BMI have been recorded in the chart and visual acuity is per eye clinic.  I have made referrals, counseling and provided education to the patient based review of the above and I have provided the pt with a written personalized care plan for preventive services.  Provider list updated- see scanned forms.  Routine anticipatory guidance given to patient.  See health maintenance. The possibility exists that previously documented standard health maintenance information may have been brought forward from a previous encounter into this note.  If needed, that same information has been updated to reflect the current situation based on today's encounter.    Flu 2020 Shingles not due PNA not yet due Tetanus discussed with patient covid vaccine 2021 Colon cancer screening not yet due Prostate cancer screening not yet due Advance directive-wife designated patient were incapacitated. Cognitive function addressed- see scanned forms- and if abnormal then additional documentation follows.   Hearing grossly intact Prev EKG on chart  Mood d/w pt.  He had f/u with psych.  "I'm in a good place right now."  Compliant with meds.  No ADE on meds.  Chronic pain per  outside clinic. I'll defer.  He agrees.  He is putting up with pain as is.  He is stretching and exercising as tolerated, as pain allows.  D/w pt.    Elevated Cholesterol: Using medications without problems: yes Muscle aches: no Diet compliance: d/w pt.  Exercise: d/w pt.    He has some larger BMs with LLQ pain episodically.  Sx resolved now.  D/w pt about bowel regimen given his opiate use at baseline.    Hypothyroidism.  TSH wnl.  Compliant.  Labs d/w pt.    We talked about his labs and BMI, he wasn't fasting and he is working on cutting out sugars from his diet.    Fatty liver and LFT elevation d/w pt.  See above.    PMH and SH reviewed  Meds, vitals, and allergies reviewed.   ROS: Per HPI.  Unless specifically indicated otherwise in HPI, the patient denies:  General: fever. Eyes: acute vision changes [place]: sore throat Cardiovascular: chest pain Respiratory: SOB GI: vomiting GU: dysuria Musculoskeletal: acute back pain Derm: acute rash Neuro: acute motor dysfunction Psych: worsening mood Endocrine: polydipsia Heme: bleeding Allergy: hayfever  GEN: nad, alert and oriented HEENT: ncat NECK: supple w/o LA CV: rrr. PULM: ctab, no inc wob ABD: soft, +bs EXT: no edema SKIN: no acute rash

## 2020-02-27 NOTE — Patient Instructions (Addendum)
Please get fasting labs done in about 1-2 weeks, early AM appointment.  Don't change your regular meds for now but add on vitamin D 2000 IU a day.    Increase your fiber in the meantime.  Keep using miralax.    Recheck in about 3 months.   Take care.  Glad to see you.

## 2020-03-04 DIAGNOSIS — G894 Chronic pain syndrome: Secondary | ICD-10-CM | POA: Diagnosis not present

## 2020-03-04 DIAGNOSIS — M5442 Lumbago with sciatica, left side: Secondary | ICD-10-CM | POA: Diagnosis not present

## 2020-03-04 DIAGNOSIS — Z0000 Encounter for general adult medical examination without abnormal findings: Secondary | ICD-10-CM | POA: Insufficient documentation

## 2020-03-04 DIAGNOSIS — M546 Pain in thoracic spine: Secondary | ICD-10-CM | POA: Diagnosis not present

## 2020-03-04 DIAGNOSIS — Z978 Presence of other specified devices: Secondary | ICD-10-CM | POA: Diagnosis not present

## 2020-03-04 NOTE — Assessment & Plan Note (Signed)
Mood d/w pt.  He had f/u with psych.  "I'm in a good place right now."  Compliant with meds.  No ADE on meds. I'll defer, no change in meds.  He agrees.

## 2020-03-04 NOTE — Assessment & Plan Note (Signed)
Return for a.m. testosterone level in the near future we will go from there. Not on testosterone replacement at this point but he was in the past.

## 2020-03-04 NOTE — Assessment & Plan Note (Signed)
We talked about his labs and BMI, he wasn't fasting and he is working on cutting out sugars from his diet.   Continue statin for now.

## 2020-03-04 NOTE — Assessment & Plan Note (Signed)
Flu 2020 Shingles not due PNA not yet due Tetanus discussed with patient covid vaccine 2021 Colon cancer screening not yet due Prostate cancer screening not yet due Advance directive-wife designated patient were incapacitated. Cognitive function addressed- see scanned forms- and if abnormal then additional documentation follows.

## 2020-03-04 NOTE — Assessment & Plan Note (Signed)
Chronic pain per outside clinic. I'll defer.  He agrees.  He is putting up with pain as is.  He is stretching and exercising as tolerated, as pain allows.  D/w pt.

## 2020-03-04 NOTE — Assessment & Plan Note (Signed)
Hypothyroidism.  TSH wnl.  Compliant.  Labs d/w pt.  Continue as is.

## 2020-03-04 NOTE — Assessment & Plan Note (Signed)
Advance directive-wife designated patient were incapacitated. 

## 2020-03-13 ENCOUNTER — Other Ambulatory Visit: Payer: Medicare Other

## 2020-03-14 ENCOUNTER — Other Ambulatory Visit: Payer: Medicare Other

## 2020-03-14 DIAGNOSIS — Z883 Allergy status to other anti-infective agents status: Secondary | ICD-10-CM | POA: Diagnosis not present

## 2020-03-14 DIAGNOSIS — M5417 Radiculopathy, lumbosacral region: Secondary | ICD-10-CM | POA: Diagnosis not present

## 2020-03-14 DIAGNOSIS — Z888 Allergy status to other drugs, medicaments and biological substances status: Secondary | ICD-10-CM | POA: Diagnosis not present

## 2020-03-14 DIAGNOSIS — E079 Disorder of thyroid, unspecified: Secondary | ICD-10-CM | POA: Diagnosis not present

## 2020-03-14 DIAGNOSIS — Z87891 Personal history of nicotine dependence: Secondary | ICD-10-CM | POA: Diagnosis not present

## 2020-03-14 DIAGNOSIS — G894 Chronic pain syndrome: Secondary | ICD-10-CM | POA: Diagnosis not present

## 2020-03-14 DIAGNOSIS — K5909 Other constipation: Secondary | ICD-10-CM | POA: Diagnosis not present

## 2020-03-14 DIAGNOSIS — Z4549 Encounter for adjustment and management of other implanted nervous system device: Secondary | ICD-10-CM | POA: Diagnosis not present

## 2020-03-14 DIAGNOSIS — Z880 Allergy status to penicillin: Secondary | ICD-10-CM | POA: Diagnosis not present

## 2020-03-14 DIAGNOSIS — F3181 Bipolar II disorder: Secondary | ICD-10-CM | POA: Diagnosis not present

## 2020-03-14 DIAGNOSIS — Z79899 Other long term (current) drug therapy: Secondary | ICD-10-CM | POA: Diagnosis not present

## 2020-03-14 DIAGNOSIS — Z79890 Hormone replacement therapy (postmenopausal): Secondary | ICD-10-CM | POA: Diagnosis not present

## 2020-03-18 DIAGNOSIS — F5105 Insomnia due to other mental disorder: Secondary | ICD-10-CM | POA: Diagnosis not present

## 2020-03-18 DIAGNOSIS — F411 Generalized anxiety disorder: Secondary | ICD-10-CM | POA: Diagnosis not present

## 2020-03-18 DIAGNOSIS — F3131 Bipolar disorder, current episode depressed, mild: Secondary | ICD-10-CM | POA: Diagnosis not present

## 2020-03-18 DIAGNOSIS — F1421 Cocaine dependence, in remission: Secondary | ICD-10-CM | POA: Diagnosis not present

## 2020-03-22 DIAGNOSIS — F411 Generalized anxiety disorder: Secondary | ICD-10-CM | POA: Diagnosis not present

## 2020-03-22 DIAGNOSIS — M533 Sacrococcygeal disorders, not elsewhere classified: Secondary | ICD-10-CM | POA: Diagnosis not present

## 2020-03-28 DIAGNOSIS — Z451 Encounter for adjustment and management of infusion pump: Secondary | ICD-10-CM | POA: Diagnosis not present

## 2020-03-28 DIAGNOSIS — M5442 Lumbago with sciatica, left side: Secondary | ICD-10-CM | POA: Diagnosis not present

## 2020-03-28 DIAGNOSIS — M25561 Pain in right knee: Secondary | ICD-10-CM | POA: Diagnosis not present

## 2020-03-28 DIAGNOSIS — M47816 Spondylosis without myelopathy or radiculopathy, lumbar region: Secondary | ICD-10-CM | POA: Diagnosis not present

## 2020-03-28 DIAGNOSIS — Z978 Presence of other specified devices: Secondary | ICD-10-CM | POA: Diagnosis not present

## 2020-03-28 DIAGNOSIS — M533 Sacrococcygeal disorders, not elsewhere classified: Secondary | ICD-10-CM | POA: Diagnosis not present

## 2020-03-28 DIAGNOSIS — G894 Chronic pain syndrome: Secondary | ICD-10-CM | POA: Diagnosis not present

## 2020-03-28 DIAGNOSIS — M545 Low back pain: Secondary | ICD-10-CM | POA: Diagnosis not present

## 2020-03-28 DIAGNOSIS — Z79899 Other long term (current) drug therapy: Secondary | ICD-10-CM | POA: Diagnosis not present

## 2020-03-28 DIAGNOSIS — T85695A Other mechanical complication of other nervous system device, implant or graft, initial encounter: Secondary | ICD-10-CM | POA: Diagnosis not present

## 2020-04-04 ENCOUNTER — Other Ambulatory Visit: Payer: Medicare Other

## 2020-04-23 ENCOUNTER — Other Ambulatory Visit: Payer: Self-pay

## 2020-04-23 ENCOUNTER — Other Ambulatory Visit (INDEPENDENT_AMBULATORY_CARE_PROVIDER_SITE_OTHER): Payer: BC Managed Care – PPO

## 2020-04-23 DIAGNOSIS — N529 Male erectile dysfunction, unspecified: Secondary | ICD-10-CM

## 2020-04-24 ENCOUNTER — Other Ambulatory Visit: Payer: Self-pay | Admitting: Family Medicine

## 2020-04-24 DIAGNOSIS — E291 Testicular hypofunction: Secondary | ICD-10-CM

## 2020-04-24 LAB — GLUCOSE, RANDOM: Glucose: 140 mg/dL — ABNORMAL HIGH (ref 65–99)

## 2020-04-24 LAB — TESTOSTERONE: Testosterone: <3 ng/dL — ABNORMAL LOW (ref 264–916)

## 2020-05-04 ENCOUNTER — Other Ambulatory Visit: Payer: Self-pay | Admitting: Family Medicine

## 2020-05-04 DIAGNOSIS — E785 Hyperlipidemia, unspecified: Secondary | ICD-10-CM

## 2020-05-04 NOTE — Progress Notes (Signed)
05/07/2020 1:57 PM   Cody [name] 06/03/1971 [id number]  Referring provider: Joaquim Nam, MD [address] [place],  [place] [address] Chief Complaint  Patient presents with   Hypogonadism    New Patient    HPI: Cody [name] is a 49 y.o. male who presents today for evaluation and management of hypogonadism in male.   He last saw his PCP on 04/23/2020. He had no complaints. He was currently not on testosterone replacement but he was in the past. Testosterone was <3 at that time.    Testosterone was low at 53 back in 03/2011.   RUQ abdominal US form 06/2019 was negative for gallstones or biliary dilatation. Echogenic liver consistent with steatosis and or hepatocellular disease.  Brain MRI in 11/2019 was negative.   He reports retrograde ejaculation. He can achieve and maintain erections. He had significantly decreased libido. Patient does have his testicles. He notes whole body hair loss.   He notes some urgency and frequency.  This is not severe and he has minimal bothered by this.  He note eating healthy and having trouble keeping weight off.  He does report that he has a slightly decreased sense of smell.  Patient and his wife do not have any children. He reports spinal issues, depression and bipolar disorder and did not want pass it on to his children. In college he has numerous sexual encounters unprotected that could have resulted in offspring but never did.  He was treated in the remote past, approximately 10 years ago by his primary care physician with testosterone gel.  At that time, he recalls his numbers being around the 50s.  He stopped using the gel after a short period of time and never pursued any further work-up.  He is never seen an endocrinologist or urologist.  He is aware of any infertility issues or familial syndromes.   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; herniated disc L5- S1   Degenerative disk disease    Depression    Hyperlipidemia    Hypothyroidism    PTSD (post-traumatic stress disorder)    suicide attempt X1, gestureX 1    Surgical History: Past Surgical History:  Procedure Laterality Date   APPENDECTOMY     peritonitis   INTRATHECAL PUMP IMPLANTATION  2007   LAMINECTOMY  2004   arterial injury, transfused 7 units pc, implant surgery    MANDIBLE FRACTURE SURGERY     mugged   UPPER GASTROINTESTINAL ENDOSCOPY  2006   gastritis    Home Medications:  Allergies as of 05/07/2020      Reactions   Neurontin [gabapentin]    lethargic   Vancomycin    Swelling    Lithium    tremor   Mirtazapine    REACTION: lethargy ( Remeron)      Medication List       Accurate as of May 07, 2020  1:57 PM. If you have any questions, ask your nurse or doctor.        AMBULATORY NON FORMULARY MEDICATION Morphine Pump Implant   atorvastatin 10 MG tablet Commonly known as: LIPITOR TAKE 1 TABLET BY MOUTH  DAILY   busPIRone 10 MG tablet Commonly known as: BUSPAR Take 1 tablet (10 mg total) by mouth daily.   clindamycin 300 MG  capsule Commonly known as: CLEOCIN Take 300 mg by mouth 3 (three) times daily.   cloNIDine 0.1 MG tablet Commonly known as: CATAPRES Take 0.1 mg by mouth 3 (three) times daily as needed.   docusate sodium 100 MG capsule Commonly known as: COLACE Take 100 mg by mouth daily as needed.   escitalopram 20 MG tablet Commonly known as: Lexapro Take 0.5 tablets (10 mg total) by mouth daily.   hydrOXYzine 50 MG tablet Commonly known as: ATARAX/VISTARIL Take 50 mg by mouth 2 (two) times daily.   lamoTRIgine 200 MG tablet Commonly known as: LAMICTAL Take 1 tablet (200 mg total) by mouth 2 (two) times daily.   levothyroxine 75 MCG tablet Commonly known as: SYNTHROID TAKE 1 TABLET BY MOUTH  DAILY BEFORE BREAKFAST .   OVER THE COUNTER  MEDICATION   oxyCODONE-acetaminophen 10-325 MG tablet Commonly known as: PERCOCET Take 1 tablet by mouth every 8 (eight) hours as needed.   polyethylene glycol powder 17 GM/SCOOP powder Commonly known as: GLYCOLAX/MIRALAX Take 17 g by mouth daily as needed for moderate constipation.   Vitamin D 50 MCG (2000 UT) Caps Take 1 capsule by mouth.   ziprasidone 20 MG capsule Commonly known as: GEODON Take 20 mg by mouth 2 (two) times daily with a meal.       Allergies:  Allergies  Allergen Reactions   Neurontin [Gabapentin]     lethargic   Vancomycin     Swelling    Lithium     tremor   Mirtazapine     REACTION: lethargy ( Remeron)    Family History: Family History  Problem Relation Age of Onset   Stroke Father 67   Thyroid disease Father        hypo   Ulcers Mother    Depression Mother    Thyroid disease Mother        hypo   Lung cancer Maternal Grandmother    Thyroid disease Maternal Grandmother        hypo   Heart attack Maternal Grandfather 62   Esophageal cancer Maternal Grandfather    Colon cancer Neg Hx    Diabetes Neg Hx    Prostate cancer Neg Hx    Kidney cancer Neg Hx    Bladder Cancer Neg Hx     Social History:  reports that he quit smoking about 10 years ago. His smoking use included e-cigarettes. He smoked 0.25 packs per day. He has never used smokeless tobacco. He reports that he does not drink alcohol and does not use drugs.   Physical Exam: BP (!) 130/93    Pulse (!) 114    Ht 5\' 5"  (1.651 m)    Wt 191 lb (86.6 kg)    BMI 31.78 kg/m   Constitutional:  Alert and oriented, No acute distress. HEENT: [place] AT, moist mucus membranes.  Trachea midline, no masses. Cardiovascular: No clubbing, cyanosis, or edema. Respiratory: Normal respiratory effort, no increased work of breathing. GI: Abdomen is soft, nontender, nondistended, no abdominal masses.  Slight central obesity. GU: No CVA tenderness.  No bladder fullness or masses.   Patient with slight hidden circumcised penis.  Urethral meatus is patent.  No penile discharge. No penile lesions or rashes. Scrotum without lesions, cysts, rashes and/or edema.  Testicles are located atrophic bilaterally. No masses are appreciated in the testicles. Left and right epididymis are normal. Endocrine: Absence of arm leg and abdominal wall hair.  Normal male pattern facial hair without male pattern baldness. Skin:  No rashes, bruises or suspicious lesions. Neurologic: Grossly intact, no focal deficits, moving all 4 extremities. Psychiatric: Normal mood and affect.  Laboratory Data:  Lab Results  Component Value Date   CREATININE 1.26 02/19/2020    Lab Results  Component Value Date   TESTOSTERONE <3 (L) 04/23/2020    Lab Results  Component Value Date   HGBA1C 5.6 02/19/2020    Assessment & Plan:    1. 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  Will refer patient to Dr. Rolan Lipa at [place] the complexity of this case, he will be best served with a specialist in male andrology and/or endocrinology Patient agreeable with plan. 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 Hidden penis Encourage weight loss   [facility] [address] [place], [place] [address] [phone]  I, Theador Hawthorne, am acting as a scribe for Dr. Vanna Scotland.  I have reviewed the above documentation for accuracy and completeness, and I agree with the above.   Vanna Scotland, MD   I spent 45  total minutes on the day of the encounter including pre-visit review of the medical record, face-to-face time with the patient, and post visit ordering of labs/imaging/tests.

## 2020-05-05 ENCOUNTER — Other Ambulatory Visit: Payer: Self-pay | Admitting: Family Medicine

## 2020-05-05 DIAGNOSIS — R7989 Other specified abnormal findings of blood chemistry: Secondary | ICD-10-CM

## 2020-05-05 DIAGNOSIS — E559 Vitamin D deficiency, unspecified: Secondary | ICD-10-CM

## 2020-05-06 DIAGNOSIS — M5136 Other intervertebral disc degeneration, lumbar region: Secondary | ICD-10-CM | POA: Diagnosis not present

## 2020-05-06 DIAGNOSIS — K5903 Drug induced constipation: Secondary | ICD-10-CM | POA: Diagnosis not present

## 2020-05-06 DIAGNOSIS — M5441 Lumbago with sciatica, right side: Secondary | ICD-10-CM | POA: Diagnosis not present

## 2020-05-06 DIAGNOSIS — Z79899 Other long term (current) drug therapy: Secondary | ICD-10-CM | POA: Diagnosis not present

## 2020-05-06 DIAGNOSIS — Z978 Presence of other specified devices: Secondary | ICD-10-CM | POA: Diagnosis not present

## 2020-05-06 DIAGNOSIS — F1190 Opioid use, unspecified, uncomplicated: Secondary | ICD-10-CM | POA: Diagnosis not present

## 2020-05-06 DIAGNOSIS — Z5181 Encounter for therapeutic drug level monitoring: Secondary | ICD-10-CM | POA: Diagnosis not present

## 2020-05-07 ENCOUNTER — Other Ambulatory Visit: Payer: Self-pay

## 2020-05-07 ENCOUNTER — Ambulatory Visit (INDEPENDENT_AMBULATORY_CARE_PROVIDER_SITE_OTHER): Payer: BC Managed Care – PPO | Admitting: Urology

## 2020-05-07 ENCOUNTER — Encounter: Payer: Self-pay | Admitting: Urology

## 2020-05-07 VITALS — BP 130/93 | HR 114 | Ht 65.0 in | Wt 191.0 lb

## 2020-05-07 DIAGNOSIS — E291 Testicular hypofunction: Secondary | ICD-10-CM | POA: Diagnosis not present

## 2020-05-07 DIAGNOSIS — R350 Frequency of micturition: Secondary | ICD-10-CM | POA: Diagnosis not present

## 2020-05-08 LAB — MICROSCOPIC EXAMINATION: Bacteria, UA: NONE SEEN

## 2020-05-08 LAB — FSH/LH
FSH: 2.3 m[IU]/mL (ref 1.5–12.4)
LH: 1.5 m[IU]/mL — ABNORMAL LOW (ref 1.7–8.6)

## 2020-05-08 LAB — URINALYSIS, COMPLETE
Bilirubin, UA: NEGATIVE
Glucose, UA: NEGATIVE
Leukocytes,UA: NEGATIVE
Nitrite, UA: NEGATIVE
RBC, UA: NEGATIVE
Specific Gravity, UA: 1.015 (ref 1.005–1.030)
Urobilinogen, Ur: 1 mg/dL (ref 0.2–1.0)
pH, UA: 7 (ref 5.0–7.5)

## 2020-05-08 LAB — TESTOSTERONE: Testosterone: <3 ng/dL — ABNORMAL LOW (ref 264–916)

## 2020-05-08 LAB — ESTRADIOL: Estradiol: <5 pg/mL — ABNORMAL LOW (ref 7.6–42.6)

## 2020-05-08 LAB — PROLACTIN: Prolactin: 5.8 ng/mL (ref 4.0–15.2)

## 2020-05-09 ENCOUNTER — Telehealth: Payer: Self-pay

## 2020-05-09 NOTE — Telephone Encounter (Signed)
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  This is fairly rare.  As we discussed, agree with referral to [facility] andrology, Dr. Rolan Lipa for further evaluation and treatment.

## 2020-05-09 NOTE — Telephone Encounter (Signed)
mychart notification sent 

## 2020-05-20 ENCOUNTER — Telehealth: Payer: Self-pay

## 2020-05-20 ENCOUNTER — Other Ambulatory Visit (INDEPENDENT_AMBULATORY_CARE_PROVIDER_SITE_OTHER): Payer: BC Managed Care – PPO

## 2020-05-20 ENCOUNTER — Other Ambulatory Visit: Payer: Self-pay

## 2020-05-20 DIAGNOSIS — E230 Hypopituitarism: Secondary | ICD-10-CM

## 2020-05-20 DIAGNOSIS — R7989 Other specified abnormal findings of blood chemistry: Secondary | ICD-10-CM | POA: Diagnosis not present

## 2020-05-20 DIAGNOSIS — E559 Vitamin D deficiency, unspecified: Secondary | ICD-10-CM

## 2020-05-20 NOTE — Telephone Encounter (Signed)
Patient called wanting to follow up on [facility] referral to see Dr. Rolan Lipa. Patient states he will be out of town next week. Can you check on the status of his referral?

## 2020-05-20 NOTE — Addendum Note (Signed)
Addended by: Alvina Chou on: 05/20/2020 07:46 AM   Modules accepted: Orders

## 2020-05-21 LAB — HEPATIC FUNCTION PANEL
ALT: 33 IU/L (ref 0–44)
AST: 25 IU/L (ref 0–40)
Albumin: 4.6 g/dL (ref 4.0–5.0)
Alkaline Phosphatase: 101 IU/L (ref 44–121)
Bilirubin Total: 0.4 mg/dL (ref 0.0–1.2)
Bilirubin, Direct: <0.1 mg/dL (ref 0.00–0.40)
Total Protein: 6.8 g/dL (ref 6.0–8.5)

## 2020-05-21 LAB — VITAMIN D 25 HYDROXY (VIT D DEFICIENCY, FRACTURES): Vit D, 25-Hydroxy: 34.3 ng/mL (ref 30.0–100.0)

## 2020-05-21 NOTE — Telephone Encounter (Signed)
Referral faxed today.

## 2020-05-21 NOTE — Telephone Encounter (Signed)
There is not a referral for him to [facility]

## 2020-05-21 NOTE — Telephone Encounter (Signed)
I entered the referral thanks for checking

## 2020-05-24 ENCOUNTER — Encounter: Payer: Self-pay | Admitting: Family Medicine

## 2020-05-24 ENCOUNTER — Other Ambulatory Visit: Payer: Self-pay

## 2020-05-24 ENCOUNTER — Ambulatory Visit (INDEPENDENT_AMBULATORY_CARE_PROVIDER_SITE_OTHER): Payer: BC Managed Care – PPO | Admitting: Family Medicine

## 2020-05-24 DIAGNOSIS — R7989 Other specified abnormal findings of blood chemistry: Secondary | ICD-10-CM

## 2020-05-24 DIAGNOSIS — E559 Vitamin D deficiency, unspecified: Secondary | ICD-10-CM

## 2020-05-24 NOTE — Patient Instructions (Signed)
Don't change your meds for now.  I'll await the notes from [facility] and we'll go from there.  Take care.  Glad to see you.

## 2020-05-24 NOTE — Progress Notes (Signed)
This visit occurred during the SARS-CoV-2 public health emergency.  Safety protocols were in place, including screening questions prior to the visit, additional usage of staff PPE, and extensive cleaning of exam room while observing appropriate contact time as indicated for disinfecting solutions.  Recent vit D and LFTs wnl.  D/w pt.    He is going to f/u with [facility] re: low T.  LH low with normal MRI of the brain.  He had fatigue and hypogonadism.  D/w pt.  We talked about questions to ask [facility] re: cause and tx, namely potential causes and treatment options.  Would consider treatment options and is reasonable to consider risks and benefits.  He will ask for [facility] input.  We talked about BMI exclusion and diet/exercise as his conditions allow.  Form done.  He had opiate pump replacement in the meantime.  Scar on R side of abd is healing.    Meds, vitals, and allergies reviewed.   ROS: Per HPI unless specifically indicated in ROS section   GEN: nad, alert and oriented HEENT: ncat NECK: supple w/o LA CV: rrr.  PULM: ctab, no inc wob ABD: soft, +bs, healing scar at opiate pump site on the right side of the abdomen.  He does have central adiposity. EXT: no edema SKIN: no acute rash

## 2020-05-27 NOTE — Assessment & Plan Note (Signed)
With follow-up pending at [facility].  I need [facility] input.  See above.  Discussed.  He agrees.

## 2020-05-27 NOTE — Assessment & Plan Note (Signed)
Recent vitamin D normal.  Continue as is.

## 2020-05-31 ENCOUNTER — Telehealth: Payer: Self-pay | Admitting: Family Medicine

## 2020-05-31 DIAGNOSIS — E785 Hyperlipidemia, unspecified: Secondary | ICD-10-CM | POA: Diagnosis not present

## 2020-05-31 DIAGNOSIS — K5792 Diverticulitis of intestine, part unspecified, without perforation or abscess without bleeding: Secondary | ICD-10-CM | POA: Diagnosis not present

## 2020-05-31 DIAGNOSIS — R112 Nausea with vomiting, unspecified: Secondary | ICD-10-CM | POA: Diagnosis not present

## 2020-05-31 DIAGNOSIS — R197 Diarrhea, unspecified: Secondary | ICD-10-CM | POA: Diagnosis not present

## 2020-05-31 NOTE — Telephone Encounter (Signed)
PT returning phone call from DR. Para March. And also he went to urgent care and was diagnosed with diverticulitis.

## 2020-05-31 NOTE — Telephone Encounter (Signed)
Please see what details you can get and see about current condition of patient. Thanks.

## 2020-05-31 NOTE — Telephone Encounter (Signed)
Patient went to [facility] and was diagnosed with diverticulitis.  He was given Cipro and Flagyl as well as Phenergan and Dicyclomine and Imodium for the diarrhea.  Patient says this has been going on for 5 or 6 days.  Patient will be on this medication for about 10 days.  He appreciates the call and will let us know if there is anything we can do.

## 2020-05-31 NOTE — Telephone Encounter (Signed)
Noted. Thanks.

## 2020-06-06 ENCOUNTER — Ambulatory Visit: Payer: Medicare Other | Admitting: Family Medicine

## 2020-06-06 DIAGNOSIS — E291 Testicular hypofunction: Secondary | ICD-10-CM | POA: Diagnosis not present

## 2020-06-11 DIAGNOSIS — F5105 Insomnia due to other mental disorder: Secondary | ICD-10-CM | POA: Diagnosis not present

## 2020-06-11 DIAGNOSIS — F411 Generalized anxiety disorder: Secondary | ICD-10-CM | POA: Diagnosis not present

## 2020-06-11 DIAGNOSIS — F1421 Cocaine dependence, in remission: Secondary | ICD-10-CM | POA: Diagnosis not present

## 2020-06-11 DIAGNOSIS — F3131 Bipolar disorder, current episode depressed, mild: Secondary | ICD-10-CM | POA: Diagnosis not present

## 2020-06-13 ENCOUNTER — Ambulatory Visit (INDEPENDENT_AMBULATORY_CARE_PROVIDER_SITE_OTHER): Payer: BC Managed Care – PPO | Admitting: Family Medicine

## 2020-06-13 ENCOUNTER — Other Ambulatory Visit: Payer: Self-pay

## 2020-06-13 ENCOUNTER — Encounter: Payer: Self-pay | Admitting: Family Medicine

## 2020-06-13 VITALS — BP 120/80 | HR 99 | Temp 95.2°F | Ht 65.0 in | Wt 201.0 lb

## 2020-06-13 DIAGNOSIS — K921 Melena: Secondary | ICD-10-CM | POA: Diagnosis not present

## 2020-06-13 DIAGNOSIS — R7989 Other specified abnormal findings of blood chemistry: Secondary | ICD-10-CM

## 2020-06-13 DIAGNOSIS — R197 Diarrhea, unspecified: Secondary | ICD-10-CM

## 2020-06-13 NOTE — Patient Instructions (Addendum)
Let me check with urology and we'll go from there.  Take care.  Glad to see you.  Go to the lab on the way out.   If you have mychart we'll likely use that to update you.

## 2020-06-13 NOTE — Progress Notes (Signed)
This visit occurred during the SARS-CoV-2 public health emergency.  Safety protocols were in place, including screening questions prior to the visit, additional usage of staff PPE, and extensive cleaning of exam room while observing appropriate contact time as indicated for disinfecting solutions.  He had f/u with [facility] uro in the meantime.  He is awaiting follow up from urology locally.  I told him I would check with Dr. Apolinar Junes about follow up.  He would prefer injection treatment instead of creams or gels, for testosterone replacement.  He had to put off his trip to [place].  His mother in law is on hospice, d/w pt about end of life care.    He had morphine pump replacement but had diarrhea in the meantime.  He was on cipro and flagyl prev.  Cramping at the time of BM but not otherwise.  No fevers.  His stools are black/darker than normal.   Meds, vitals, and allergies reviewed.  ROS: Per HPI unless specifically indicated in ROS section   GEN: nad, alert and oriented HEENT: ncat NECK: supple w/o LA CV: rrr PULM: ctab, no inc wob ABD: soft, +bs, well healed scar on R side of abd.  No rebound.  Not ttp.   EXT: no edema SKIN: well perfused.

## 2020-06-14 ENCOUNTER — Ambulatory Visit: Payer: Medicare Other | Admitting: Family Medicine

## 2020-06-14 DIAGNOSIS — F419 Anxiety disorder, unspecified: Secondary | ICD-10-CM | POA: Diagnosis not present

## 2020-06-14 DIAGNOSIS — M533 Sacrococcygeal disorders, not elsewhere classified: Secondary | ICD-10-CM | POA: Diagnosis not present

## 2020-06-14 LAB — CBC WITH DIFFERENTIAL/PLATELET
Basophils Absolute: 0.1 10*3/uL (ref 0.0–0.2)
Basos: 1 %
EOS (ABSOLUTE): 0.3 10*3/uL (ref 0.0–0.4)
Eos: 3 %
Hematocrit: 43.9 % (ref 37.5–51.0)
Hemoglobin: 15.1 g/dL (ref 13.0–17.7)
Immature Grans (Abs): 0 10*3/uL (ref 0.0–0.1)
Immature Granulocytes: 0 %
Lymphocytes Absolute: 2.3 10*3/uL (ref 0.7–3.1)
Lymphs: 26 %
MCH: 32 pg (ref 26.6–33.0)
MCHC: 34.4 g/dL (ref 31.5–35.7)
MCV: 93 fL (ref 79–97)
Monocytes Absolute: 0.5 10*3/uL (ref 0.1–0.9)
Monocytes: 6 %
Neutrophils Absolute: 5.6 10*3/uL (ref 1.4–7.0)
Neutrophils: 64 %
Platelets: 278 10*3/uL (ref 150–450)
RBC: 4.72 x10E6/uL (ref 4.14–5.80)
RDW: 12.2 % (ref 11.6–15.4)
WBC: 8.8 10*3/uL (ref 3.4–10.8)

## 2020-06-14 LAB — COMPREHENSIVE METABOLIC PANEL
ALT: 30 IU/L (ref 0–44)
AST: 26 IU/L (ref 0–40)
Albumin/Globulin Ratio: 2.1 (ref 1.2–2.2)
Albumin: 4.6 g/dL (ref 4.0–5.0)
Alkaline Phosphatase: 92 IU/L (ref 44–121)
BUN/Creatinine Ratio: 9 (ref 9–20)
BUN: 8 mg/dL (ref 6–24)
Bilirubin Total: 0.3 mg/dL (ref 0.0–1.2)
CO2: 26 mmol/L (ref 20–29)
Calcium: 9 mg/dL (ref 8.7–10.2)
Chloride: 104 mmol/L (ref 96–106)
Creatinine, Ser: 0.89 mg/dL (ref 0.76–1.27)
GFR calc Af Amer: 117 mL/min/{1.73_m2} (ref >59)
GFR calc non Af Amer: 101 mL/min/{1.73_m2} (ref >59)
Globulin, Total: 2.2 g/dL (ref 1.5–4.5)
Glucose: 120 mg/dL — ABNORMAL HIGH (ref 65–99)
Potassium: 4.4 mmol/L (ref 3.5–5.2)
Sodium: 144 mmol/L (ref 134–144)
Total Protein: 6.8 g/dL (ref 6.0–8.5)

## 2020-06-16 DIAGNOSIS — R197 Diarrhea, unspecified: Secondary | ICD-10-CM | POA: Insufficient documentation

## 2020-06-16 NOTE — Assessment & Plan Note (Signed)
I told him I would check with Dr. Apolinar Junes about follow up.  He would prefer injection treatment instead of creams or gels, for testosterone replacement.  I need urology input.  I appreciate the help of all involved.

## 2020-06-16 NOTE — Assessment & Plan Note (Signed)
With darker/black stools.  Fecal occult blood test pending.  Check CBC today.  Unclear if this is related to recent antibiotics versus diarrhea versus both.  If his fecal occult blood test is positive then we can refer to GI.  See notes on labs.  Benign abdominal exam, okay for outpatient follow-up.

## 2020-06-17 ENCOUNTER — Other Ambulatory Visit: Payer: Self-pay | Admitting: Family Medicine

## 2020-06-17 LAB — GI PROFILE, STOOL, PCR

## 2020-06-17 MED ORDER — CIPROFLOXACIN HCL 500 MG PO TABS: 500.0000 mg | ORAL_TABLET | Freq: Two times a day (BID) | ORAL | 0 refills | Status: DC

## 2020-06-18 ENCOUNTER — Ambulatory Visit: Payer: Medicare Other | Admitting: Family Medicine

## 2020-06-20 LAB — FECAL OCCULT BLOOD, IMMUNOCHEMICAL: Fecal Occult Bld: NEGATIVE

## 2020-06-24 ENCOUNTER — Encounter: Payer: Self-pay | Admitting: Family Medicine

## 2020-06-27 ENCOUNTER — Telehealth: Payer: Self-pay | Admitting: *Deleted

## 2020-06-27 NOTE — Telephone Encounter (Signed)
Pt calling asking about his referral to [facility], ( I inboxed you the results) pt is going out of the country on 07/15/2020 and would like some help before then.  ( I scheduled him with you on 07/10/20 @10 :45)

## 2020-07-03 ENCOUNTER — Telehealth: Payer: Self-pay | Admitting: Family Medicine

## 2020-07-03 NOTE — Telephone Encounter (Signed)
Pt called in wanted to let Dr. Para March know he will not see Dr.Brandon and he is going to see Dr. Lonna Cobb on 07/08/20

## 2020-07-03 NOTE — Telephone Encounter (Signed)
Noted.  Will await consult report.  Thanks.

## 2020-07-08 ENCOUNTER — Telehealth: Payer: Self-pay

## 2020-07-08 ENCOUNTER — Encounter: Payer: Self-pay | Admitting: Urology

## 2020-07-08 ENCOUNTER — Other Ambulatory Visit: Payer: Self-pay

## 2020-07-08 ENCOUNTER — Ambulatory Visit (INDEPENDENT_AMBULATORY_CARE_PROVIDER_SITE_OTHER): Payer: BC Managed Care – PPO | Admitting: Urology

## 2020-07-08 VITALS — BP 126/88 | HR 109 | Ht 65.0 in | Wt 185.0 lb

## 2020-07-08 DIAGNOSIS — E291 Testicular hypofunction: Secondary | ICD-10-CM

## 2020-07-08 MED ORDER — TESTOSTERONE CYPIONATE 200 MG/ML IM SOLN: 200.0000 mg | INTRAMUSCULAR | 0 refills | Status: DC

## 2020-07-08 NOTE — Progress Notes (Signed)
07/08/2020 8:34 AM   Cody [name] October 27, 1970 [id number]  Referring provider: Joaquim Nam, MD [address] [place],  [place] [address]  Chief Complaint  Patient presents with  . Hypogonadism    HPI: 49 y.o. male previous seen by Dr. Apolinar Junes for hypogonadism presents to discuss TRT   Castrate testosterone levels  Mark symptoms  Was referred to Dr. Rolan Lipa at [facility] regarding significant low testosterone and testicular atrophy and it was felt his low testosterone was secondary to chronic opiate therapy  LH was low however pituitary MRI unremarkable   PMH: Past Medical History:  Diagnosis Date  . Anxiety   . Bipolar 2 disorder (HCC)   . Chronic back pain   . 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 Degenerative disk disease   . Depression   . Hyperlipidemia   . Hypothyroidism   . PTSD (post-traumatic stress disorder)    suicide attempt X1, gestureX 1    Surgical History: Past Surgical History:  Procedure Laterality Date  . APPENDECTOMY     peritonitis  . INTRATHECAL PUMP IMPLANTATION  2007  . LAMINECTOMY  2004   arterial injury, transfused 7 units pc, implant surgery   . MANDIBLE FRACTURE SURGERY     mugged  . UPPER GASTROINTESTINAL ENDOSCOPY  2006   gastritis    Home Medications:  Allergies as of 07/08/2020      Reactions   Neurontin [gabapentin]    lethargic   Vancomycin    Swelling    Lithium    tremor   Mirtazapine    REACTION: lethargy ( Remeron)      Medication List       Accurate as of July 08, 2020  8:34 AM. If you have any questions, ask your nurse or doctor.        STOP taking these medications   ciprofloxacin 500 MG tablet Commonly known as: Cipro Stopped by: Riki Altes, MD   cloNIDine 0.1 MG tablet Commonly known as: CATAPRES Stopped by: Riki Altes, MD   docusate sodium 100 MG capsule Commonly known as: COLACE Stopped by: Riki Altes, MD     TAKE  these medications   AMBULATORY NON FORMULARY MEDICATION Morphine Pump Implant   atorvastatin 10 MG tablet Commonly known as: LIPITOR TAKE 1 TABLET BY MOUTH  DAILY   busPIRone 10 MG tablet Commonly known as: BUSPAR Take 1 tablet (10 mg total) by mouth daily.   escitalopram 20 MG tablet Commonly known as: Lexapro Take 0.5 tablets (10 mg total) by mouth daily.   hydrOXYzine 50 MG tablet Commonly known as: ATARAX/VISTARIL Take 50 mg by mouth 2 (two) times daily.   lamoTRIgine 200 MG tablet Commonly known as: LAMICTAL Take 1 tablet (200 mg total) by mouth 2 (two) times daily.   levothyroxine 75 MCG tablet Commonly known as: SYNTHROID TAKE 1 TABLET BY MOUTH  DAILY BEFORE BREAKFAST .   OVER THE COUNTER MEDICATION   oxyCODONE-acetaminophen 10-325 MG tablet Commonly known as: PERCOCET Take 1 tablet by mouth every 8 (eight) hours as needed.   polyethylene glycol powder 17 GM/SCOOP powder Commonly known as: GLYCOLAX/MIRALAX Take 17 g by mouth daily as needed for moderate constipation.   Vitamin D 50 MCG (2000 UT) Caps Take 1 capsule by mouth.   ziprasidone 20 MG capsule Commonly known as: GEODON Take 20 mg by mouth 2 (two) times daily with a meal.  Allergies:  Allergies  Allergen Reactions  . Neurontin [Gabapentin]     lethargic  . Vancomycin     Swelling   . Lithium     tremor  . Mirtazapine     REACTION: lethargy ( Remeron)    Family History: Family History  Problem Relation Age of Onset  . Stroke Father 95  . Thyroid disease Father        hypo  . Ulcers Mother   . Depression Mother   . Thyroid disease Mother        hypo  . Lung cancer Maternal Grandmother   . Thyroid disease Maternal Grandmother        hypo  . Heart attack Maternal Grandfather 55  . Esophageal cancer Maternal Grandfather   . Colon cancer Neg Hx   . Diabetes Neg Hx   . Prostate cancer Neg Hx   . Kidney cancer Neg Hx   . Bladder Cancer Neg Hx     Social History:  reports  that he quit smoking about 10 years ago. His smoking use included e-cigarettes. He smoked 0.25 packs per day. He has never used smokeless tobacco. He reports that he does not drink alcohol and does not use drugs.   Physical Exam: BP 126/88   Pulse (!) 109   Ht 5\' 5"  (1.651 m)   Wt 185 lb (83.9 kg)   BMI 30.79 kg/m   Constitutional:  Alert and oriented, No acute distress. HEENT: [place] AT, moist mucus membranes.  Trachea midline, no masses. Cardiovascular: No clubbing, cyanosis, or edema. Respiratory: Normal respiratory effort, no increased work of breathing. GU: Testes descended bilaterally L ~8 cc; R ~10cc Skin: No rashes, bruises or suspicious lesions. Neurologic: Grossly intact, no focal deficits, moving all 4 extremities. Psychiatric: Normal mood and affect.   Assessment & Plan:    1.  Hypogonadism  Profound low testosterone levels  Will start testosterone cypionate 200 mg every 2 weeks  Rx sent to pharmacy  He states Dr. office will administer testosterone  Recommend follow-up visit for testosterone level, symptom check 8 weeks after starting therapy  Potential side effects of testosterone replacement were discussed including stimulation of benign prostatic growth with lower urinary tract symptoms; erythrocytosis; edema; gynecomastia; worsening sleep apnea; venous thromboembolism; testicular atrophy and infertility. Recent studies suggesting an increased incidence of heart attack and stroke in patients taking testosterone was discussed. He was informed there is conflicting evidence regarding the impact of testosterone therapy on cardiovascular risk. The theoretical risk of growth stimulation of an undetected prostate cancer was also discussed.  He was informed that current evidence does not provide any definitive answers regarding the risks of testosterone therapy on prostate cancer and cardiovascular disease. The need for periodic monitoring of his testosterone level,  PSA, hematocrit and DRE was discussed.     Dwana Curd, MD  [facility] [address] [place], [place] [phone]

## 2020-07-08 NOTE — Telephone Encounter (Signed)
Pt calls and states that he would like to be taught how to administer testosterone injections. Pt states that he will likely have injections administered by his PCP's office but would like to have the knowledge for himself in the event their office is unable to administer. Pt scheduled for injection training appointment.

## 2020-07-10 ENCOUNTER — Ambulatory Visit (INDEPENDENT_AMBULATORY_CARE_PROVIDER_SITE_OTHER): Payer: BC Managed Care – PPO

## 2020-07-10 ENCOUNTER — Other Ambulatory Visit: Payer: Self-pay

## 2020-07-10 ENCOUNTER — Ambulatory Visit: Payer: Self-pay | Admitting: Urology

## 2020-07-10 DIAGNOSIS — E291 Testicular hypofunction: Secondary | ICD-10-CM | POA: Diagnosis not present

## 2020-07-10 MED ORDER — TESTOSTERONE CYPIONATE 200 MG/ML IM SOLN
200.0000 mg | Freq: Once | INTRAMUSCULAR | Status: AC
  Administered 2020-07-10: 200 mg via INTRAMUSCULAR

## 2020-07-10 NOTE — Progress Notes (Signed)
Per orders of Dr. Sharen Hones in Dr. Armanda Heritage absence, injection of testosterone in R upper outer quadrant given by Sherrie George. Patient tolerated injection well. Testosterone ordered by urologist but authorized by Dr. Para March to give at this clinic until pt can administer at home. Dr. Sharen Hones will sign order due to Dr. Para March being out of office.     Joaquim Nam, MD  Karenann Cai, RN Please check with patient to see if he needs to be on the RN schedule for his testosterone shot. If so, please schedule. It looks like rx was done by urology. He would need to bring the rx here. Thanks.    Clelia Croft

## 2020-07-12 DIAGNOSIS — Z03818 Encounter for observation for suspected exposure to other biological agents ruled out: Secondary | ICD-10-CM | POA: Diagnosis not present

## 2020-07-22 NOTE — Progress Notes (Signed)
Cody [name] is a 49 y.o.  male with testosterone deficiency who presents today for instruction on delivering an IM injection of testosterone cypionate.    I instructed the patient to identify the concentration of his testosterone. Testosterone for injection is usually in the form of testosterone cypionate. These liquids come in multiple concentrations, so before giving an injection, it's very important to make sure that his intended dosage takes into account the concentration of the testosterone serum. Usually, testosterone comes in a concentration of either 100 mg/ml or 200 mg/ml.  We typically use the 200 mg/mL in this office.    Using a sterile, 18 G needle and 3 cc syringe, the testosterone cypionate (lot #[id number]  Exp:10/2022) was drawn up for a 200  cc injection.  To draw up the dose, I demonstrated how to first draw air into the syringe equal to the volume of the dosage. Then, wipe the top of the medication bottle with an alcohol wipe, insert the needle through the lid and into the medication, and push the air from your syringe into the bottle. Turn the bottle upside down and draw out the exact dosage of testosterone.  I demonstrated how to aspirate the syringe by hinge the syringe with its needle uncapped and pointing up in front of him.  Looking for air bubbles in the syringe. Flick the side of the syringe to get these bubbles to rise to the top.    When the dosage is bubble-free, I slowly depressed the plunger to force the air at the top of the syringe out stopping when a tiny drop of medication comes out of the tip of the syringe.  I advised him to be certain no air remained in the syringe as injecting air is very dangerous.  Being careful not to squirt or spray a significant portion of the dosage onto the floor.  Preparing the injection site, outer middle third of the vastus lateralis muscle of the thigh, I took a sterile alcohol pad and wipe the immediate area around where I intended him  to inject.   I then demonstrated how to change the needle from the 18 G to the 21 G needle.  I then gave him the syringe for injecting.  He correctly identified the injection location.  He held the syringe like a dart at a 90-degree angle above the sterile injection site. Quickly plunged it into the flesh. Before depressing the plunger, he drew back on it slightly and no blood was seen.  I advised him that if blood flashed in the syringe, he would need to remove the needle and then select a different location as he was in the vein.  Inject the medication at a steady, controlled pace.  He fully depressed the plunger, slowly pull the needle out. Pressing around the injection site with a sterile cotton swab as he did so - this preventing the emerging needle from pulling on the skin and causing extra pain.  We assessed the needle entry point for bleeding, and applied a sterile Band-Aid and/or cotton swab if needed. Disposed of the used needle and syringe in a proper sharps container.  I advised him to acquire a sharps container for his personal use at home.  I advised him that If, after injection, he experienced redness, swelling, or discomfort beyond that of normal soreness at the site of injection, call our office for an appointment and instructions.  He is to always store his medication at the recommended temperature, and always  check the expiration date on the bottle. If it's expired, don't use it.  Of course, keep all of med's out of reach of children.  Do not change his dose without consulting your provider.  His starting dose will be 1 cc every 2 weeks.  He will return 1 week after his fourth injection for a testosterone level  Jenny Lai, PA-C  I spent 20 minutes on the day of the encounter to include pre-visit record review, face-to-face time with the patient, and post-visit ordering of tests.

## 2020-07-23 ENCOUNTER — Ambulatory Visit (INDEPENDENT_AMBULATORY_CARE_PROVIDER_SITE_OTHER): Payer: BC Managed Care – PPO | Admitting: Urology

## 2020-07-23 ENCOUNTER — Other Ambulatory Visit: Payer: Self-pay

## 2020-07-23 ENCOUNTER — Encounter: Payer: Self-pay | Admitting: Urology

## 2020-07-23 VITALS — BP 152/93 | HR 93 | Ht 65.0 in | Wt 185.0 lb

## 2020-07-23 DIAGNOSIS — E291 Testicular hypofunction: Secondary | ICD-10-CM | POA: Diagnosis not present

## 2020-07-24 ENCOUNTER — Other Ambulatory Visit: Payer: Self-pay | Admitting: Family Medicine

## 2020-08-05 DIAGNOSIS — M47816 Spondylosis without myelopathy or radiculopathy, lumbar region: Secondary | ICD-10-CM | POA: Diagnosis not present

## 2020-08-05 DIAGNOSIS — Z978 Presence of other specified devices: Secondary | ICD-10-CM | POA: Diagnosis not present

## 2020-08-05 DIAGNOSIS — T85610S Breakdown (mechanical) of epidural and subdural infusion catheter, sequela: Secondary | ICD-10-CM | POA: Diagnosis not present

## 2020-08-05 DIAGNOSIS — F1190 Opioid use, unspecified, uncomplicated: Secondary | ICD-10-CM | POA: Diagnosis not present

## 2020-08-05 DIAGNOSIS — M533 Sacrococcygeal disorders, not elsewhere classified: Secondary | ICD-10-CM | POA: Diagnosis not present

## 2020-08-13 DIAGNOSIS — F1421 Cocaine dependence, in remission: Secondary | ICD-10-CM | POA: Diagnosis not present

## 2020-08-13 DIAGNOSIS — F609 Personality disorder, unspecified: Secondary | ICD-10-CM | POA: Diagnosis not present

## 2020-08-13 DIAGNOSIS — F410 Panic disorder [episodic paroxysmal anxiety] without agoraphobia: Secondary | ICD-10-CM | POA: Diagnosis not present

## 2020-08-13 DIAGNOSIS — F3131 Bipolar disorder, current episode depressed, mild: Secondary | ICD-10-CM | POA: Diagnosis not present

## 2020-08-13 DIAGNOSIS — F5105 Insomnia due to other mental disorder: Secondary | ICD-10-CM | POA: Diagnosis not present

## 2020-08-13 DIAGNOSIS — F411 Generalized anxiety disorder: Secondary | ICD-10-CM | POA: Diagnosis not present

## 2020-08-13 DIAGNOSIS — F3132 Bipolar disorder, current episode depressed, moderate: Secondary | ICD-10-CM | POA: Diagnosis not present

## 2020-08-19 DIAGNOSIS — F1190 Opioid use, unspecified, uncomplicated: Secondary | ICD-10-CM | POA: Diagnosis not present

## 2020-08-19 DIAGNOSIS — M533 Sacrococcygeal disorders, not elsewhere classified: Secondary | ICD-10-CM | POA: Diagnosis not present

## 2020-08-19 DIAGNOSIS — M47816 Spondylosis without myelopathy or radiculopathy, lumbar region: Secondary | ICD-10-CM | POA: Diagnosis not present

## 2020-08-19 DIAGNOSIS — M545 Low back pain, unspecified: Secondary | ICD-10-CM | POA: Diagnosis not present

## 2020-08-27 ENCOUNTER — Other Ambulatory Visit: Payer: Self-pay

## 2020-08-27 ENCOUNTER — Other Ambulatory Visit: Payer: Self-pay | Admitting: Family Medicine

## 2020-08-27 DIAGNOSIS — E291 Testicular hypofunction: Secondary | ICD-10-CM

## 2020-08-28 ENCOUNTER — Other Ambulatory Visit: Payer: Medicare Other

## 2020-08-29 ENCOUNTER — Telehealth: Payer: Self-pay | Admitting: Family Medicine

## 2020-08-29 ENCOUNTER — Other Ambulatory Visit: Payer: Medicare Other

## 2020-08-29 DIAGNOSIS — E291 Testicular hypofunction: Secondary | ICD-10-CM

## 2020-08-29 NOTE — Telephone Encounter (Signed)
Patient aware and lab appt scheduled for tomorrow at 1:30 pm

## 2020-08-29 NOTE — Telephone Encounter (Signed)
Please schedule labs when possible, I put in the future testosterone order.  Thanks.

## 2020-08-29 NOTE — Telephone Encounter (Signed)
Patient needs to have testosterone labs drawn. Urologist told him he could just have them done at the primary care office since he's got an appt schedule with Dr. Para March. Needing authorization for labs to be drawn here. Patient states he lives 3 minutes away and can be here whenever. Please advise.

## 2020-08-29 NOTE — Telephone Encounter (Signed)
Patient has an appt with you tomorrow which I know will now be virtual; are you okay with him coming in for labs this afternoon or tomorrow?

## 2020-08-30 ENCOUNTER — Other Ambulatory Visit (INDEPENDENT_AMBULATORY_CARE_PROVIDER_SITE_OTHER): Payer: BC Managed Care – PPO

## 2020-08-30 ENCOUNTER — Encounter: Payer: Self-pay | Admitting: Family Medicine

## 2020-08-30 ENCOUNTER — Other Ambulatory Visit: Payer: Self-pay

## 2020-08-30 ENCOUNTER — Telehealth (INDEPENDENT_AMBULATORY_CARE_PROVIDER_SITE_OTHER): Payer: BC Managed Care – PPO | Admitting: Family Medicine

## 2020-08-30 VITALS — Ht 65.0 in | Wt 185.0 lb

## 2020-08-30 DIAGNOSIS — E291 Testicular hypofunction: Secondary | ICD-10-CM

## 2020-08-30 DIAGNOSIS — R7989 Other specified abnormal findings of blood chemistry: Secondary | ICD-10-CM | POA: Diagnosis not present

## 2020-08-30 DIAGNOSIS — R519 Headache, unspecified: Secondary | ICD-10-CM

## 2020-08-30 MED ORDER — SUMATRIPTAN SUCCINATE 100 MG PO TABS: 100.0000 mg | ORAL_TABLET | ORAL | 1 refills | Status: DC | PRN

## 2020-08-30 MED ORDER — PROMETHAZINE HCL 25 MG PO TABS
12.5000 mg | ORAL_TABLET | Freq: Three times a day (TID) | ORAL | 1 refills | Status: DC | PRN
Start: 2020-08-30 — End: 2020-11-07

## 2020-08-30 NOTE — Progress Notes (Signed)
Been having terrible headaches on and off the last couple of months.  Had a debilitating migraine on 1/15 where I could not get out of bed and also had diarrhea and vomiting.  Migraine lasted all night and all day with no relief until the following day. Patient wanting to discuss getting medication if possible.

## 2020-08-30 NOTE — Progress Notes (Signed)
Virtual visit completed through WebEx or similar program Patient location: home  Provider location: [place] [facility], office  Participants: Patient and me (unless stated otherwise below)  Pandemic considerations d/w pt.   Limitations and rationale for visit method d/w patient.  Patient agreed to proceed.   CC: migraine.   HPI:  Follow-up testosterone level pending.  Discussed with patient.  We'll route this to urology as Lorain Childes.  Headache/sx started about 1 week ago.  At the base of his skull.  Then had pain radiate over the R>L temple, R side of the head.  Vomited from pain.  Lasted >24 hours.  Used cold compress, laid in the bed in dark room.  Sig pain from the HA.  Some smaller HAs in the meantime.    Vision change with the event but unrecalled if he had antecedent aura.  He eventually was able to sleep some and the pain got getter.    He still has some HA currently, frontal/R sided.  Not nearly as severe as the recent event.    No known fever, no stiff neck. No speech or motor changes.   11/2019- Normal MRI of the brain.  Per patient BP is still controlled.  BP 120/80 at last OV.    No known family history of migraine.  Meds and allergies reviewed.   ROS: Per HPI unless specifically indicated in ROS section   NAD Speech wnl  A/P:  Testosterone deficiency.  See notes on follow-up labs  Headache.  Concern is for migraine but given that this is new onset I would like him to see neurology.  He had relatively recent normal MRI of the brain which is reassuring.  We talked about the presumptive diagnosis of migraine, pathophysiology, treatment options.  We talked about prophylactic versus abortive medication.  Neuro referral ordered.  Reasonable to try Imitrex and Phenergan as needed with routine cautions given for both medications.  Discussed routine use.  He agrees with plan.  He has recently started testosterone but I do not know if this was a causative issue.  He will  update me as needed.

## 2020-08-31 LAB — TESTOSTERONE: Testosterone: 631 ng/dL (ref 264–916)

## 2020-09-01 DIAGNOSIS — R519 Headache, unspecified: Secondary | ICD-10-CM | POA: Insufficient documentation

## 2020-09-01 NOTE — Assessment & Plan Note (Signed)
Testosterone deficiency.  See notes on follow-up labs

## 2020-09-01 NOTE — Assessment & Plan Note (Signed)
Headache.  Concern is for migraine but given that this is new onset I would like him to see neurology.  He had relatively recent normal MRI of the brain which is reassuring.  We talked about the presumptive diagnosis of migraine, pathophysiology, treatment options.  We talked about prophylactic versus abortive medication.  Neuro referral ordered.  Reasonable to try Imitrex and Phenergan as needed with routine cautions given for both medications.  Discussed routine use.  He agrees with plan.  He has recently started testosterone but I do not know if this was a causative issue.  He will update me as needed.

## 2020-09-13 DIAGNOSIS — M533 Sacrococcygeal disorders, not elsewhere classified: Secondary | ICD-10-CM | POA: Diagnosis not present

## 2020-09-13 DIAGNOSIS — F419 Anxiety disorder, unspecified: Secondary | ICD-10-CM | POA: Diagnosis not present

## 2020-09-15 ENCOUNTER — Telehealth: Payer: Self-pay | Admitting: Urology

## 2020-09-15 NOTE — Telephone Encounter (Signed)
Testosterone level looks much better at 631.  Have his symptoms improved?

## 2020-09-15 NOTE — Telephone Encounter (Signed)
Please let us know about his symptoms at this point.  Thanks.

## 2020-09-16 ENCOUNTER — Telehealth: Payer: Self-pay | Admitting: Diagnostic Neuroimaging

## 2020-09-16 ENCOUNTER — Ambulatory Visit: Payer: Self-pay | Admitting: Diagnostic Neuroimaging

## 2020-09-16 NOTE — Telephone Encounter (Signed)
FYI: Pt's wife called, cancelled appt due to Pt has a headache can not drive.

## 2020-09-24 DIAGNOSIS — F410 Panic disorder [episodic paroxysmal anxiety] without agoraphobia: Secondary | ICD-10-CM | POA: Diagnosis not present

## 2020-09-24 DIAGNOSIS — F3132 Bipolar disorder, current episode depressed, moderate: Secondary | ICD-10-CM | POA: Diagnosis not present

## 2020-09-24 DIAGNOSIS — F609 Personality disorder, unspecified: Secondary | ICD-10-CM | POA: Diagnosis not present

## 2020-09-24 DIAGNOSIS — F1421 Cocaine dependence, in remission: Secondary | ICD-10-CM | POA: Diagnosis not present

## 2020-09-29 ENCOUNTER — Other Ambulatory Visit: Payer: Self-pay | Admitting: Urology

## 2020-09-30 ENCOUNTER — Other Ambulatory Visit: Payer: Self-pay | Admitting: Urology

## 2020-09-30 MED ORDER — TESTOSTERONE CYPIONATE 200 MG/ML IM SOLN: 200.0000 mg | INTRAMUSCULAR | 0 refills | Status: DC

## 2020-09-30 NOTE — Telephone Encounter (Signed)
Pts. Wife called asking for a refill on the pts. Testosterone to be sent in to his pharmacy.

## 2020-10-02 ENCOUNTER — Encounter: Payer: Self-pay | Admitting: Family Medicine

## 2020-10-06 NOTE — Telephone Encounter (Signed)
Is there any possible way to get his neurology appointment moved sooner?  See my chart message.

## 2020-10-09 ENCOUNTER — Other Ambulatory Visit: Payer: Self-pay | Admitting: Family Medicine

## 2020-10-09 MED ORDER — SUMATRIPTAN SUCCINATE 100 MG PO TABS: 100.0000 mg | ORAL_TABLET | ORAL | 1 refills | Status: DC | PRN

## 2020-10-17 ENCOUNTER — Other Ambulatory Visit: Payer: Self-pay | Admitting: Family Medicine

## 2020-10-17 NOTE — Telephone Encounter (Signed)
Pharmacy requests refill on: Sumatriptan 100 mg   LAST REFILL: 10/09/2020 (Q-20, R-1) LAST OV: 08/30/2020 NEXT OV: Not Scheduled  PHARMACY: [id number] [place], [place]

## 2020-10-30 DIAGNOSIS — Z5181 Encounter for therapeutic drug level monitoring: Secondary | ICD-10-CM | POA: Diagnosis not present

## 2020-10-30 DIAGNOSIS — M5136 Other intervertebral disc degeneration, lumbar region: Secondary | ICD-10-CM | POA: Diagnosis not present

## 2020-10-30 DIAGNOSIS — G8929 Other chronic pain: Secondary | ICD-10-CM | POA: Diagnosis not present

## 2020-10-30 DIAGNOSIS — M545 Low back pain, unspecified: Secondary | ICD-10-CM | POA: Diagnosis not present

## 2020-10-30 DIAGNOSIS — G894 Chronic pain syndrome: Secondary | ICD-10-CM | POA: Diagnosis not present

## 2020-10-30 DIAGNOSIS — Z79899 Other long term (current) drug therapy: Secondary | ICD-10-CM | POA: Diagnosis not present

## 2020-11-04 ENCOUNTER — Ambulatory Visit: Payer: Medicare Other | Admitting: Diagnostic Neuroimaging

## 2020-11-06 ENCOUNTER — Other Ambulatory Visit: Payer: Self-pay | Admitting: Family Medicine

## 2020-11-11 DIAGNOSIS — F5105 Insomnia due to other mental disorder: Secondary | ICD-10-CM | POA: Diagnosis not present

## 2020-11-11 DIAGNOSIS — F3131 Bipolar disorder, current episode depressed, mild: Secondary | ICD-10-CM | POA: Diagnosis not present

## 2020-11-11 DIAGNOSIS — F1421 Cocaine dependence, in remission: Secondary | ICD-10-CM | POA: Diagnosis not present

## 2020-11-11 DIAGNOSIS — F411 Generalized anxiety disorder: Secondary | ICD-10-CM | POA: Diagnosis not present

## 2020-11-11 NOTE — Progress Notes (Signed)
[facility]    Provider:  Dr Jaynee Eagles Requesting Provider: Tonia Ghent, MD Primary Care Provider:  Tonia Ghent, MD  CC:  headaches  HPI:  Cody [name] is a 50 y.o. male here as requested by Tonia Ghent, MD for headache. PMHx already evaluated by neurology for headache and severe sleep apnea Dr. Rowan Blase clinic 2021, MRI and MRV ordered, PTSD, hypothyroidism, diagnosed with nonepileptic spells by Dr. Angela Nevin, diagnosed with severe sleep apnea by Dr. Manuella Ghazi May 2021 a CPAP machine was ordered for him, chronic pain on a morphine pump, hyperlipidemia, depression, degenerative disc disease, chronic back pain, bipolar 2, anxiety, cigarette smoker, obesity, chronic pain, headache.  I reviewed Dr. Elveria Rising notes: He seen on August 30, 2020 complaining of headache for about a week, at the base of the skull, radiates over the right more than left temple, right side of the head, he vomited from pain in the prior 24 hours, use cold compress, later in bed in a dark room, he was able to sleep some and the pain got better, but the headache continued frontal right side, no known fever, stiff neck no speech or motor changes.  In April 2021 he had a normal MRI of the brain.  Per Dr. Elveria Rising evaluation, concerning for migraine, he was prescribed Imitrex and Phenergan as needed and referred to neurology.  Patient has been seen by neurology in the past, in January 2021 he was seen at the [facility] clinic by Dr. Manuella Ghazi, for episodic confusion, more so in the morning with associated disequilibrium, history of bipolar disorder and substance abuse, chronic pain on morphine pump with some episodes of seizure-like activity likely nonepileptic spells.  Dr. Manuella Ghazi recommended EEG, home sleep study, neurocognitive testing, head CT October 2020 was unremarkable.  They ordered home sleep test, speech therapy for cognitive training, neuropsychology for neurocognitive testing, they checked B12, B1,  folate, RPR, Lyme, ACE, ESR, CRP, ANA, IFE, rheumatoid arthritis factor.  Patient is here alone and reports: The first headache was in January of this year, "out of the blue", 2-3 times a week, 4-7/10 in pain, Imitrex helps and sp does promethazine. He has nausea and vomiting, worst headache of life so severe he was terrified, they are on the right side and on the top, pulsating and pounding and throbbing, excruciating, he may see sparkles in the eyes he also gets pain in the back of the neck and pressure, he takes imitrext at that point. The headaches can last 24-48 hours, with treatment under a day, photophobia/phonophobia, since January 1/2 the month of migraines, he is worried, he can't drive with the migraines, no family history, acute onset, no medication overuse, does not always have an aura. He is trying to examine his lifestyle, stress, sleep and nothing has been discovered, they come after 12, black out helps, right eye water, not stabbing, he has pressure behind his eye.  He has numbness in the right side of the scalp and face with the headaches, but no focal weakness. No other focal neurologic deficits, associated symptoms, inciting events or modifiable factors.  Reviewed notes, labs and imaging from outside physicians, which showed:  CMP with elevated glucose 06/13/2020 otherwise unremarkable  Celexa, lexapro, lithium, prednisone, propranolol, seroquel, imitrex, lamictal, amitriptyline, topamax with lamictal contraindicated  MRI brain 11/23/2019: FINDINGS: Brain: There is no acute infarction or intracranial hemorrhage. There is no intracranial mass, mass effect, or edema. There is no hydrocephalus or extra-axial fluid collection. Ventricles and sulci are  normal in size and configuration. No abnormal enhancement.  Vascular: Major vessel flow voids at the skull base are preserved.  Skull and upper cervical spine: Normal marrow signal is preserved.  Sinuses/Orbits: Paranasal sinuses  are aerated. Orbits are unremarkable.  Other: Sella is unremarkable.  Mastoid air cells are clear.  IMPRESSION: Normal MRI of the brain.  Review of Systems: Patient complains of symptoms per HPI as well as the following symptoms: headache. Pertinent negatives and positives per HPI. All others negative.   Social History   Socioeconomic History  . Marital status: Married    Spouse name: Not on file  . Number of children: 0  . Years of education: Not on file  . Highest education level: Not on file  Occupational History  . Occupation: Disabled  Tobacco Use  . Smoking status: Former Smoker    Packs/day: 0.25    Types: E-cigarettes    Quit date: 08/10/2009    Years since quitting: 11.2  . Smokeless tobacco: Never Used  . Tobacco comment: smoked age intermittently age [age]; up to 2 cigarettes/ day   Vaping Use  . Vaping Use: Never used  Substance and Sexual Activity  . Alcohol use: No  . Drug use: No  . Sexual activity: Not on file  Other Topics Concern  . Not on file  Social History Narrative   From [place].     [address] Grad   To [place] 2000   Disability from bipolar affective disorder, prev accounting.     Married 1997   No kids   Enjoys walking, travel.        Caffeine:    Social Determinants of Radio broadcast assistant Strain: Not on file  Food Insecurity: Not on file  Transportation Needs: Not on file  Physical Activity: Not on file  Stress: Not on file  Social Connections: Not on file  Intimate Partner Violence: Not on file    Family History  Problem Relation Age of Onset  . Stroke Father 52  . Thyroid disease Father        hypo  . Ulcers Mother   . Depression Mother   . Thyroid disease Mother        hypo  . Lung cancer Maternal Grandmother   . Thyroid disease Maternal Grandmother        hypo  . Heart attack Maternal Grandfather 55  . Esophageal cancer Maternal Grandfather   . Colon cancer Neg Hx   . Diabetes Neg Hx   . Prostate  cancer Neg Hx   . Kidney cancer Neg Hx   . Bladder Cancer Neg Hx   . Migraines Neg Hx   . Headache Neg Hx     Past Medical History:  Diagnosis Date  . Anxiety   . Bipolar 2 disorder ([place]) 03/2009   Dr Cephus Shelling  . Chronic back pain   . Chronic pain syndrome    after he had a hematoma from back surgery, "pressure on spine"  . 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 Degenerative disk disease   . Depression   . Fracture, mandible ([place])   . Fracture, tibia   . Hyperlipidemia   . Hypothyroidism 05/2007   Dr Unice Cobble (retired)/ Dr Elsie Stain  . PTSD (post-traumatic stress disorder)    suicide attempt X1, gestureX 1    Patient Active Problem List   Diagnosis Date Noted  . Chronic  migraine with aura 11/12/2020  . Chronic migraine without aura without status migrainosus, not intractable 11/12/2020  . Nonintractable headache 09/01/2020  . Diarrhea 06/16/2020  . Medicare annual wellness visit, initial 03/04/2020  . Fatty liver 07/05/2019  . Vasculitis ([place]) 05/16/2019  . Obesity with body mass index greater than 30 04/27/2018  . Knee pain 08/17/2017  . Advance care planning 09/15/2015  . LLQ pain 09/15/2015  . Low serum testosterone level 04/04/2011  . Vitamin D deficiency 05/01/2010  . CIGARETTE SMOKER 05/01/2010  . Shortness of breath 04/29/2009  . HLD (hyperlipidemia) 04/01/2009  . Anxiety state 04/01/2009  . Bipolar affective ([place]) 04/01/2009  . FASTING HYPERGLYCEMIA 04/01/2009  . Hypothyroidism 04/07/2007  . SYNDROME, CHRONIC PAIN 04/07/2007    Past Surgical History:  Procedure Laterality Date  . APPENDECTOMY     peritonitis  . INTRATHECAL PUMP IMPLANTATION  2007  . LAMINECTOMY  2004   arterial injury, transfused 7 units pc, implant surgery   . MANDIBLE FRACTURE SURGERY     mugged  . THORACIC DISCECTOMY     T10  . UPPER GASTROINTESTINAL ENDOSCOPY  2006   gastritis    Current Outpatient Medications   Medication Sig Dispense Refill  . AMBULATORY NON FORMULARY MEDICATION Morphine Pump Implant    . atorvastatin (LIPITOR) 10 MG tablet TAKE 1 TABLET BY MOUTH  DAILY 90 tablet 3  . busPIRone (BUSPAR) 10 MG tablet Take 1 tablet (10 mg total) by mouth daily. (Patient taking differently: Take 10 mg by mouth 2 (two) times daily.)    . Cholecalciferol (VITAMIN D) 50 MCG (2000 UT) CAPS Take 1 capsule by mouth daily.    Marland Kitchen escitalopram (LEXAPRO) 20 MG tablet Take 0.5 tablets (10 mg total) by mouth daily. (Patient taking differently: Take 20 mg by mouth daily.)    . Fremanezumab-vfrm (AJOVY) 225 MG/1.5ML SOAJ Inject 225 mg into the skin every 30 (thirty) days. 1.5 mL 11  . HYDROmorphone (DILAUDID) 2 MG tablet Take 2 mg by mouth 4 (four) times daily as needed.    . lamoTRIgine (LAMICTAL) 200 MG tablet Take 1 tablet (200 mg total) by mouth 2 (two) times daily.    Marland Kitchen levothyroxine (SYNTHROID) 75 MCG tablet TAKE 1 TABLET BY MOUTH  DAILY BEFORE BREAKFAST . 90 tablet 3  . Omega-3 1000 MG CAPS Take 2 each by mouth daily.    . promethazine (PHENERGAN) 25 MG tablet TAKE 0.5-1 TABLETS BY MOUTH EVERY 8 (EIGHT) HOURS AS NEEDED FOR NAUSEA OR VOMITING. 20 tablet 1  . SUMAtriptan (IMITREX) 100 MG tablet Take 1 tablet (100 mg total) by mouth every 2 (two) hours as needed for migraine (max 2 doses in 24 hours.). May repeat in 2 hours if headache persists or recurs. 20 tablet 1  . testosterone cypionate (DEPOTESTOSTERONE CYPIONATE) 200 MG/ML injection Inject 1 mL (200 mg total) into the muscle every 14 (fourteen) days. 6 mL 0  . ziprasidone (GEODON) 20 MG capsule Take 40 mg by mouth daily.    . hydrOXYzine (ATARAX/VISTARIL) 50 MG tablet Take 50 mg by mouth 2 (two) times daily.    Marland Kitchen OVER THE COUNTER MEDICATION      Current Facility-Administered Medications  Medication Dose Route Frequency Provider Last Rate Last Admin  . Fremanezumab-vfrm SOSY 225 mg  225 mg Subcutaneous Once Melvenia Beam, MD        Allergies as of  11/12/2020 - Review Complete 11/12/2020  Allergen Reaction Noted  . Neurontin [gabapentin]  03/19/2011  . Vancomycin    .  Lithium  03/07/2018  . Mirtazapine      Vitals: BP 139/88 (BP Location: Right Arm, Patient Position: Sitting)   Pulse 89   Ht $R'5\' 5"'qq$  (1.651 m)   Wt 203 lb (92.1 kg)   BMI 33.78 kg/m  Last Weight:  Wt Readings from Last 1 Encounters:  11/12/20 203 lb (92.1 kg)   Last Height:   Ht Readings from Last 1 Encounters:  11/12/20 $RemoveB'5\' 5"'ivacCjUH$  (1.651 m)     Physical exam: Exam: Gen: NAD, conversant, well nourised, obese, well groomed                     CV: RRR, no MRG. No Carotid Bruits. No peripheral edema, warm, nontender Eyes: Conjunctivae clear without exudates or hemorrhage  Neuro: Detailed Neurologic Exam  Speech:    Speech is normal; fluent and spontaneous with normal comprehension.  Cognition:    The patient is oriented to person, place, and time;     recent and remote memory intact;     language fluent;     normal attention, concentration,     fund of knowledge Cranial Nerves:    The pupils are equal, round, and reactive to light. The fundi are normal and spontaneous venous pulsations are present. Visual fields are full to finger confrontation. Extraocular movements are intact. Trigeminal sensation is intact and the muscles of mastication are normal. The face is symmetric. The palate elevates in the midline. Hearing intact. Voice is normal. Shoulder shrug is normal. The tongue has normal motion without fasciculations.   Coordination:    No dysmetria or ataxia   Gait:    Heel-toe and tandem gait are normal.   Motor Observation:    No asymmetry, no atrophy, and no involuntary movements noted. Tone:    Normal muscle tone.    Posture:    Posture is normal. normal erect    Strength:    Strength is V/V in the upper and lower limbs.      Sensation: intact to LT     Reflex Exam:  DTR's:    Deep tendon reflexes in the upper and lower extremities  are normal bilaterally.   Toes:    The toes are downgoing bilaterally.   Clonus:    Clonus is absent.    Assessment/Plan:  50 year old with chronic migraines. Considering they started acutely in January (never had one before) and now he has 15 migraine days a month, vision changes, needs imaging   AHI 26, but patient denies he has sleep apnea, I had a long conversation about treating this and the sequelae.   MRI of the brain : MRI brain due to concerning symptoms of positional headaches,vision changes  to look for space occupying mass, chiari or intracranial hypertension (pseudotumor).  Start Ajovy (we may need to switch to [facility]) for prevention Continue Imitrex for acute management  Discussed: To prevent or relieve headaches, try the following: Cool Compress. Lie down and place a cool compress on your head.  Avoid headache triggers. If certain foods or odors seem to have triggered your migraines in the past, avoid them. A headache diary might help you identify triggers.  Include physical activity in your daily routine. Try a daily walk or other moderate aerobic exercise.  Manage stress. Find healthy ways to cope with the stressors, such as delegating tasks on your to-do list.  Practice relaxation techniques. Try deep breathing, yoga, massage and visualization.  Eat regularly. Eating regularly scheduled meals and maintaining  a healthy diet might help prevent headaches. Also, drink plenty of fluids.  Follow a regular sleep schedule. Sleep deprivation might contribute to headaches Consider biofeedback. With this mind-body technique, you learn to control certain bodily functions -- such as muscle tension, heart rate and blood pressure -- to prevent headaches or reduce headache pain.    Proceed to emergency room if you experience new or worsening symptoms or symptoms do not resolve, if you have new neurologic symptoms or if headache is severe, or for any concerning symptom.   Provided  education and documentation from American headache Society toolbox including articles on: chronic migraine medication overuse headache, chronic migraines, prevention of migraines, behavioral and other nonpharmacologic treatments for headache.   Orders Placed This Encounter  Procedures  . MR BRAIN W WO CONTRAST   Meds ordered this encounter  Medications  . Fremanezumab-vfrm SOSY 225 mg  . Fremanezumab-vfrm (AJOVY) 225 MG/1.5ML SOAJ    Sig: Inject 225 mg into the skin every 30 (thirty) days.    Dispense:  1.5 mL    Refill:  11    Patient has copay card; she can have medication regardless of insurance approval or copay amount.    Cc: Tonia Ghent, MD,  Tonia Ghent, MD  Sarina Ill, MD  [facility] [address] [place], [place] [address]  Phone [phone] Fax [phone]

## 2020-11-12 ENCOUNTER — Encounter: Payer: Self-pay | Admitting: Neurology

## 2020-11-12 ENCOUNTER — Ambulatory Visit (INDEPENDENT_AMBULATORY_CARE_PROVIDER_SITE_OTHER): Payer: BC Managed Care – PPO | Admitting: Neurology

## 2020-11-12 VITALS — BP 139/88 | HR 89 | Ht 65.0 in | Wt 203.0 lb

## 2020-11-12 DIAGNOSIS — G43709 Chronic migraine without aura, not intractable, without status migrainosus: Secondary | ICD-10-CM | POA: Insufficient documentation

## 2020-11-12 DIAGNOSIS — R51 Headache with orthostatic component, not elsewhere classified: Secondary | ICD-10-CM | POA: Diagnosis not present

## 2020-11-12 DIAGNOSIS — R519 Headache, unspecified: Secondary | ICD-10-CM

## 2020-11-12 DIAGNOSIS — H539 Unspecified visual disturbance: Secondary | ICD-10-CM | POA: Diagnosis not present

## 2020-11-12 DIAGNOSIS — G43109 Migraine with aura, not intractable, without status migrainosus: Secondary | ICD-10-CM | POA: Insufficient documentation

## 2020-11-12 MED ORDER — AJOVY 225 MG/1.5ML ~~LOC~~ SOAJ: 225.0000 mg | SUBCUTANEOUS | 11 refills | Status: DC

## 2020-11-12 MED ORDER — FREMANEZUMAB-VFRM 225 MG/1.5ML ~~LOC~~ SOSY: 225.0000 mg | PREFILLED_SYRINGE | Freq: Once | SUBCUTANEOUS | Status: DC

## 2020-11-12 NOTE — Patient Instructions (Addendum)
Start Ajovy (we may need to switch to [facility]) for prevention Continue Imitrex for acute management MRI brain  Fremanezumab injection What is this medicine? FREMANEZUMAB (fre ma NEZ ue mab) is used to prevent migraine headaches. This medicine may be used for other purposes; ask your health care provider or pharmacist if you have questions. COMMON BRAND NAME(S): AJOVY What should I tell my health care provider before I take this medicine? They need to know if you have any of these conditions:  an unusual or allergic reaction to fremanezumab, other medicines, foods, dyes, or preservatives  pregnant or trying to get pregnant  breast-feeding How should I use this medicine? This medicine is for injection under the skin. You will be taught how to prepare and give this medicine. Use exactly as directed. Take your medicine at regular intervals. Do not take your medicine more often than directed. It is important that you put your used needles and syringes in a special sharps container. Do not put them in a trash can. If you do not have a sharps container, call your pharmacist or healthcare provider to get one. Talk to your pediatrician regarding the use of this medicine in children. Special care may be needed. Overdosage: If you think you have taken too much of this medicine contact a poison control center or emergency room at once. NOTE: This medicine is only for you. Do not share this medicine with others. What if I miss a dose? If you miss a dose, take it as soon as you can. If it is almost time for your next dose, take only that dose. Do not take double or extra doses. What may interact with this medicine? Interactions are not expected. This list may not describe all possible interactions. Give your health care provider a list of all the medicines, herbs, non-prescription drugs, or dietary supplements you use. Also tell them if you smoke, drink alcohol, or use illegal drugs. Some items may  interact with your medicine. What should I watch for while using this medicine? Tell your doctor or healthcare professional if your symptoms do not start to get better or if they get worse. What side effects may I notice from receiving this medicine? Side effects that you should report to your doctor or health care professional as soon as possible:  allergic reactions like skin rash, itching or hives, swelling of the face, lips, or tongue Side effects that usually do not require medical attention (report these to your doctor or health care professional if they continue or are bothersome):  pain, redness, or irritation at site where injected This list may not describe all possible side effects. Call your doctor for medical advice about side effects. You may report side effects to FDA at 1-800-FDA-1088. Where should I keep my medicine? Keep out of the reach of children. You will be instructed on how to store this medicine. Throw away any unused medicine after the expiration date on the label. NOTE: This sheet is a summary. It may not cover all possible information. If you have questions about this medicine, talk to your doctor, pharmacist, or health care provider.  2021 Elsevier/Gold Standard (2017-04-26 17:22:56)

## 2020-11-14 ENCOUNTER — Telehealth: Payer: Self-pay | Admitting: Neurology

## 2020-11-14 NOTE — Telephone Encounter (Signed)
Medicare/BCBS Berkley Harvey: NPR spoke to [facility] Ref # [id number].  Patient is scheduled at [facility] for 11/26/20 to arrive at 4:30 pm. Patient is aware of time & day. I also gave him their number of [phone] incase he needed to r/s.

## 2020-11-18 ENCOUNTER — Ambulatory Visit: Payer: Medicare Other | Admitting: Diagnostic Neuroimaging

## 2020-11-19 DIAGNOSIS — M5459 Other low back pain: Secondary | ICD-10-CM | POA: Diagnosis not present

## 2020-11-19 DIAGNOSIS — M6281 Muscle weakness (generalized): Secondary | ICD-10-CM | POA: Diagnosis not present

## 2020-11-25 DIAGNOSIS — M6281 Muscle weakness (generalized): Secondary | ICD-10-CM | POA: Diagnosis not present

## 2020-11-25 DIAGNOSIS — M5459 Other low back pain: Secondary | ICD-10-CM | POA: Diagnosis not present

## 2020-11-26 ENCOUNTER — Ambulatory Visit: Payer: Medicare Other

## 2020-11-26 ENCOUNTER — Other Ambulatory Visit: Payer: Self-pay | Admitting: *Deleted

## 2020-11-26 DIAGNOSIS — E291 Testicular hypofunction: Secondary | ICD-10-CM

## 2020-12-05 ENCOUNTER — Other Ambulatory Visit: Payer: Self-pay

## 2020-12-05 ENCOUNTER — Ambulatory Visit
Admission: RE | Admit: 2020-12-05 | Discharge: 2020-12-05 | Disposition: A | Discharge status: Home / Self Care | Payer: BC Managed Care – PPO | Source: Ambulatory Visit | Attending: Neurology | Admitting: Neurology

## 2020-12-05 DIAGNOSIS — R519 Headache, unspecified: Secondary | ICD-10-CM

## 2020-12-05 DIAGNOSIS — R51 Headache with orthostatic component, not elsewhere classified: Secondary | ICD-10-CM | POA: Diagnosis not present

## 2020-12-05 DIAGNOSIS — H539 Unspecified visual disturbance: Secondary | ICD-10-CM

## 2020-12-05 MED ORDER — GADOBUTROL 1 MMOL/ML IV SOLN
9.0000 mL | Freq: Once | INTRAVENOUS | Status: AC | PRN
  Administered 2020-12-05: 9 mL via INTRAVENOUS

## 2020-12-06 ENCOUNTER — Other Ambulatory Visit: Payer: Self-pay

## 2020-12-06 ENCOUNTER — Other Ambulatory Visit: Payer: Self-pay | Admitting: Family Medicine

## 2020-12-10 ENCOUNTER — Other Ambulatory Visit: Payer: Self-pay | Admitting: *Deleted

## 2020-12-10 ENCOUNTER — Telehealth: Payer: Self-pay | Admitting: Family Medicine

## 2020-12-10 DIAGNOSIS — E291 Testicular hypofunction: Secondary | ICD-10-CM

## 2020-12-10 DIAGNOSIS — R7989 Other specified abnormal findings of blood chemistry: Secondary | ICD-10-CM

## 2020-12-10 NOTE — Telephone Encounter (Signed)
Pt called in wanted to know if he can get a lab done for his testerone and have the blood work sent to [facility] and the results sent to his urologist Dr. Vanna Scotland shes at [facility] urology and wanted to do on friday

## 2020-12-11 NOTE — Telephone Encounter (Signed)
Okay with me.  I put in the orders.  Thanks.

## 2020-12-11 NOTE — Telephone Encounter (Signed)
Patient notified lab orders are done and appt was made for 12/13/20 at 8:50 am.

## 2020-12-12 ENCOUNTER — Telehealth: Payer: Self-pay | Admitting: *Deleted

## 2020-12-12 NOTE — Telephone Encounter (Signed)
We received a denial letter for Ajovy stating they are unable to reach the prescriber for more information. PA # [id number] I do not see where a PA was ever started so I have initiated a new one on cover my meds. Key: [id number].  Awaiting determination from [facility].   It does appear that insurance prefers Aimovig and Emgality so I am not sure if this will get approved.  If an appeal is needed, fax to [employer] coordinator at [phone].

## 2020-12-13 ENCOUNTER — Other Ambulatory Visit: Payer: Medicare Other

## 2020-12-16 ENCOUNTER — Other Ambulatory Visit (INDEPENDENT_AMBULATORY_CARE_PROVIDER_SITE_OTHER): Payer: BC Managed Care – PPO

## 2020-12-16 ENCOUNTER — Other Ambulatory Visit: Payer: Self-pay

## 2020-12-16 DIAGNOSIS — R7989 Other specified abnormal findings of blood chemistry: Secondary | ICD-10-CM | POA: Diagnosis not present

## 2020-12-16 NOTE — Telephone Encounter (Addendum)
Per Cover My Meds, Ajovy PA denied because of requirement to try Emgality and Aimovig first (or cannot take them). Case ID: [id number].   Patient should try to use Ajovy savings card despite denial.

## 2020-12-17 LAB — TESTOSTERONE: Testosterone: 869 ng/dL (ref 264–916)

## 2020-12-17 LAB — HEMATOCRIT: Hematocrit: 48.5 % (ref 37.5–51.0)

## 2020-12-19 ENCOUNTER — Other Ambulatory Visit: Payer: Self-pay | Admitting: Urology

## 2020-12-23 ENCOUNTER — Telehealth: Payer: Self-pay

## 2020-12-23 NOTE — Telephone Encounter (Signed)
Patient notified of Shannon's message and scheduled for appointment to discuss treatment

## 2020-12-23 NOTE — Progress Notes (Signed)
12/24/2020 3:41 PM   Cody [name] [birth year] [id number]  Referring provider: Joaquim Nam, MD [address] [place],  [place] [address]  Chief Complaint  Patient presents with  . Hypogonadism   Urological history: 1. Testosterone deficiency -testosterone level 869 in 12/2020 -HCT 48.5 in 12/2020 -managed with testosterone cypionate 200 mg/cc, 1 cc every 14 days   HPI: Cody [name] is a 50 y.o. male who presents today to discuss testosterone therapy.   He is very satisfied with his testosterone therapy at this point.  He states he is experiencing good energy, good overall feeling and is back to being sexually active.  Patient denies any modifying or aggravating factors.  Patient denies any gross hematuria, dysuria or suprapubic/flank pain.  Patient denies any fevers, chills, nausea or vomiting.    IPSS    Row Name 12/24/20 1500         International Prostate Symptom Score   How often have you had the sensation of not emptying your bladder? Not at All     How often have you had to urinate less than every two hours? Not at All     How often have you found you stopped and started again several times when you urinated? Not at All     How often have you found it difficult to postpone urination? Not at All     How often have you had a weak urinary stream? Not at All     How often have you had to strain to start urination? Not at All     How many times did you typically get up at night to urinate? 1 Time     Total IPSS Score 1           Quality of Life due to urinary symptoms   If you were to spend the rest of your life with your urinary condition just the way it is now how would you feel about that? Mixed            Score:  1-7 Mild 8-19 Moderate 20-35 Severe  Patient still having spontaneous erections.   He denies any pain or curvature with erections.    SHIM    Row Name 12/24/20 1508         SHIM: Over the last 6 months:   How do you rate your  confidence that you could get and keep an erection? Very High     When you had erections with sexual stimulation, how often were your erections hard enough for penetration (entering your partner)? Almost Always or Always     During sexual intercourse, how often were you able to maintain your erection after you had penetrated (entered) your partner? Almost Always or Always     During sexual intercourse, how difficult was it to maintain your erection to completion of intercourse? Not Difficult     When you attempted sexual intercourse, how often was it satisfactory for you? Almost Always or Always           SHIM Total Score   SHIM 25            Score: 1-7 Severe ED 8-11 Moderate ED 12-16 Mild-Moderate ED 17-21 Mild ED 22-25 No ED  PMH: Past Medical History:  Diagnosis Date  . Anxiety   . Bipolar 2 disorder (HCC) 03/2009   Dr Caryn Section  . Chronic back pain   . Chronic pain syndrome  after he had a hematoma from back surgery, "pressure on spine"  . 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 Degenerative disk disease   . Depression   . Fracture, mandible (HCC)   . Fracture, tibia   . Hyperlipidemia   . Hypothyroidism 05/2007   Dr Marga MelnickWilliam Hopper (retired)/ Dr Crawford GivensGraham Duncan  . PTSD (post-traumatic stress disorder)    suicide attempt X1, gestureX 1    Surgical History: Past Surgical History:  Procedure Laterality Date  . APPENDECTOMY     peritonitis  . INTRATHECAL PUMP IMPLANTATION  2007  . LAMINECTOMY  2004   arterial injury, transfused 7 units pc, implant surgery   . MANDIBLE FRACTURE SURGERY     mugged  . THORACIC DISCECTOMY     T10  . UPPER GASTROINTESTINAL ENDOSCOPY  2006   gastritis    Home Medications:  Allergies as of 12/24/2020      Reactions   Neurontin [gabapentin]    lethargic   Vancomycin    Swelling    Lithium    tremor   Mirtazapine    REACTION: lethargy ( Remeron)      Medication List        Accurate as of Dec 24, 2020  3:41 PM. If you have any questions, ask your nurse or doctor.        Ajovy 225 MG/1.5ML Soaj Generic drug: Fremanezumab-vfrm Inject 225 mg into the skin every 30 (thirty) days.   AMBULATORY NON FORMULARY MEDICATION Morphine Pump Implant   atorvastatin 10 MG tablet Commonly known as: LIPITOR TAKE 1 TABLET BY MOUTH  DAILY   busPIRone 10 MG tablet Commonly known as: BUSPAR Take 1 tablet (10 mg total) by mouth daily. What changed: when to take this   escitalopram 20 MG tablet Commonly known as: Lexapro Take 0.5 tablets (10 mg total) by mouth daily. What changed: how much to take   HYDROmorphone 2 MG tablet Commonly known as: DILAUDID Take 2 mg by mouth 4 (four) times daily as needed.   hydrOXYzine 50 MG tablet Commonly known as: ATARAX/VISTARIL Take 50 mg by mouth 2 (two) times daily.   lamoTRIgine 200 MG tablet Commonly known as: LAMICTAL Take 1 tablet (200 mg total) by mouth 2 (two) times daily.   levothyroxine 75 MCG tablet Commonly known as: SYNTHROID TAKE 1 TABLET BY MOUTH  DAILY BEFORE BREAKFAST .   Omega-3 1000 MG Caps Take 2 each by mouth daily.   OVER THE COUNTER MEDICATION   promethazine 25 MG tablet Commonly known as: PHENERGAN TAKE 0.5-1 TABLETS BY MOUTH EVERY 8 (EIGHT) HOURS AS NEEDED FOR NAUSEA OR VOMITING.   SUMAtriptan 100 MG tablet Commonly known as: IMITREX TAKE 1 TABLET (100 MG TOTAL) BY MOUTH EVERY 2 (TWO) HOURS AS NEEDED FOR MIGRAINE (MAX 2 DOSES IN 24 HOURS.). MAY REPEAT IN 2 HOURS IF HEADACHE PERSISTS OR RECURS.   testosterone cypionate 200 MG/ML injection Commonly known as: DEPOTESTOSTERONE CYPIONATE Inject 1 mL (200 mg total) into the muscle every 14 (fourteen) days.   Vitamin D 50 MCG (2000 UT) Caps Take 1 capsule by mouth daily.   ziprasidone 20 MG capsule Commonly known as: GEODON Take 40 mg by mouth daily.       Allergies:  Allergies  Allergen Reactions  . Neurontin [Gabapentin]     lethargic   . Vancomycin     Swelling   . Lithium     tremor  . Mirtazapine  REACTION: lethargy ( Remeron)    Family History: Family History  Problem Relation Age of Onset  . Stroke Father 22  . Thyroid disease Father        hypo  . Ulcers Mother   . Depression Mother   . Thyroid disease Mother        hypo  . Lung cancer Maternal Grandmother   . Thyroid disease Maternal Grandmother        hypo  . Heart attack Maternal Grandfather 55  . Esophageal cancer Maternal Grandfather   . Colon cancer Neg Hx   . Diabetes Neg Hx   . Prostate cancer Neg Hx   . Kidney cancer Neg Hx   . Bladder Cancer Neg Hx   . Migraines Neg Hx   . Headache Neg Hx     Social History:  reports that he quit smoking about 11 years ago. His smoking use included e-cigarettes. He smoked 0.25 packs per day. He has never used smokeless tobacco. He reports that he does not drink alcohol and does not use drugs.  ROS: Pertinent ROS in HPI  Physical Exam: BP 131/82   Pulse 91   Ht 5\' 5"  (1.651 m)   Wt 200 lb (90.7 kg)   BMI 33.28 kg/m   Constitutional:  Well nourished. Alert and oriented, No acute distress. HEENT: [place] AT, mask in place.  Trachea midline Cardiovascular: No clubbing, cyanosis, or edema. Respiratory: Normal respiratory effort, no increased work of breathing. GU: No CVA tenderness.  No bladder fullness or masses.  Patient with circumcised phallus.  Urethral meatus is patent.  No penile discharge. No penile lesions or rashes. Scrotum without lesions, cysts, rashes and/or edema.  Testicles are located scrotally bilaterally. No masses are appreciated in the testicles. Left and right epididymis are normal. Rectal: Patient with  normal sphincter tone. Anus and perineum without scarring or rashes. No rectal masses are appreciated. Prostate is approximately 45 grams, could only palpate the apex and midportion of the glans, no nodules are appreciated. Seminal vesicles could not be palpated  Neurologic: Grossly  intact, no focal deficits, moving all 4 extremities. Psychiatric: Normal mood and affect.  Laboratory Data: Lab Results  Component Value Date   WBC 8.8 06/13/2020   HGB 15.1 06/13/2020   HCT 48.5 12/16/2020   MCV 93 06/13/2020   PLT 278 06/13/2020    Lab Results  Component Value Date   CREATININE 0.89 06/13/2020    Lab Results  Component Value Date   TESTOSTERONE 869 12/16/2020    Lab Results  Component Value Date   HGBA1C 5.6 02/19/2020    Lab Results  Component Value Date   TSH 1.530 02/19/2020       Component Value Date/Time   CHOL 166 02/19/2020 1306   CHOL 158 12/09/2016 0000   HDL 29 (L) 02/19/2020 1306   HDL 31 12/09/2016 0000   CHOLHDL 5.7 (H) 02/19/2020 1306   CHOLHDL 7 10/11/2012 1454   VLDL 26.4 10/11/2012 1454   LDLCALC 88 02/19/2020 1306   LDLCALC 96 12/09/2016 0000    Lab Results  Component Value Date   AST 26 06/13/2020   Lab Results  Component Value Date   ALT 30 06/13/2020    Urinalysis    Component Value Date/Time   COLORURINE STRAW (A) 05/16/2019 1551   APPEARANCEUR Clear 05/07/2020 1341   LABSPEC 1.010 05/16/2019 1551   LABSPEC 1.009 03/23/2014 1924   PHURINE 7.0 05/16/2019 1551   GLUCOSEU Negative 05/07/2020 1341  GLUCOSEU Negative 03/23/2014 1924   HGBUR NEGATIVE 05/16/2019 1551   BILIRUBINUR Negative 05/07/2020 1341   BILIRUBINUR Negative 03/23/2014 1924   KETONESUR NEGATIVE 05/16/2019 1551   PROTEINUR Trace (A) 05/07/2020 1341   PROTEINUR NEGATIVE 05/16/2019 1551   UROBILINOGEN 0.2 11/21/2013 1549   NITRITE Negative 05/07/2020 1341   NITRITE NEGATIVE 05/16/2019 1551   LEUKOCYTESUR Negative 05/07/2020 1341   LEUKOCYTESUR NEGATIVE 05/16/2019 1551   LEUKOCYTESUR Negative 03/23/2014 1924  I have reviewed the labs.   Pertinent Imaging: No recent imaging  Assessment & Plan:    1. Testosterone deficiency -Continue testosterone cypionate 1 cc every 14 days  Return in about 6 months (around 06/26/2021) for PSA,  testosterone (one week after injection) H & H, IPSS, SHIM and exam.  These notes generated with voice recognition software. I apologize for typographical errors.  Michiel Cowboy, PA-C  [facility] [address]  [address] [place], [place] [address] [phone]

## 2020-12-23 NOTE — Telephone Encounter (Signed)
-----   Message from Harle Battiest, PA-C sent at 12/23/2020  8:12 AM EDT ----- His pharmacy had asked for a refill for his testosterone cypionate and I declined the refill.  He needs an appointment at this time, so we can regroup.   ----- Message ----- From: Vanna Scotland, MD Sent: 12/19/2020   3:54 PM EDT To: Riki Altes, MD, Harle Battiest, PA-C, #  The labs look fine to me.  I will forward this to Dr. Lonna Cobb / Burman Nieves and as I have referred them this patient based on the complexity of his history and their expertise.  It is a little bit unclear to me about what is exactly going on with this patient.  Looks like he canceled labs in our clinic several times and is having you draw them for unclear reasons.  Also, there is no documentation about whether or not his symptoms are improved.  We generally see these folks back at least every 6 months until they are stabilized.    I will send a message to [facility] and Dr. Lonna Cobb to help arrange follow-up that they feel is appropriate.  Morrie Sheldon      ----- Message ----- From: Joaquim Nam, MD Sent: 12/18/2020   2:03 PM EDT To: Vanna Scotland, MD  I need your input about his labs.  Thank you.

## 2020-12-23 NOTE — Telephone Encounter (Signed)
Pt called office asking questions about Testosterone injection and upcoming appt w/Shannon this Thursday.  Can you give pt a call please?

## 2020-12-24 ENCOUNTER — Other Ambulatory Visit: Payer: Self-pay

## 2020-12-24 ENCOUNTER — Ambulatory Visit (INDEPENDENT_AMBULATORY_CARE_PROVIDER_SITE_OTHER): Payer: BC Managed Care – PPO | Admitting: Urology

## 2020-12-24 ENCOUNTER — Encounter: Payer: Self-pay | Admitting: Urology

## 2020-12-24 VITALS — BP 131/82 | HR 91 | Ht 65.0 in | Wt 200.0 lb

## 2020-12-24 DIAGNOSIS — N400 Enlarged prostate without lower urinary tract symptoms: Secondary | ICD-10-CM

## 2020-12-24 DIAGNOSIS — F609 Personality disorder, unspecified: Secondary | ICD-10-CM | POA: Diagnosis not present

## 2020-12-24 DIAGNOSIS — F410 Panic disorder [episodic paroxysmal anxiety] without agoraphobia: Secondary | ICD-10-CM | POA: Diagnosis not present

## 2020-12-24 DIAGNOSIS — E349 Endocrine disorder, unspecified: Secondary | ICD-10-CM | POA: Diagnosis not present

## 2020-12-24 DIAGNOSIS — F1421 Cocaine dependence, in remission: Secondary | ICD-10-CM | POA: Diagnosis not present

## 2020-12-24 DIAGNOSIS — F3132 Bipolar disorder, current episode depressed, moderate: Secondary | ICD-10-CM | POA: Diagnosis not present

## 2020-12-24 MED ORDER — TESTOSTERONE CYPIONATE 200 MG/ML IM SOLN: 200.0000 mg | INTRAMUSCULAR | 0 refills | Status: DC

## 2020-12-25 LAB — PSA: Prostate Specific Ag, Serum: 2.9 ng/mL (ref 0.0–4.0)

## 2020-12-26 ENCOUNTER — Ambulatory Visit: Payer: Self-pay | Admitting: Urology

## 2021-01-04 IMAGING — CR DG ABDOMEN 2V
3 series · 3 of 3 positions shown · non-contrast
Comparison: CT scan 05/27/2015

CLINICAL DATA: Anxiety and confusion.  Abdominal pain.

EXAM:
ABDOMEN - 2 VIEW

[abdomen erect]
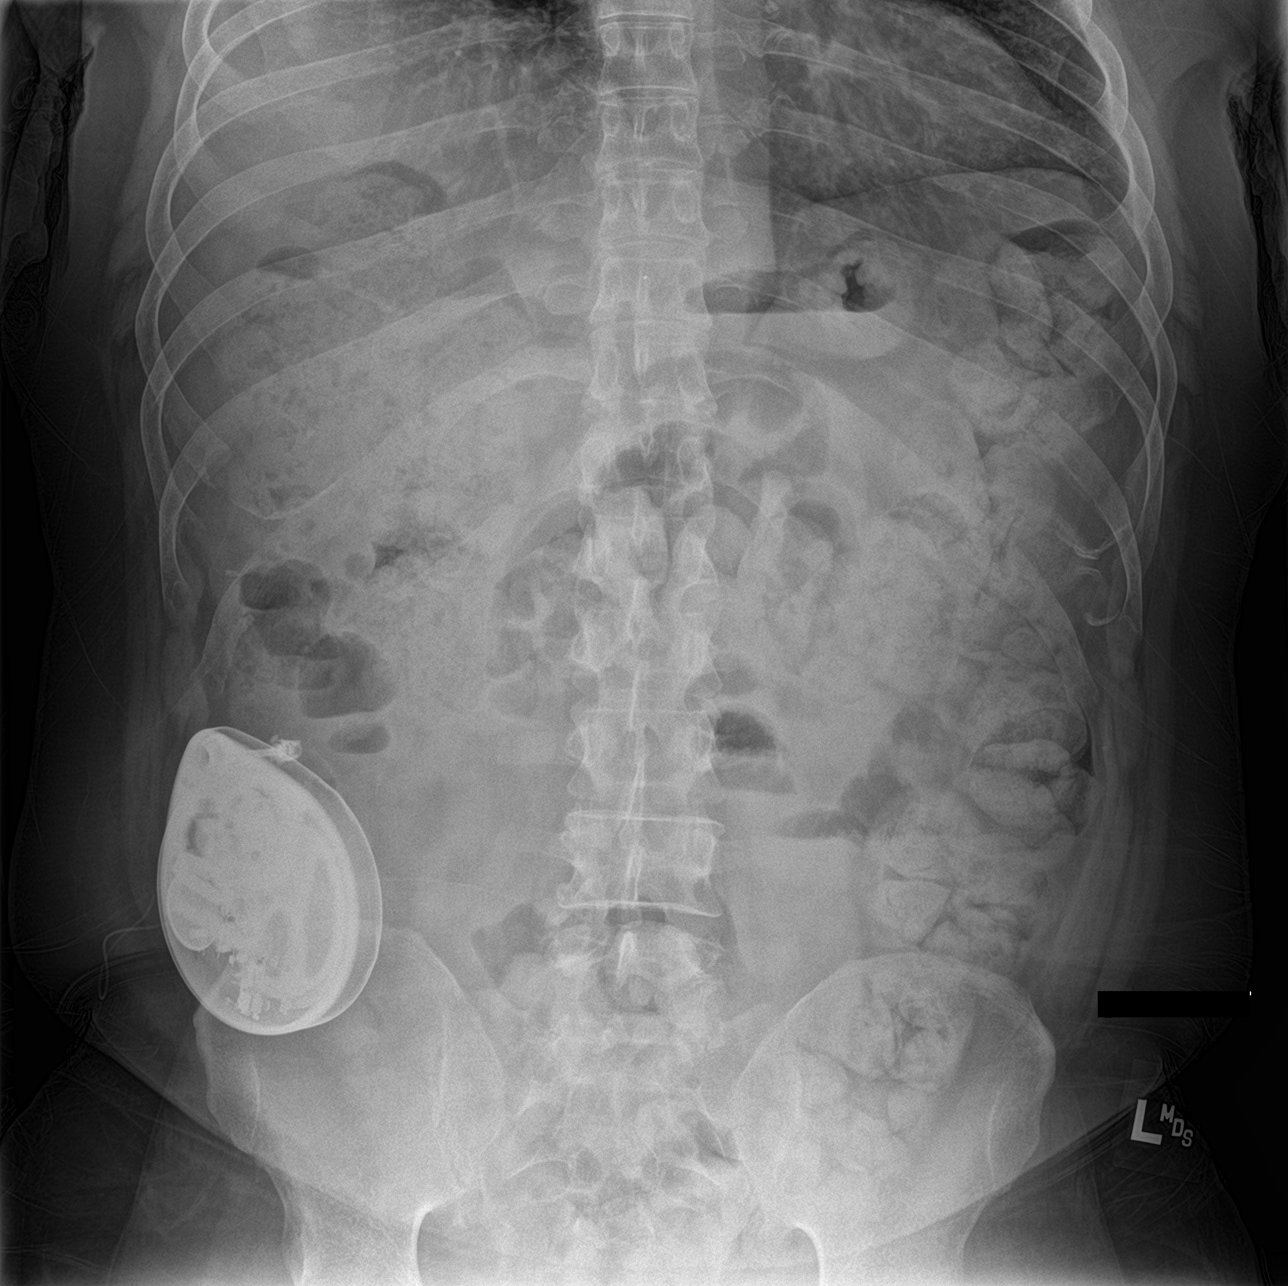

[abdomen supine (1 of 2)]
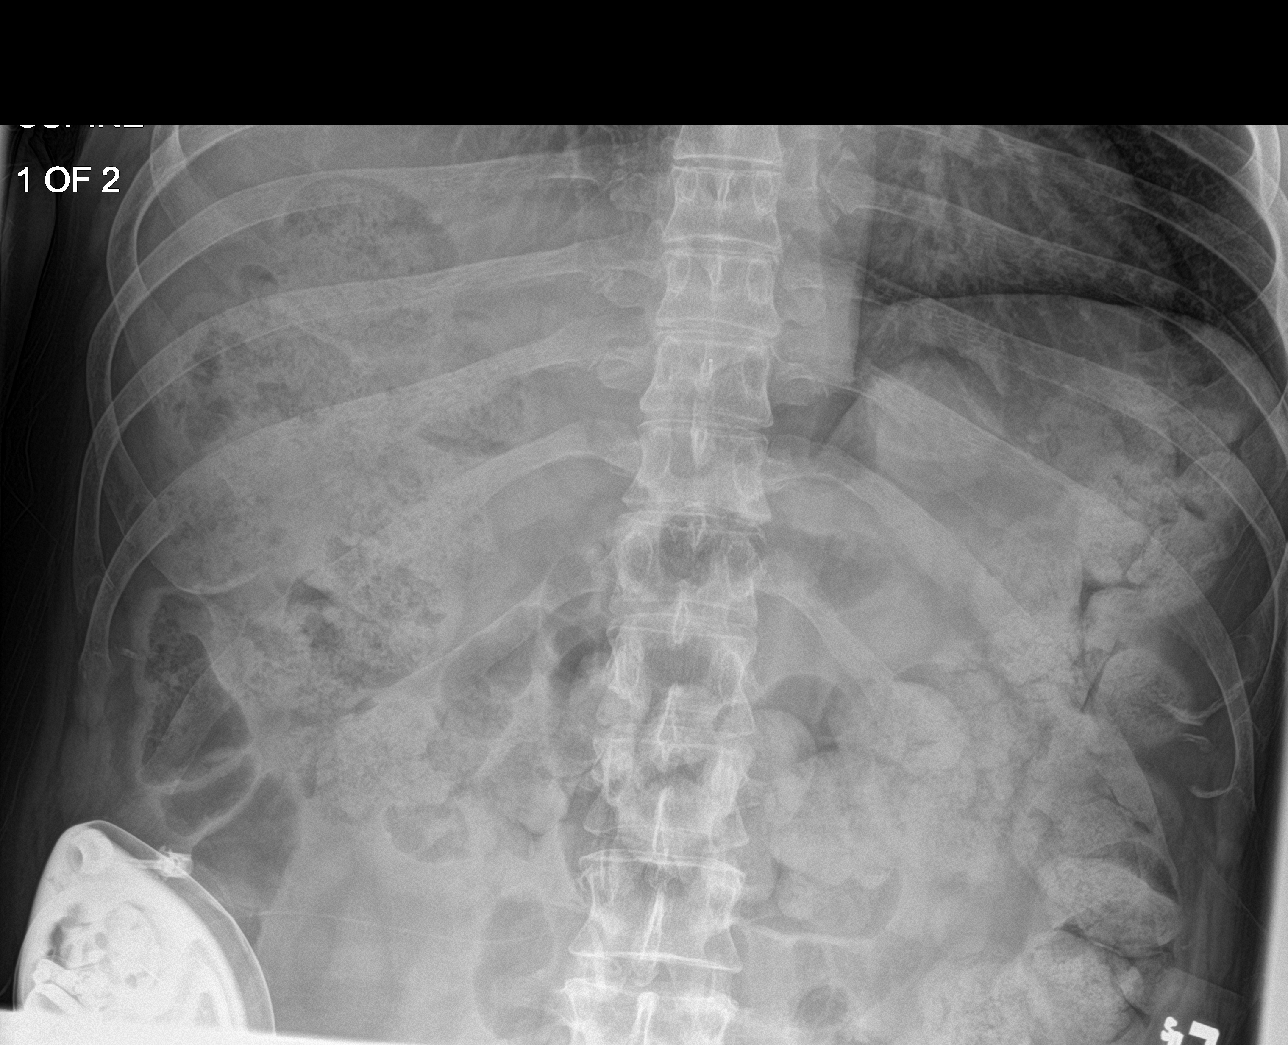

[abdomen supine (2 of 2)]
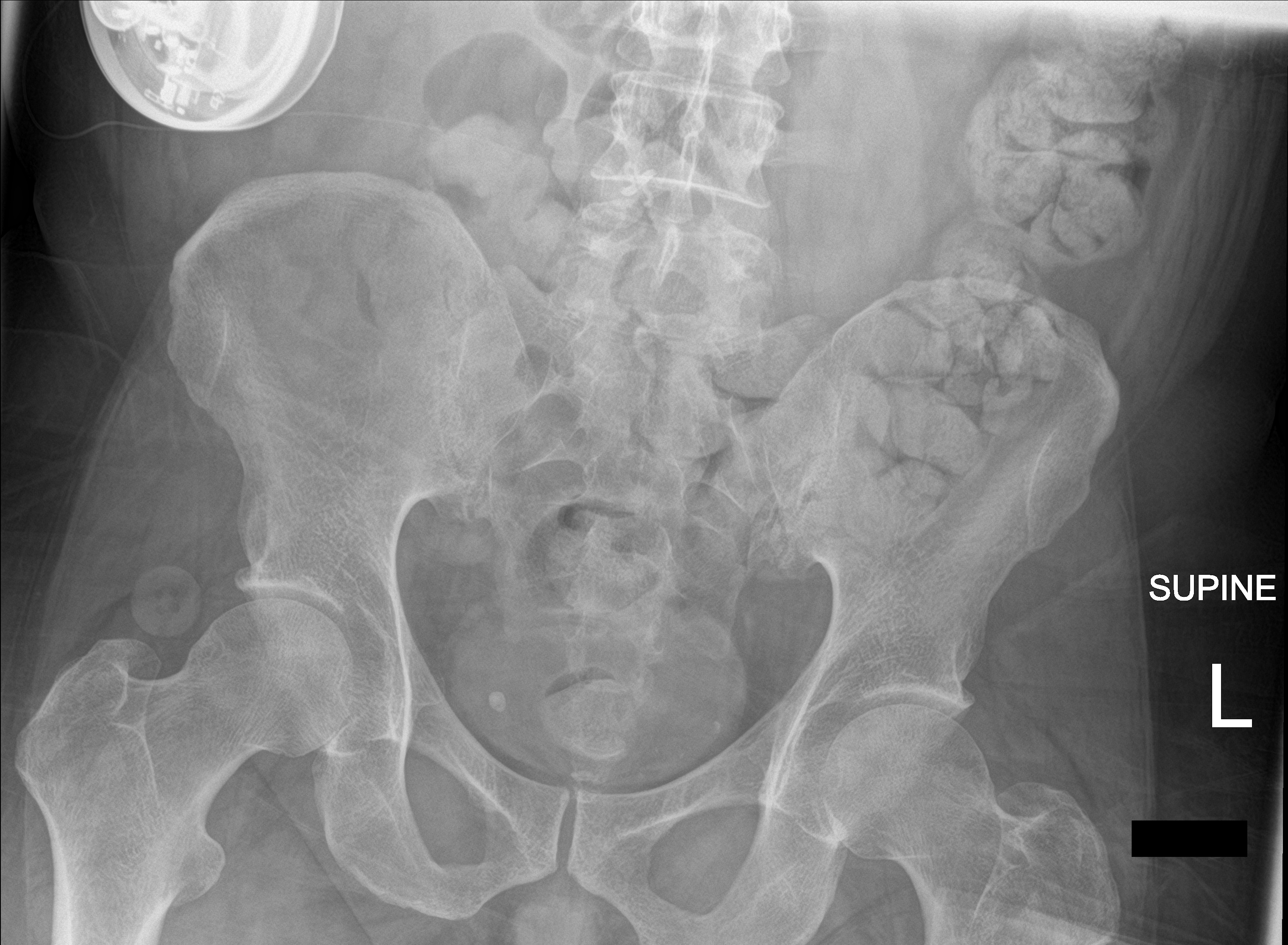

[3 of 3 positions shown; findings below may reference images not displayed]

FINDINGS: Moderate eventration of the right hemidiaphragm is noted. The left
lung base is clear.

Large amount of stool throughout the colon and down into the
rectosigmoid area consistent with significant constipation.
Scattered loops of small bowel with air but no findings for
obstruction. The soft tissue shadows of the abdomen are grossly
maintained. No worrisome calcifications. Stable intrathecal catheter
and pump.
IMPRESSION: Large amount of stool throughout the colon and down into the
rectosigmoid area consistent with significant constipation.

## 2021-01-07 ENCOUNTER — Other Ambulatory Visit: Payer: Self-pay | Admitting: Family Medicine

## 2021-01-07 ENCOUNTER — Telehealth: Payer: Self-pay

## 2021-01-07 DIAGNOSIS — E291 Testicular hypofunction: Secondary | ICD-10-CM

## 2021-01-07 DIAGNOSIS — N400 Enlarged prostate without lower urinary tract symptoms: Secondary | ICD-10-CM

## 2021-01-07 NOTE — Telephone Encounter (Signed)
Patient notified, scheduled for 57mo PSA blood draw, order placed. He states he has been using a coupon at the pharmacy for his injections and paying cash for the remaining.  [facility] states 10 (72ml vials) $36 [facility] - Coupon was sent vis Text for him to use in the future to save on cost, patient was very appreciative.  [facility] a new script dose not need to be sent at this time

## 2021-01-07 NOTE — Telephone Encounter (Signed)
-----   Message from Harle Battiest, PA-C sent at 01/07/2021 11:25 AM EDT ----- Please let Cody [name] know that his PSA is a little higher than we would like for his age group.  We also received a letter stating his insurance will no longer cover testosterone therapy.   I would like the PSA repeated in 3 months and is he paying out of pocket for his testosterone?

## 2021-01-23 DIAGNOSIS — G894 Chronic pain syndrome: Secondary | ICD-10-CM | POA: Diagnosis not present

## 2021-01-23 DIAGNOSIS — M5136 Other intervertebral disc degeneration, lumbar region: Secondary | ICD-10-CM | POA: Diagnosis not present

## 2021-01-23 DIAGNOSIS — Z978 Presence of other specified devices: Secondary | ICD-10-CM | POA: Diagnosis not present

## 2021-01-23 DIAGNOSIS — M545 Low back pain, unspecified: Secondary | ICD-10-CM | POA: Diagnosis not present

## 2021-01-24 DIAGNOSIS — F419 Anxiety disorder, unspecified: Secondary | ICD-10-CM | POA: Diagnosis not present

## 2021-01-24 DIAGNOSIS — M533 Sacrococcygeal disorders, not elsewhere classified: Secondary | ICD-10-CM | POA: Diagnosis not present

## 2021-01-27 DIAGNOSIS — F410 Panic disorder [episodic paroxysmal anxiety] without agoraphobia: Secondary | ICD-10-CM | POA: Diagnosis not present

## 2021-01-27 DIAGNOSIS — F609 Personality disorder, unspecified: Secondary | ICD-10-CM | POA: Diagnosis not present

## 2021-01-27 DIAGNOSIS — F3132 Bipolar disorder, current episode depressed, moderate: Secondary | ICD-10-CM | POA: Diagnosis not present

## 2021-01-27 DIAGNOSIS — F1421 Cocaine dependence, in remission: Secondary | ICD-10-CM | POA: Diagnosis not present

## 2021-02-03 ENCOUNTER — Other Ambulatory Visit: Payer: Self-pay | Admitting: Neurology

## 2021-02-11 ENCOUNTER — Other Ambulatory Visit: Payer: Self-pay | Admitting: Family Medicine

## 2021-02-11 DIAGNOSIS — E785 Hyperlipidemia, unspecified: Secondary | ICD-10-CM

## 2021-02-14 ENCOUNTER — Other Ambulatory Visit: Payer: Self-pay | Admitting: Family Medicine

## 2021-02-17 DIAGNOSIS — F410 Panic disorder [episodic paroxysmal anxiety] without agoraphobia: Secondary | ICD-10-CM | POA: Diagnosis not present

## 2021-02-17 DIAGNOSIS — F1421 Cocaine dependence, in remission: Secondary | ICD-10-CM | POA: Diagnosis not present

## 2021-02-17 DIAGNOSIS — F3132 Bipolar disorder, current episode depressed, moderate: Secondary | ICD-10-CM | POA: Diagnosis not present

## 2021-02-17 DIAGNOSIS — F609 Personality disorder, unspecified: Secondary | ICD-10-CM | POA: Diagnosis not present

## 2021-02-18 ENCOUNTER — Other Ambulatory Visit: Payer: Self-pay | Admitting: Family Medicine

## 2021-02-20 ENCOUNTER — Telehealth: Payer: Self-pay | Admitting: Family Medicine

## 2021-02-20 DIAGNOSIS — F3131 Bipolar disorder, current episode depressed, mild: Secondary | ICD-10-CM | POA: Diagnosis not present

## 2021-02-20 DIAGNOSIS — F1421 Cocaine dependence, in remission: Secondary | ICD-10-CM | POA: Diagnosis not present

## 2021-02-20 DIAGNOSIS — F411 Generalized anxiety disorder: Secondary | ICD-10-CM | POA: Diagnosis not present

## 2021-02-20 DIAGNOSIS — F5105 Insomnia due to other mental disorder: Secondary | ICD-10-CM | POA: Diagnosis not present

## 2021-02-20 NOTE — Telephone Encounter (Signed)
Pt reports BP reading as: 159/110- He denies headache, dizziness, and chest pain.

## 2021-02-20 NOTE — Telephone Encounter (Signed)
Please see about getting an OV set up to check him.  Thanks.

## 2021-02-20 NOTE — Telephone Encounter (Signed)
Gave pt message below. He will call tomorrow to make an OV.

## 2021-02-20 NOTE — Telephone Encounter (Signed)
Dr. Maryruth Bun reported BP elevation.  Please check with patient.  Thanks.

## 2021-02-21 NOTE — Telephone Encounter (Signed)
Patient's wife called and scheduled patient for Monday to see Dr Selena Batten, Dr Para March did not have opening until Thursday next week. ER precautions given.

## 2021-02-23 ENCOUNTER — Other Ambulatory Visit: Payer: Self-pay | Admitting: Family Medicine

## 2021-02-23 DIAGNOSIS — E039 Hypothyroidism, unspecified: Secondary | ICD-10-CM

## 2021-02-23 DIAGNOSIS — E785 Hyperlipidemia, unspecified: Secondary | ICD-10-CM

## 2021-02-23 DIAGNOSIS — R7309 Other abnormal glucose: Secondary | ICD-10-CM

## 2021-02-23 DIAGNOSIS — E559 Vitamin D deficiency, unspecified: Secondary | ICD-10-CM

## 2021-02-23 NOTE — Telephone Encounter (Signed)
Noted. Thanks.

## 2021-02-24 ENCOUNTER — Encounter: Payer: Self-pay | Admitting: Family Medicine

## 2021-02-24 ENCOUNTER — Other Ambulatory Visit: Payer: Self-pay

## 2021-02-24 ENCOUNTER — Ambulatory Visit (INDEPENDENT_AMBULATORY_CARE_PROVIDER_SITE_OTHER): Payer: BC Managed Care – PPO | Admitting: Family Medicine

## 2021-02-24 DIAGNOSIS — I10 Essential (primary) hypertension: Secondary | ICD-10-CM | POA: Insufficient documentation

## 2021-02-24 DIAGNOSIS — F17290 Nicotine dependence, other tobacco product, uncomplicated: Secondary | ICD-10-CM | POA: Diagnosis not present

## 2021-02-24 NOTE — Assessment & Plan Note (Signed)
Pt is motivated to quit vaping. Encouraged cessation to help lower BP

## 2021-02-24 NOTE — Assessment & Plan Note (Addendum)
BP normal today. Suspect elevated bp are multi-factorial (pt with chronic pain, significant anxiety) poor measurement (advised sitting 5 minutes piror to check), untreated OSA (he is planning to request CPAP), nicotine exposure (planning to quit vapping). Discussed option of starting medication but given lower bp today, he will do home monitoring and work towards treating sleep apnea and quitting nicotine. Labs recently normal and has f/u with pcp in 2 weeks so will defer blood work to pcp visit.

## 2021-02-24 NOTE — Patient Instructions (Addendum)
Your blood pressure high.   High blood pressure increases your risk for heart attack and stroke.   On log - Are you in pain? Are you anxious? Recent vaping? Other symptoms - new headache, breathing difficulty, chest pain   Please check your blood pressure 2-4 times a week.   To check your blood pressure 1) Sit in a quiet and relaxed place for 5 minutes 2) Make sure your feet are flat on the ground 3) Consider checking first thing in the morning   Normal blood pressure is less than 140/90 Ideally you blood pressure should be around 120/80  Other ways you can reduce your blood pressure:  1) Regular exercise -- Try to get 150 minutes (30 minutes, 5 days a week) of moderate to vigorous aerobic excercise -- Examples: brisk walking (2.5 miles per hour), water aerobics, dancing, gardening, tennis, biking slower than 10 miles per hour 2) DASH Diet - low fat meats, more fresh fruits and vegetables, whole grains, low salt 3) Quit smoking if you smoke 4) Loose 5-10% of your body weight 5) Treat your sleep apnea

## 2021-02-24 NOTE — Progress Notes (Signed)
Subjective:     Cody [name] is a 50 y.o. male presenting for Hypertension (Elevate, up and down.)     Hypertension   #HTN - has been up and down for over a year - checks morning and evening - while seated - 138/96 yesterday - does not sit for 5 minutes prior to checking - is checking 3 times in a row on the same arm - with variable changes - symptoms when BP is high - jaw numbness, whooshing in ear, sob  Vaping - if he vapes more then his blood pressure is worse - does have plans to quit soon  #OSA - diagnosed but has not started treatment - did notice this is worse when bp is high   Review of Systems  HENT:  Positive for tinnitus.   Respiratory:  Positive for chest tightness (with high bp).     Social History   Tobacco Use  Smoking Status Former   Packs/day: 0.25   Types: E-cigarettes, Cigarettes   Quit date: 08/10/2009   Years since quitting: 11.5  Smokeless Tobacco Never  Tobacco Comments   smoked age intermittently age [age]; up to 2 cigarettes/ day         Objective:    BP Readings from Last 3 Encounters:  02/24/21 108/72  12/24/20 131/82  11/12/20 139/88   Wt Readings from Last 3 Encounters:  02/24/21 205 lb 8 oz (93.2 kg)  12/24/20 200 lb (90.7 kg)  11/12/20 203 lb (92.1 kg)    BP 108/72   Pulse 96   Temp 98 F (36.7 C)   Resp 18   Wt 205 lb 8 oz (93.2 kg)   SpO2 95%   BMI 34.20 kg/m    Physical Exam Constitutional:      Appearance: Normal appearance. He is not ill-appearing or diaphoretic.  HENT:     Right Ear: External ear normal.     Left Ear: External ear normal.  Eyes:     General: No scleral icterus.    Extraocular Movements: Extraocular movements intact.     Conjunctiva/sclera: Conjunctivae normal.  Cardiovascular:     Rate and Rhythm: Normal rate and regular rhythm.     Heart sounds: No murmur heard. Pulmonary:     Effort: Pulmonary effort is normal. No respiratory distress.     Breath sounds: Normal  breath sounds. No wheezing.  Musculoskeletal:     Cervical back: Neck supple.  Skin:    General: Skin is warm and dry.  Neurological:     Mental Status: He is alert. Mental status is at baseline.  Psychiatric:        Mood and Affect: Mood normal.          Assessment & Plan:   Problem List Items Addressed This Visit       Cardiovascular and Mediastinum   Hypertension    BP normal today. Suspect elevated bp are multi-factorial (pt with chronic pain, significant anxiety) poor measurement (advised sitting 5 minutes piror to check), untreated OSA (he is planning to request CPAP), nicotine exposure (planning to quit vapping). Discussed option of starting medication but given lower bp today, he will do home monitoring and work towards treating sleep apnea and quitting nicotine.         Relevant Medications   atorvastatin (LIPITOR) 10 MG tablet     Other   Vaping nicotine dependence, tobacco product    Pt is motivated to quit vaping. Encouraged cessation to help lower BP  Return if symptoms worsen or fail to improve.  Lesleigh Noe, MD  This visit occurred during the SARS-CoV-2 public health emergency.  Safety protocols were in place, including screening questions prior to the visit, additional usage of staff PPE, and extensive cleaning of exam room while observing appropriate contact time as indicated for disinfecting solutions.

## 2021-02-25 ENCOUNTER — Other Ambulatory Visit (INDEPENDENT_AMBULATORY_CARE_PROVIDER_SITE_OTHER): Payer: BC Managed Care – PPO

## 2021-02-25 DIAGNOSIS — E559 Vitamin D deficiency, unspecified: Secondary | ICD-10-CM

## 2021-02-25 DIAGNOSIS — R7309 Other abnormal glucose: Secondary | ICD-10-CM

## 2021-02-25 DIAGNOSIS — E039 Hypothyroidism, unspecified: Secondary | ICD-10-CM

## 2021-02-25 DIAGNOSIS — E785 Hyperlipidemia, unspecified: Secondary | ICD-10-CM | POA: Diagnosis not present

## 2021-02-25 NOTE — Addendum Note (Signed)
Addended by: Alvina Chou on: 02/25/2021 08:30 AM   Modules accepted: Orders

## 2021-02-26 LAB — CBC WITH DIFFERENTIAL/PLATELET
Basophils Absolute: 0.1 10*3/uL (ref 0.0–0.2)
Basos: 1 %
EOS (ABSOLUTE): 0.2 10*3/uL (ref 0.0–0.4)
Eos: 2 %
Hematocrit: 48.5 % (ref 37.5–51.0)
Hemoglobin: 15.8 g/dL (ref 13.0–17.7)
Immature Grans (Abs): 0 10*3/uL (ref 0.0–0.1)
Immature Granulocytes: 0 %
Lymphocytes Absolute: 3.4 10*3/uL — ABNORMAL HIGH (ref 0.7–3.1)
Lymphs: 34 %
MCH: 27.5 pg (ref 26.6–33.0)
MCHC: 32.6 g/dL (ref 31.5–35.7)
MCV: 84 fL (ref 79–97)
Monocytes Absolute: 0.7 10*3/uL (ref 0.1–0.9)
Monocytes: 7 %
Neutrophils Absolute: 5.6 10*3/uL (ref 1.4–7.0)
Neutrophils: 56 %
Platelets: 290 10*3/uL (ref 150–450)
RBC: 5.75 x10E6/uL (ref 4.14–5.80)
RDW: 14.7 % (ref 11.6–15.4)
WBC: 10 10*3/uL (ref 3.4–10.8)

## 2021-02-26 LAB — COMPREHENSIVE METABOLIC PANEL
ALT: 17 IU/L (ref 0–44)
AST: 24 IU/L (ref 0–40)
Albumin/Globulin Ratio: 2 (ref 1.2–2.2)
Albumin: 4.6 g/dL (ref 4.0–5.0)
Alkaline Phosphatase: 104 IU/L (ref 44–121)
BUN/Creatinine Ratio: 5 — ABNORMAL LOW (ref 9–20)
BUN: 7 mg/dL (ref 6–24)
Bilirubin Total: <0.2 mg/dL (ref 0.0–1.2)
CO2: 28 mmol/L (ref 20–29)
Calcium: 9.1 mg/dL (ref 8.7–10.2)
Chloride: 100 mmol/L (ref 96–106)
Creatinine, Ser: 1.44 mg/dL — ABNORMAL HIGH (ref 0.76–1.27)
Globulin, Total: 2.3 g/dL (ref 1.5–4.5)
Glucose: 117 mg/dL — ABNORMAL HIGH (ref 65–99)
Potassium: 4.2 mmol/L (ref 3.5–5.2)
Sodium: 142 mmol/L (ref 134–144)
Total Protein: 6.9 g/dL (ref 6.0–8.5)
eGFR: 60 mL/min/{1.73_m2} (ref >59)

## 2021-02-26 LAB — LIPID PANEL
Chol/HDL Ratio: 4.1 ratio (ref 0.0–5.0)
Cholesterol, Total: 124 mg/dL (ref 100–199)
HDL: 30 mg/dL — ABNORMAL LOW (ref >39)
LDL Chol Calc (NIH): 71 mg/dL (ref 0–99)
Triglycerides: 126 mg/dL (ref 0–149)
VLDL Cholesterol Cal: 23 mg/dL (ref 5–40)

## 2021-02-26 LAB — VITAMIN D 25 HYDROXY (VIT D DEFICIENCY, FRACTURES): Vit D, 25-Hydroxy: 39.2 ng/mL (ref 30.0–100.0)

## 2021-02-26 LAB — TSH: TSH: 3.49 u[IU]/mL (ref 0.450–4.500)

## 2021-02-26 LAB — HEMOGLOBIN A1C
Est. average glucose Bld gHb Est-mCnc: 128 mg/dL
Hgb A1c MFr Bld: 6.1 % — ABNORMAL HIGH (ref 4.8–5.6)

## 2021-02-27 ENCOUNTER — Other Ambulatory Visit: Payer: Self-pay | Admitting: Family Medicine

## 2021-02-27 DIAGNOSIS — R7989 Other specified abnormal findings of blood chemistry: Secondary | ICD-10-CM

## 2021-03-03 ENCOUNTER — Other Ambulatory Visit: Payer: Self-pay

## 2021-03-03 ENCOUNTER — Ambulatory Visit (INDEPENDENT_AMBULATORY_CARE_PROVIDER_SITE_OTHER): Payer: BC Managed Care – PPO | Admitting: Family Medicine

## 2021-03-03 ENCOUNTER — Encounter: Payer: Self-pay | Admitting: Family Medicine

## 2021-03-03 VITALS — BP 138/90 | HR 95 | Temp 98.2°F | Ht 65.0 in | Wt 205.0 lb

## 2021-03-03 DIAGNOSIS — E039 Hypothyroidism, unspecified: Secondary | ICD-10-CM

## 2021-03-03 DIAGNOSIS — G43709 Chronic migraine without aura, not intractable, without status migrainosus: Secondary | ICD-10-CM

## 2021-03-03 DIAGNOSIS — F3170 Bipolar disorder, currently in remission, most recent episode unspecified: Secondary | ICD-10-CM

## 2021-03-03 DIAGNOSIS — R7989 Other specified abnormal findings of blood chemistry: Secondary | ICD-10-CM

## 2021-03-03 DIAGNOSIS — E785 Hyperlipidemia, unspecified: Secondary | ICD-10-CM

## 2021-03-03 DIAGNOSIS — I10 Essential (primary) hypertension: Secondary | ICD-10-CM | POA: Diagnosis not present

## 2021-03-03 MED ORDER — BUSPIRONE HCL 10 MG PO TABS: 10.0000 mg | ORAL_TABLET | Freq: Two times a day (BID) | ORAL | Status: AC

## 2021-03-03 MED ORDER — ESCITALOPRAM OXALATE 20 MG PO TABS: 20.0000 mg | ORAL_TABLET | Freq: Every day | ORAL | Status: DC

## 2021-03-03 MED ORDER — POLYETHYLENE GLYCOL 3350 17 GM/SCOOP PO POWD: 17.0000 g | ORAL | Status: AC | PRN

## 2021-03-03 NOTE — Progress Notes (Signed)
This visit occurred during the SARS-CoV-2 public health emergency.  Safety protocols were in place, including screening questions prior to the visit, additional usage of staff PPE, and extensive cleaning of exam room while observing appropriate contact time as indicated for disinfecting solutions.  Cr elevation d/w pt.  No nsaid use.  Recheck pending.  D/w pt.    Most recent T level wnl, d/w pt.  Slower urinary stream noted in the last few months.  No dysuria o/w.    Hypertension:    No meds yet, has had variable BP readings recently.   Chest pain with exertion: not when BP controlled.  D/w pt.  No sx now Edema: no Short of breath: no Labs d/w pt.    Constipation resolved with miralax used QOD.  D/w pt.    Hypothyroidism.  Compliant with replacement.  TSH wnl.  No neck mass.  No dysphagia.    Elevated Cholesterol: Using medications without problems: on atorvastatin Muscle aches: he has aches at baseline, unclear if from statin.  D/w pt about continuing his statin at baseline.   Diet compliance: encouraged Exercise: encouraged as tolerated.    Mood meds per psych clinic.   Migraine tx per neuro.    Acneiform rash noted on back since he started testosterone replacement.  No feve  Meds, vitals, and allergies reviewed.   ROS: Per HPI unless specifically indicated in ROS section   GEN: nad, alert and oriented HEENT: ncat CV: rrr.  PULM: ctab, no inc wob ABD: soft, +bs EXT: no edema SKIN: no acute rash but acneiform changes noted on the back.

## 2021-03-03 NOTE — Patient Instructions (Addendum)
Go to the lab on the way out.   If you have mychart we'll likely use that to update you.    Take care.  Glad to see you. Check with your insurance to see if they will cover the tetanus shot. We'll see about options when I get your labs back.   If your BP is persistently >140/>90 then let me know.

## 2021-03-04 DIAGNOSIS — G894 Chronic pain syndrome: Secondary | ICD-10-CM | POA: Diagnosis not present

## 2021-03-04 DIAGNOSIS — M5136 Other intervertebral disc degeneration, lumbar region: Secondary | ICD-10-CM | POA: Diagnosis not present

## 2021-03-04 DIAGNOSIS — F1190 Opioid use, unspecified, uncomplicated: Secondary | ICD-10-CM | POA: Diagnosis not present

## 2021-03-04 DIAGNOSIS — M47816 Spondylosis without myelopathy or radiculopathy, lumbar region: Secondary | ICD-10-CM | POA: Diagnosis not present

## 2021-03-04 LAB — BASIC METABOLIC PANEL
BUN/Creatinine Ratio: 6 — ABNORMAL LOW (ref 9–20)
BUN: 8 mg/dL (ref 6–24)
CO2: 24 mmol/L (ref 20–29)
Calcium: 9.4 mg/dL (ref 8.7–10.2)
Chloride: 99 mmol/L (ref 96–106)
Creatinine, Ser: 1.28 mg/dL — ABNORMAL HIGH (ref 0.76–1.27)
Glucose: 118 mg/dL — ABNORMAL HIGH (ref 65–99)
Potassium: 4.7 mmol/L (ref 3.5–5.2)
Sodium: 141 mmol/L (ref 134–144)
eGFR: 69 mL/min/{1.73_m2} (ref >59)

## 2021-03-05 DIAGNOSIS — M533 Sacrococcygeal disorders, not elsewhere classified: Secondary | ICD-10-CM | POA: Diagnosis not present

## 2021-03-06 ENCOUNTER — Other Ambulatory Visit: Payer: Self-pay | Admitting: Family Medicine

## 2021-03-06 MED ORDER — DOXYCYCLINE HYCLATE 100 MG PO TABS: 100.0000 mg | ORAL_TABLET | Freq: Two times a day (BID) | ORAL | 0 refills | Status: DC

## 2021-03-06 NOTE — Assessment & Plan Note (Signed)
Improved on replacement.  See notes on labs especially regarding acneiform changes.

## 2021-03-06 NOTE — Assessment & Plan Note (Signed)
Recheck blood pressure improved.  No change in medications at this point.  Recheck creatinine pending.  See notes on labs.

## 2021-03-06 NOTE — Assessment & Plan Note (Signed)
Per neurology.  I will defer.  He agrees. °

## 2021-03-06 NOTE — Assessment & Plan Note (Signed)
Continue atorvastatin, d/w pt.  He agrees.

## 2021-03-06 NOTE — Assessment & Plan Note (Signed)
Per Psychiatry.  I will defer.  He agrees.

## 2021-03-06 NOTE — Assessment & Plan Note (Signed)
Compliant with replacement.  TSH wnl.  No neck mass.  No dysphagia.  continue levothyroxine replacement.

## 2021-03-07 DIAGNOSIS — F411 Generalized anxiety disorder: Secondary | ICD-10-CM | POA: Diagnosis not present

## 2021-03-07 DIAGNOSIS — F5105 Insomnia due to other mental disorder: Secondary | ICD-10-CM | POA: Diagnosis not present

## 2021-03-07 DIAGNOSIS — F1421 Cocaine dependence, in remission: Secondary | ICD-10-CM | POA: Diagnosis not present

## 2021-03-07 DIAGNOSIS — F3131 Bipolar disorder, current episode depressed, mild: Secondary | ICD-10-CM | POA: Diagnosis not present

## 2021-03-10 ENCOUNTER — Encounter: Payer: Self-pay | Admitting: Family Medicine

## 2021-03-10 ENCOUNTER — Ambulatory Visit: Payer: Medicare Other | Admitting: Family Medicine

## 2021-03-10 NOTE — Progress Notes (Deleted)
No chief complaint on file.    HISTORY OF PRESENT ILLNESS: 03/10/21 ALL:  Cody [name] is a 50 y.o. male here today for follow up for migraines. MRI was normal. He was started on Ajovy.    HISTORY (copied from Dr Cathren Laine previous note)  HPI:  Cody [name] is a 50 y.o. male here as requested by Tonia Ghent, MD for headache. PMHx already evaluated by neurology for headache and severe sleep apnea Dr. Rowan Blase clinic 2021, MRI and MRV ordered, PTSD, hypothyroidism, diagnosed with nonepileptic spells by Dr. Angela Nevin, diagnosed with severe sleep apnea by Dr. Manuella Ghazi May 2021 a CPAP machine was ordered for him, chronic pain on a morphine pump, hyperlipidemia, depression, degenerative disc disease, chronic back pain, bipolar 2, anxiety, cigarette smoker, obesity, chronic pain, headache.  I reviewed Dr. Elveria Rising notes: He seen on August 30, 2020 complaining of headache for about a week, at the base of the skull, radiates over the right more than left temple, right side of the head, he vomited from pain in the prior 24 hours, use cold compress, later in bed in a dark room, he was able to sleep some and the pain got better, but the headache continued frontal right side, no known fever, stiff neck no speech or motor changes.  In April 2021 he had a normal MRI of the brain.  Per Dr. Elveria Rising evaluation, concerning for migraine, he was prescribed Imitrex and Phenergan as needed and referred to neurology.   Patient has been seen by neurology in the past, in January 2021 he was seen at the [facility] clinic by Dr. Manuella Ghazi, for episodic confusion, more so in the morning with associated disequilibrium, history of bipolar disorder and substance abuse, chronic pain on morphine pump with some episodes of seizure-like activity likely nonepileptic spells.  Dr. Manuella Ghazi recommended EEG, home sleep study, neurocognitive testing, head CT October 2020 was unremarkable.  They ordered home sleep test, speech therapy for  cognitive training, neuropsychology for neurocognitive testing, they checked B12, B1, folate, RPR, Lyme, ACE, ESR, CRP, ANA, IFE, rheumatoid arthritis factor.   Patient is here alone and reports: The first headache was in January of this year, "out of the blue", 2-3 times a week, 4-7/10 in pain, Imitrex helps and sp does promethazine. He has nausea and vomiting, worst headache of life so severe he was terrified, they are on the right side and on the top, pulsating and pounding and throbbing, excruciating, he may see sparkles in the eyes he also gets pain in the back of the neck and pressure, he takes imitrext at that point. The headaches can last 24-48 hours, with treatment under a day, photophobia/phonophobia, since January 1/2 the month of migraines, he is worried, he can't drive with the migraines, no family history, acute onset, no medication overuse, does not always have an aura. He is trying to examine his lifestyle, stress, sleep and nothing has been discovered, they come after 12, black out helps, right eye water, not stabbing, he has pressure behind his eye.  He has numbness in the right side of the scalp and face with the headaches, but no focal weakness. No other focal neurologic deficits, associated symptoms, inciting events or modifiable factors.   Reviewed notes, labs and imaging from outside physicians, which showed:   CMP with elevated glucose 06/13/2020 otherwise unremarkable   Celexa, lexapro, lithium, prednisone, propranolol, seroquel, imitrex, lamictal, amitriptyline, topamax with lamictal contraindicated   MRI brain 11/23/2019: FINDINGS: Brain: There  is no acute infarction or intracranial hemorrhage. There is no intracranial mass, mass effect, or edema. There is no hydrocephalus or extra-axial fluid collection. Ventricles and sulci are normal in size and configuration. No abnormal enhancement.   Vascular: Major vessel flow voids at the skull base are preserved.   Skull and upper  cervical spine: Normal marrow signal is preserved.   Sinuses/Orbits: Paranasal sinuses are aerated. Orbits are unremarkable.   Other: Sella is unremarkable.  Mastoid air cells are clear.   IMPRESSION: Normal MRI of the brain.   REVIEW OF SYSTEMS: Out of a complete 14 system review of symptoms, the patient complains only of the following symptoms, and all other reviewed systems are negative.   ALLERGIES: Allergies  Allergen Reactions   Neurontin [Gabapentin]     lethargic   Vancomycin     Swelling    Lithium     tremor   Mirtazapine     REACTION: lethargy ( Remeron)     HOME MEDICATIONS: Outpatient Medications Prior to Visit  Medication Sig Dispense Refill   AJOVY 225 MG/1.5ML SOAJ INJECT 225 MG INTO THE SKIN EVERY 30 (THIRTY) DAYS. 1.5 mL 11   AMBULATORY NON FORMULARY MEDICATION Morphine Pump Implant     atorvastatin (LIPITOR) 10 MG tablet Take 1 tablet by mouth at bedtime.     busPIRone (BUSPAR) 10 MG tablet Take 1 tablet (10 mg total) by mouth 2 (two) times daily.     Cholecalciferol (VITAMIN D) 50 MCG (2000 UT) CAPS Take 1 capsule by mouth daily.     doxycycline (VIBRA-TABS) 100 MG tablet Take 1 tablet (100 mg total) by mouth 2 (two) times daily. 14 tablet 0   escitalopram (LEXAPRO) 20 MG tablet Take 1 tablet (20 mg total) by mouth daily.     HYDROmorphone (DILAUDID) 2 MG tablet Take 2 mg by mouth 4 (four) times daily as needed.     hydrOXYzine (ATARAX/VISTARIL) 50 MG tablet Take 50 mg by mouth 2 (two) times daily.     lamoTRIgine (LAMICTAL) 200 MG tablet Take 1 tablet (200 mg total) by mouth 2 (two) times daily.     levothyroxine (SYNTHROID) 75 MCG tablet TAKE 1 TABLET BY MOUTH  DAILY BEFORE BREAKFAST . 90 tablet 3   Omega-3 1000 MG CAPS Take 2 each by mouth daily.     OVER THE COUNTER MEDICATION      oxazepam (SERAX) 10 MG capsule Take 20 mg by mouth at bedtime as needed for sleep or anxiety.     polyethylene glycol powder (GLYCOLAX/MIRALAX) 17 GM/SCOOP powder Take  17 g by mouth every other day as needed.     promethazine (PHENERGAN) 25 MG tablet TAKE 1/2 -1 TABLETS BY MOUTH EVERY 8 (EIGHT) HOURS AS NEEDED FOR NAUSEA OR VOMITING. 20 tablet 1   SUMAtriptan (IMITREX) 100 MG tablet TAKE 1 TABLET (100 MG TOTAL) BY MOUTH EVERY 2 (TWO) HOURS AS NEEDED FOR MIGRAINE (MAX 2 DOSES IN 24 HOURS.). MAY REPEAT IN 2 HOURS IF HEADACHE PERSISTS OR RECURS. 20 tablet 4   testosterone cypionate (DEPOTESTOSTERONE CYPIONATE) 200 MG/ML injection Inject 1 mL (200 mg total) into the muscle every 14 (fourteen) days. 6 mL 0   tiZANidine (ZANAFLEX) 4 MG tablet Take 4 mg by mouth 3 (three) times daily.     ziprasidone (GEODON) 60 MG capsule Take 60 mg by mouth daily.     No facility-administered medications prior to visit.     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)/ Dr Elsie Stain   PTSD (post-traumatic stress disorder)    suicide attempt X1, gestureX 1     PAST SURGICAL HISTORY: Past Surgical History:  Procedure Laterality Date   APPENDECTOMY     peritonitis   INTRATHECAL PUMP IMPLANTATION  2007   LAMINECTOMY  2004   arterial injury, transfused 7 units pc, implant surgery    MANDIBLE FRACTURE SURGERY     mugged   THORACIC DISCECTOMY     T10   UPPER GASTROINTESTINAL ENDOSCOPY  2006   gastritis     FAMILY HISTORY: Family History  Problem Relation Age of Onset   Ulcers Mother    Depression Mother    Thyroid disease Mother        hypo   Stroke Father 23   Thyroid disease Father        hypo   Lung cancer Maternal  Grandmother    Thyroid disease Maternal Grandmother        hypo   Colon cancer Maternal Grandfather    Heart attack Maternal Grandfather 55   Esophageal cancer Maternal Grandfather    Diabetes Neg Hx    Prostate cancer Neg Hx    Kidney cancer Neg Hx    Bladder Cancer Neg Hx    Migraines Neg Hx    Headache Neg Hx      SOCIAL HISTORY: Social History   Socioeconomic History   Marital status: Married    Spouse name: Not on file   Number of children: 0   Years of education: Not on file   Highest education level: Not on file  Occupational History   Occupation: Disabled  Tobacco Use   Smoking status: Former    Packs/day: 0.25    Types: E-cigarettes, Cigarettes    Quit date: 08/10/2009    Years since quitting: 11.5   Smokeless tobacco: Never   Tobacco comments:    smoked age intermittently age [age]; up to 2 cigarettes/ day   Vaping Use   Vaping Use: Never used  Substance and Sexual Activity   Alcohol use: No   Drug use: No   Sexual activity: Not on file  Other Topics Concern   Not on file  Social History Narrative   From [place].     [address] Grad   To [place] 2000   Disability from bipolar affective disorder, prev accounting.     Married 1997   No kids   Enjoys walking, travel.     Social Determinants of Health   Financial Resource Strain: Not on file  Food Insecurity: Not on file  Transportation Needs: Not on file  Physical Activity: Not on file  Stress: Not on file  Social Connections: Not on file  Intimate Partner Violence: Not on file     PHYSICAL EXAM  There were no vitals filed for this visit. There is no height or weight on file to calculate BMI.   Generalized: Well developed, in no acute distress  Cardiology: normal rate and rhythm, no murmur auscultated  Respiratory: clear to auscultation bilaterally    Neurological examination  Mentation: Alert oriented to time, place, history taking. Follows all commands speech and language  fluent Cranial nerve II-XII: Pupils were equal round reactive to light. Extraocular movements were full, visual field were full on confrontational test. Facial sensation and strength were normal. Uvula tongue midline. Head turning and shoulder shrug  were normal and symmetric. Motor: The motor testing reveals 5 over 5 strength of all 4 extremities. Good symmetric motor tone is noted throughout.  Sensory: Sensory testing is intact to soft touch on all 4 extremities. No evidence of extinction is noted.  Coordination: Cerebellar testing reveals good finger-nose-finger and heel-to-shin bilaterally.  Gait and station: Gait is normal. Tandem gait is normal. Romberg is negative. No drift is seen.  Reflexes: Deep tendon reflexes are symmetric and normal bilaterally.    DIAGNOSTIC DATA (LABS, IMAGING, TESTING) - I reviewed patient records, labs, notes, testing and imaging myself where available.  Lab Results  Component Value Date   WBC 10.0 02/25/2021   HGB 15.8 02/25/2021   HCT 48.5 02/25/2021   MCV 84 02/25/2021   PLT 290 02/25/2021      Component Value Date/Time   NA 141 03/03/2021 1256   NA 142 03/23/2014 1752   K 4.7 03/03/2021 1256   K 3.9 03/23/2014 1752   CL 99 03/03/2021 1256   CL 103 03/23/2014 1752   CO2 24 03/03/2021 1256   CO2 28 03/23/2014 1752   GLUCOSE 118 (H) 03/03/2021 1256   GLUCOSE 95 05/16/2019 1542   GLUCOSE 166 (H) 03/23/2014 1752   BUN 8 03/03/2021 1256   BUN 14 03/23/2014 1752   CREATININE 1.28 (H) 03/03/2021 1256   CREATININE 1.14 12/09/2016 0000   CREATININE 1.33 (H) 03/23/2014 1752   CALCIUM 9.4 03/03/2021 1256   CALCIUM 9.2 03/23/2014 1752   PROT 6.9 02/25/2021 0837   PROT 8.3 (H) 03/23/2014 1752   ALBUMIN 4.6 02/25/2021 0837   ALBUMIN 4.2 03/23/2014 1752   AST 24 02/25/2021 0837   AST 22 12/09/2016 0000   ALT 17 02/25/2021 0837   ALT 22 12/09/2016 0000   ALT 84 (H) 03/23/2014 1752   ALKPHOS 104 02/25/2021 0837   ALKPHOS 76 03/23/2014 1752    BILITOT <0.2 02/25/2021 0837   BILITOT 0.5 03/23/2014 1752   GFRNONAA 101 06/13/2020 1347   GFRNONAA >60 03/23/2014 1752   GFRAA 117 06/13/2020 1347   GFRAA >60 03/23/2014 1752   Lab Results  Component Value Date   CHOL 124 02/25/2021   HDL 30 (L) 02/25/2021   LDLCALC 71 02/25/2021   LDLDIRECT 176.4 10/11/2012   TRIG 126 02/25/2021   CHOLHDL 4.1 02/25/2021   Lab Results  Component Value Date   HGBA1C 6.1 (H) 02/25/2021   No results found for: [id number] Lab Results  Component Value Date   TSH 3.490 02/25/2021    MMSE - Mini Mental State Exam 02/28/2018  Orientation to time 5  Orientation to Place 5  Registration 3  Attention/ Calculation 0  Recall 3  Language- name 2 objects 0  Language- repeat 1  Language- follow 3 step command 3  Language- read & follow direction 0  Write a sentence 0  Copy design 0  Total score 20     No flowsheet data found.   ASSESSMENT AND PLAN  50 y.o. year old male  has a past medical history of Anxiety, Bipolar 2 disorder ([place]) (  03/2009), Chronic back pain, Chronic pain syndrome, DDD (degenerative disc disease), Degenerative disk disease, Depression, Fracture, mandible ([place]), Fracture, tibia, Hyperlipidemia, Hypothyroidism (05/2007), and PTSD (post-traumatic stress disorder). here with    No diagnosis found.   No orders of the defined types were placed in this encounter.    No orders of the defined types were placed in this encounter.     Debbora Presto, MSN, FNP-C 03/10/2021, 7:08 AM  [facility] [address], [place], [place] [address] [phone]

## 2021-03-13 ENCOUNTER — Encounter: Payer: Self-pay | Admitting: Family Medicine

## 2021-03-14 ENCOUNTER — Encounter: Payer: Self-pay | Admitting: Family Medicine

## 2021-03-14 ENCOUNTER — Other Ambulatory Visit: Payer: Self-pay | Admitting: Family Medicine

## 2021-03-14 ENCOUNTER — Other Ambulatory Visit: Payer: Self-pay | Admitting: Urology

## 2021-03-14 DIAGNOSIS — E349 Endocrine disorder, unspecified: Secondary | ICD-10-CM

## 2021-03-15 ENCOUNTER — Ambulatory Visit: Payer: BC Managed Care – PPO

## 2021-03-22 DIAGNOSIS — F1421 Cocaine dependence, in remission: Secondary | ICD-10-CM | POA: Diagnosis not present

## 2021-03-22 DIAGNOSIS — F5105 Insomnia due to other mental disorder: Secondary | ICD-10-CM | POA: Diagnosis not present

## 2021-03-22 DIAGNOSIS — F3131 Bipolar disorder, current episode depressed, mild: Secondary | ICD-10-CM | POA: Diagnosis not present

## 2021-03-22 DIAGNOSIS — F411 Generalized anxiety disorder: Secondary | ICD-10-CM | POA: Diagnosis not present

## 2021-03-31 ENCOUNTER — Other Ambulatory Visit: Payer: Self-pay | Admitting: Family Medicine

## 2021-04-07 ENCOUNTER — Other Ambulatory Visit: Payer: Self-pay

## 2021-04-13 ENCOUNTER — Other Ambulatory Visit: Payer: Self-pay | Admitting: Urology

## 2021-04-13 DIAGNOSIS — E349 Endocrine disorder, unspecified: Secondary | ICD-10-CM

## 2021-04-17 DIAGNOSIS — F410 Panic disorder [episodic paroxysmal anxiety] without agoraphobia: Secondary | ICD-10-CM | POA: Diagnosis not present

## 2021-04-17 DIAGNOSIS — F1421 Cocaine dependence, in remission: Secondary | ICD-10-CM | POA: Diagnosis not present

## 2021-04-17 DIAGNOSIS — F609 Personality disorder, unspecified: Secondary | ICD-10-CM | POA: Diagnosis not present

## 2021-04-17 DIAGNOSIS — F3132 Bipolar disorder, current episode depressed, moderate: Secondary | ICD-10-CM | POA: Diagnosis not present

## 2021-04-18 DIAGNOSIS — F1320 Sedative, hypnotic or anxiolytic dependence, uncomplicated: Secondary | ICD-10-CM | POA: Diagnosis not present

## 2021-04-18 DIAGNOSIS — Z5181 Encounter for therapeutic drug level monitoring: Secondary | ICD-10-CM | POA: Diagnosis not present

## 2021-04-21 ENCOUNTER — Other Ambulatory Visit: Payer: Medicare Other

## 2021-04-24 ENCOUNTER — Other Ambulatory Visit: Payer: Medicare Other

## 2021-05-01 ENCOUNTER — Other Ambulatory Visit: Payer: Self-pay

## 2021-05-01 ENCOUNTER — Other Ambulatory Visit: Payer: Medicare Other

## 2021-05-01 DIAGNOSIS — N400 Enlarged prostate without lower urinary tract symptoms: Secondary | ICD-10-CM | POA: Diagnosis not present

## 2021-05-01 DIAGNOSIS — E291 Testicular hypofunction: Secondary | ICD-10-CM

## 2021-05-02 LAB — PSA: Prostate Specific Ag, Serum: 2.2 ng/mL (ref 0.0–4.0)

## 2021-05-05 DIAGNOSIS — F603 Borderline personality disorder: Secondary | ICD-10-CM | POA: Diagnosis not present

## 2021-05-05 DIAGNOSIS — F411 Generalized anxiety disorder: Secondary | ICD-10-CM | POA: Diagnosis not present

## 2021-05-05 DIAGNOSIS — F332 Major depressive disorder, recurrent severe without psychotic features: Secondary | ICD-10-CM | POA: Diagnosis not present

## 2021-05-06 ENCOUNTER — Other Ambulatory Visit: Payer: Self-pay | Admitting: Family Medicine

## 2021-05-13 ENCOUNTER — Other Ambulatory Visit: Payer: Self-pay | Admitting: Urology

## 2021-05-13 DIAGNOSIS — E349 Endocrine disorder, unspecified: Secondary | ICD-10-CM

## 2021-05-27 DIAGNOSIS — Z451 Encounter for adjustment and management of infusion pump: Secondary | ICD-10-CM | POA: Diagnosis not present

## 2021-05-27 DIAGNOSIS — M545 Low back pain, unspecified: Secondary | ICD-10-CM | POA: Diagnosis not present

## 2021-05-27 DIAGNOSIS — G8929 Other chronic pain: Secondary | ICD-10-CM | POA: Diagnosis not present

## 2021-05-28 NOTE — Progress Notes (Signed)
Chief Complaint  Patient presents with   Follow-up    Rm 1, alone. Here for 5 month migraine f/u. Pt reports Ajovy works some months vs other. Pt reports weight loss between his spinal problems, depression, and migraines.      HISTORY OF PRESENT ILLNESS:  05/29/21 ALL:  Cody [name] is a 50 y.o. male here today for follow up for migraines. He was started on Ajovy following consult with Dr Jaynee Eagles 11/2020. MRI was normal. He feels that some months are better than others. He has tolerated Ajovy but not sure it working as well as he would like. Last injection Migraines are always at night. He has about 7-8 per month. He usually vomits with migraines. He is followed by Dr Mechele Dawley with [place]. Dr Damita Dunnings follows for PCP needs. He is seen by psychiatry, unsure of name as he was recently switched to new provider. He is considering ketamine treatments. Sumatriptan and phenergan prescribed by PCP helps with abortive therapy.    HISTORY (copied from Dr Cathren Laine previous note)  HPI:  Cody [name] is a 50 y.o. male here as requested by Tonia Ghent, MD for headache. PMHx already evaluated by neurology for headache and severe sleep apnea Dr. Rowan Blase clinic 2021, MRI and MRV ordered, PTSD, hypothyroidism, diagnosed with nonepileptic spells by Dr. Angela Nevin, diagnosed with severe sleep apnea by Dr. Manuella Ghazi May 2021 a CPAP machine was ordered for him, chronic pain on a morphine pump, hyperlipidemia, depression, degenerative disc disease, chronic back pain, bipolar 2, anxiety, cigarette smoker, obesity, chronic pain, headache.  I reviewed Dr. Elveria Rising notes: He seen on August 30, 2020 complaining of headache for about a week, at the base of the skull, radiates over the right more than left temple, right side of the head, he vomited from pain in the prior 24 hours, use cold compress, later in bed in a dark room, he was able to sleep some and the pain got better, but the headache continued frontal  right side, no known fever, stiff neck no speech or motor changes.  In April 2021 he had a normal MRI of the brain.  Per Dr. Elveria Rising evaluation, concerning for migraine, he was prescribed Imitrex and Phenergan as needed and referred to neurology.   Patient has been seen by neurology in the past, in January 2021 he was seen at the [facility] by Dr. Manuella Ghazi, for episodic confusion, more so in the morning with associated disequilibrium, history of bipolar disorder and substance abuse, chronic pain on morphine pump with some episodes of seizure-like activity likely nonepileptic spells.  Dr. Manuella Ghazi recommended EEG, home sleep study, neurocognitive testing, head CT October 2020 was unremarkable.  They ordered home sleep test, speech therapy for cognitive training, neuropsychology for neurocognitive testing, they checked B12, B1, folate, RPR, Lyme, ACE, ESR, CRP, ANA, IFE, rheumatoid arthritis factor.   Patient is here alone and reports: The first headache was in January of this year, "out of the blue", 2-3 times a week, 4-7/10 in pain, Imitrex helps and sp does promethazine. He has nausea and vomiting, worst headache of life so severe he was terrified, they are on the right side and on the top, pulsating and pounding and throbbing, excruciating, he may see sparkles in the eyes he also gets pain in the back of the neck and pressure, he takes imitrext at that point. The headaches can last 24-48 hours, with treatment under a day, photophobia/phonophobia, since January 1/2 the month  of migraines, he is worried, he can't drive with the migraines, no family history, acute onset, no medication overuse, does not always have an aura. He is trying to examine his lifestyle, stress, sleep and nothing has been discovered, they come after 12, black out helps, right eye water, not stabbing, he has pressure behind his eye.  He has numbness in the right side of the scalp and face with the headaches, but no focal weakness. No other  focal neurologic deficits, associated symptoms, inciting events or modifiable factors.   Reviewed notes, labs and imaging from outside physicians, which showed:   CMP with elevated glucose 06/13/2020 otherwise unremarkable   Celexa, lexapro, lithium, prednisone, propranolol, seroquel, imitrex, lamictal, amitriptyline, topamax with lamictal contraindicated   MRI brain 11/23/2019: FINDINGS: Brain: There is no acute infarction or intracranial hemorrhage. There is no intracranial mass, mass effect, or edema. There is no hydrocephalus or extra-axial fluid collection. Ventricles and sulci are normal in size and configuration. No abnormal enhancement.   Vascular: Major vessel flow voids at the skull base are preserved.   Skull and upper cervical spine: Normal marrow signal is preserved.   Sinuses/Orbits: Paranasal sinuses are aerated. Orbits are unremarkable.   Other: Sella is unremarkable.  Mastoid air cells are clear.   IMPRESSION: Normal MRI of the brain.   REVIEW OF SYSTEMS: Out of a complete 14 system review of symptoms, the patient complains only of the following symptoms, headaches, chronic pain, anxiety, depression and all other reviewed systems are negative.   ALLERGIES: Allergies  Allergen Reactions   Neurontin [Gabapentin]     lethargic   Vancomycin     Swelling    Doxycycline     vomiting   Lithium     tremor   Mirtazapine     REACTION: lethargy ( Remeron)     HOME MEDICATIONS: Outpatient Medications Prior to Visit  Medication Sig Dispense Refill   AMBULATORY NON FORMULARY MEDICATION Morphine Pump Implant     atorvastatin (LIPITOR) 10 MG tablet Take 1 tablet by mouth at bedtime.     busPIRone (BUSPAR) 10 MG tablet Take 1 tablet (10 mg total) by mouth 2 (two) times daily.     Cholecalciferol (VITAMIN D) 50 MCG (2000 UT) CAPS Take 1 capsule by mouth daily.     escitalopram (LEXAPRO) 20 MG tablet Take 1 tablet (20 mg total) by mouth daily.     HYDROmorphone  (DILAUDID) 2 MG tablet Take 2 mg by mouth 4 (four) times daily as needed.     hydrOXYzine (ATARAX/VISTARIL) 50 MG tablet Take 50 mg by mouth 2 (two) times daily.     lamoTRIgine (LAMICTAL) 200 MG tablet Take 1 tablet (200 mg total) by mouth 2 (two) times daily.     levothyroxine (SYNTHROID) 75 MCG tablet TAKE 1 TABLET BY MOUTH  DAILY BEFORE BREAKFAST . 90 tablet 3   Omega-3 1000 MG CAPS Take 2 each by mouth daily.     OVER THE COUNTER MEDICATION      polyethylene glycol powder (GLYCOLAX/MIRALAX) 17 GM/SCOOP powder Take 17 g by mouth every other day as needed.     promethazine (PHENERGAN) 25 MG tablet TAKE 1/2 TO 1 TABLET BY MOUTH EVERY 8 HOURS AS NEEDED FOR NAUSEA AND VOMITING 20 tablet 1   SUMAtriptan (IMITREX) 100 MG tablet TAKE 1 TABLET (100 MG TOTAL) BY MOUTH EVERY 2 (TWO) HOURS AS NEEDED FOR MIGRAINE (MAX 2 DOSES IN 24 HOURS.). MAY REPEAT IN 2 HOURS IF HEADACHE PERSISTS OR RECURS.  20 tablet 4   testosterone cypionate (DEPOTESTOSTERONE CYPIONATE) 200 MG/ML injection INJECT 1 ML (200 MG TOTAL) INTO THE MUSCLE EVERY 14 DAYS 2 mL 0   tiZANidine (ZANAFLEX) 4 MG tablet Take 4 mg by mouth 3 (three) times daily.     AJOVY 225 MG/1.5ML SOAJ INJECT 225 MG INTO THE SKIN EVERY 30 (THIRTY) DAYS. 1.5 mL 11   oxazepam (SERAX) 10 MG capsule Take 20 mg by mouth at bedtime as needed for sleep or anxiety.     ziprasidone (GEODON) 60 MG capsule Take 60 mg by mouth daily.     No facility-administered medications prior to visit.     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; herniated disc L5- S1   Degenerative disk disease    Depression    Fracture, mandible ([facility])    Fracture, tibia    Hyperlipidemia    Hypothyroidism 05/2007   Dr Unice Cobble  (retired)/ Dr Elsie Stain   PTSD (post-traumatic stress disorder)    suicide attempt X1, gestureX 1     PAST SURGICAL HISTORY: Past Surgical History:  Procedure Laterality Date   APPENDECTOMY     peritonitis   INTRATHECAL PUMP IMPLANTATION  2007   LAMINECTOMY  2004   arterial injury, transfused 7 units pc, implant surgery    MANDIBLE FRACTURE SURGERY     mugged   THORACIC DISCECTOMY     T10   UPPER GASTROINTESTINAL ENDOSCOPY  2006   gastritis     FAMILY HISTORY: Family History  Problem Relation Age of Onset   Ulcers Mother    Depression Mother    Thyroid disease Mother        hypo   Stroke Father 37   Thyroid disease Father        hypo   Lung cancer Maternal Grandmother    Thyroid disease Maternal Grandmother        hypo   Colon cancer Maternal Grandfather    Heart attack Maternal Grandfather 55   Esophageal cancer Maternal Grandfather    Diabetes Neg Hx    Prostate cancer Neg Hx    Kidney cancer Neg Hx    Bladder Cancer Neg Hx    Migraines Neg Hx    Headache Neg Hx      SOCIAL HISTORY: Social History   Socioeconomic History   Marital status: Married    Spouse name: Not on file   Number of children: 0   Years of education: Not on file   Highest education level: Not on file  Occupational History   Occupation: Disabled  Tobacco Use   Smoking status: Former    Packs/day: 0.25    Types: E-cigarettes, Cigarettes    Quit date: 08/10/2009    Years since quitting: 11.8   Smokeless tobacco: Never   Tobacco comments:    smoked age intermittently age [age]; up to 2 cigarettes/ day   Vaping Use   Vaping Use: Never used  Substance and Sexual Activity   Alcohol use: No   Drug use: No   Sexual activity: Not on file  Other Topics Concern   Not on file  Social History Narrative   From  [place]   To [place] 2000   Disability from bipolar affective disorder, prev accounting.     Married 1997   No kids   Enjoys walking, travel.      Social Determinants of Health   Financial Resource Strain: Not on file  Food Insecurity: Not on file  Transportation Needs: Not on file  Physical Activity: Not on file  Stress: Not on file  Social Connections: Not on file  Intimate Partner Violence: Not on file     PHYSICAL EXAM  Vitals:   05/29/21 0937  BP: 119/89  Pulse: 76  Weight: 192 lb 8 oz (87.3 kg)  Height: 5' 5"  (1.651 m)   Body mass index is 32.03 kg/m.  Generalized: Well developed, in no acute distress  Cardiology: normal rate and rhythm, no murmur auscultated  Respiratory: clear to auscultation bilaterally    Neurological examination  Mentation: Alert oriented to time, place, history taking. Follows all commands speech and language fluent Cranial nerve II-XII: Pupils were equal round reactive to light. Extraocular movements were full, visual field were full on confrontational test. Facial sensation and strength were normal. Head turning and shoulder shrug  were normal and symmetric. Motor: The motor testing reveals 5 over 5 strength of all 4 extremities. Good symmetric motor tone is noted throughout.  Gait and station: Gait is normal.    DIAGNOSTIC DATA (LABS, IMAGING, TESTING) - I reviewed patient records, labs, notes, testing and imaging myself where available.  Lab Results  Component Value Date   WBC 10.0 02/25/2021   HGB 15.8 02/25/2021   HCT 48.5 02/25/2021   MCV 84 02/25/2021   PLT 290 02/25/2021      Component Value Date/Time   NA 141 03/03/2021 1256   NA 142 03/23/2014 1752   K 4.7 03/03/2021 1256   K 3.9 03/23/2014 1752   CL 99 03/03/2021 1256   CL 103 03/23/2014 1752   CO2 24 03/03/2021 1256   CO2 28 03/23/2014 1752   GLUCOSE 118 (H) 03/03/2021 1256   GLUCOSE 95 05/16/2019 1542   GLUCOSE 166 (H) 03/23/2014 1752   BUN 8 03/03/2021 1256   BUN 14 03/23/2014 1752   CREATININE 1.28 (H) 03/03/2021 1256   CREATININE 1.14 12/09/2016 0000   CREATININE 1.33 (H) 03/23/2014 1752   CALCIUM  9.4 03/03/2021 1256   CALCIUM 9.2 03/23/2014 1752   PROT 6.9 02/25/2021 0837   PROT 8.3 (H) 03/23/2014 1752   ALBUMIN 4.6 02/25/2021 0837   ALBUMIN 4.2 03/23/2014 1752   AST 24 02/25/2021 0837   AST 22 12/09/2016 0000   ALT 17 02/25/2021 0837   ALT 22 12/09/2016 0000   ALT 84 (H) 03/23/2014 1752   ALKPHOS 104 02/25/2021 0837   ALKPHOS 76 03/23/2014 1752   BILITOT <0.2 02/25/2021 0837   BILITOT 0.5 03/23/2014 1752   GFRNONAA 101 06/13/2020 1347   GFRNONAA >60 03/23/2014 1752   GFRAA 117 06/13/2020 1347   GFRAA >60 03/23/2014 1752   Lab Results  Component Value Date   CHOL 124 02/25/2021   HDL 30 (L) 02/25/2021   LDLCALC 71 02/25/2021   LDLDIRECT 176.4 10/11/2012   TRIG 126 02/25/2021   CHOLHDL 4.1 02/25/2021   Lab Results  Component Value Date   HGBA1C 6.1 (H) 02/25/2021   No results found for: [id number] Lab Results  Component Value Date   TSH 3.490 02/25/2021    MMSE - Mini Mental State Exam 02/28/2018  Orientation to  time 5  Orientation to Place 5  Registration 3  Attention/ Calculation 0  Recall 3  Language- name 2 objects 0  Language- repeat 1  Language- follow 3 step command 3  Language- read & follow direction 0  Write a sentence 0  Copy design 0  Total score 20     No flowsheet data found.   ASSESSMENT AND PLAN  50 y.o. year old male  has a past medical history of Anxiety, Bipolar 2 disorder ([place]) (03/2009), Chronic back pain, Chronic pain syndrome, DDD (degenerative disc disease), Degenerative disk disease, Depression, Fracture, mandible ([place]), Fracture, tibia, Hyperlipidemia, Hypothyroidism (05/2007), and PTSD (post-traumatic stress disorder). here with    Chronic migraine with aura  Tim has tolerated Ajovy but unsure how effective it has been. We will switch him to [employer]. Sample provided today. He will take loading dose today and fill maintenance  at pharmacy. Avoid Amovig due to possible constipation in setting of chronic pain management.  He will continue sumatriptan and phenergan as prescribed by PCP. He will continue close follow up with PCP, pain management and psychiatry. Follow up with me in 4-5 months.    No orders of the defined types were placed in this encounter.    Meds ordered this encounter  Medications   Galcanezumab-gnlm (EMGALITY) 120 MG/ML SOAJ    Sig: Inject 120 mg into the skin every 30 (thirty) days.    Dispense:  3 mL    Refill:  3    Order Specific Question:   Supervising Provider    Answer:   Melvenia Beam [[id number]]   Galcanezumab-gnlm (EMGALITY) 120 MG/ML SOAJ    Sig: Inject 120 mg into the skin every 30 (thirty) days.    Dispense:  2 mL    Refill:  0    Order Specific Question:   Supervising Provider    Answer:   Melvenia Beam [[id number]]       Debbora Presto, MSN, FNP-C 05/29/2021, 10:09 AM  [facility] [address] [place], [place] [address] [phone]

## 2021-05-28 NOTE — Patient Instructions (Addendum)
Below is our plan:  We will switch you to [employer]. Take two injections today with samples provided. Go online to [email or web address] to download copay card and take to pharmacy. Continue sumatriptan for abortive therapy.  Please make sure you are staying well hydrated. I recommend 50-60 ounces daily. Well balanced diet and regular exercise encouraged. Consistent sleep schedule with 6-8 hours recommended.   Please continue follow up with care team as directed.   Follow up with me in 4-5 months   You may receive a survey regarding today's visit. I encourage you to leave honest feed back as I do use this information to improve patient care. Thank you for seeing me today!

## 2021-05-29 ENCOUNTER — Ambulatory Visit (INDEPENDENT_AMBULATORY_CARE_PROVIDER_SITE_OTHER): Payer: BC Managed Care – PPO | Admitting: Family Medicine

## 2021-05-29 ENCOUNTER — Encounter: Payer: Self-pay | Admitting: Family Medicine

## 2021-05-29 VITALS — BP 119/89 | HR 76 | Ht 65.0 in | Wt 192.5 lb

## 2021-05-29 DIAGNOSIS — G43109 Migraine with aura, not intractable, without status migrainosus: Secondary | ICD-10-CM | POA: Diagnosis not present

## 2021-05-29 MED ORDER — EMGALITY 120 MG/ML ~~LOC~~ SOAJ: 120.0000 mg | SUBCUTANEOUS | 3 refills | Status: DC

## 2021-05-29 MED ORDER — EMGALITY 120 MG/ML ~~LOC~~ SOAJ: 120.0000 mg | SUBCUTANEOUS | 0 refills | Status: DC

## 2021-06-07 ENCOUNTER — Other Ambulatory Visit: Payer: Self-pay | Admitting: Urology

## 2021-06-07 DIAGNOSIS — E349 Endocrine disorder, unspecified: Secondary | ICD-10-CM

## 2021-06-29 ENCOUNTER — Other Ambulatory Visit: Payer: Self-pay | Admitting: Family Medicine

## 2021-07-01 ENCOUNTER — Other Ambulatory Visit: Payer: Self-pay

## 2021-07-01 ENCOUNTER — Other Ambulatory Visit: Payer: Medicare Other

## 2021-07-01 ENCOUNTER — Ambulatory Visit: Payer: Medicare Other | Admitting: Family Medicine

## 2021-07-01 DIAGNOSIS — N400 Enlarged prostate without lower urinary tract symptoms: Secondary | ICD-10-CM | POA: Diagnosis not present

## 2021-07-01 DIAGNOSIS — E349 Endocrine disorder, unspecified: Secondary | ICD-10-CM | POA: Diagnosis not present

## 2021-07-02 DIAGNOSIS — M5417 Radiculopathy, lumbosacral region: Secondary | ICD-10-CM | POA: Diagnosis not present

## 2021-07-02 DIAGNOSIS — Z79899 Other long term (current) drug therapy: Secondary | ICD-10-CM | POA: Diagnosis not present

## 2021-07-02 DIAGNOSIS — G894 Chronic pain syndrome: Secondary | ICD-10-CM | POA: Diagnosis not present

## 2021-07-02 DIAGNOSIS — F419 Anxiety disorder, unspecified: Secondary | ICD-10-CM | POA: Diagnosis not present

## 2021-07-02 DIAGNOSIS — Z5181 Encounter for therapeutic drug level monitoring: Secondary | ICD-10-CM | POA: Diagnosis not present

## 2021-07-02 LAB — PSA: Prostate Specific Ag, Serum: 2.2 ng/mL (ref 0.0–4.0)

## 2021-07-02 LAB — HEMOGLOBIN: Hemoglobin: 17.4 g/dL (ref 13.0–17.7)

## 2021-07-02 LAB — TESTOSTERONE: Testosterone: 1446 ng/dL — ABNORMAL HIGH (ref 264–916)

## 2021-07-02 LAB — HEMATOCRIT: Hematocrit: 54 % — ABNORMAL HIGH (ref 37.5–51.0)

## 2021-07-07 NOTE — Progress Notes (Deleted)
07/08/2021 12:36 PM   Cody [name] 06-17-1971 [id number]  Referring provider: Joaquim Nam, MD [address] [place],  [place] [address]  No chief complaint on file.  Urological history: 1. Testosterone deficiency -testosterone level 1446 in 06/2021 -HCT 54.0% in 06/2021 -managed with testosterone cypionate 200 mg/cc, 1 cc every 14 days ***  2. BPH with LU TS -PSA 2.2 in 06/2021 -I PSS ***  3. ED -contributing factors of age, testosterone deficiency, BPH, chronic pain medications, smoking, HLD, HTN, depression, anxiety and hypothyroidism -SHIM ***   HPI: Cody [name] is a 50 y.o. male who presents today to for follow up.       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; herniated disc L5- S1   Degenerative disk disease    Depression    Fracture, mandible ([facility])    Fracture, tibia    Hyperlipidemia    Hypothyroidism 05/2007   Dr Marga Melnick (retired)/ Dr Crawford Givens   PTSD (post-traumatic stress disorder)    suicide attempt X1, gestureX 1    Surgical History: Past Surgical History:  Procedure Laterality Date   APPENDECTOMY     peritonitis   INTRATHECAL PUMP IMPLANTATION  2007   LAMINECTOMY  2004   arterial injury, transfused 7 units pc, implant surgery    MANDIBLE FRACTURE SURGERY     mugged   THORACIC DISCECTOMY     T10   UPPER GASTROINTESTINAL ENDOSCOPY  2006   gastritis    Home Medications:  Allergies as of 07/08/2021       Reactions   Neurontin [gabapentin]    lethargic   Vancomycin    Swelling    Doxycycline    vomiting   Lithium     tremor   Mirtazapine    REACTION: lethargy ( Remeron)        Medication List        Accurate as of July 07, 2021 12:36 PM. If you have any questions, ask your nurse or doctor.          AMBULATORY NON FORMULARY MEDICATION Morphine Pump Implant   atorvastatin 10 MG tablet Commonly known as: LIPITOR Take 1 tablet by mouth at bedtime.   busPIRone 10 MG tablet Commonly known as: BUSPAR Take 1 tablet (10 mg total) by mouth 2 (two) times daily.   Emgality 120 MG/ML Soaj Generic drug: Galcanezumab-gnlm Inject 120 mg into the skin every 30 (thirty) days.   Emgality 120 MG/ML Soaj Generic drug: Galcanezumab-gnlm Inject 120 mg into the skin every 30 (thirty) days.   escitalopram 20 MG tablet Commonly known as: Lexapro Take 1 tablet (20 mg total) by mouth daily.   HYDROmorphone 2 MG tablet Commonly known as: DILAUDID Take 2 mg by mouth 4 (four) times daily as needed.   hydrOXYzine 50 MG tablet Commonly known as: ATARAX/VISTARIL Take 50 mg by mouth 2 (two) times daily.   lamoTRIgine 200 MG tablet Commonly known as: LAMICTAL Take 1 tablet (200 mg  total) by mouth 2 (two) times daily.   levothyroxine 75 MCG tablet Commonly known as: SYNTHROID TAKE 1 TABLET BY MOUTH  DAILY BEFORE BREAKFAST   Omega-3 1000 MG Caps Take 2 each by mouth daily.   OVER THE COUNTER MEDICATION   polyethylene glycol powder 17 GM/SCOOP powder Commonly known as: GLYCOLAX/MIRALAX Take 17 g by mouth every other day as needed.   promethazine 25 MG tablet Commonly known as: PHENERGAN TAKE 1/2 TO 1 TABLET BY MOUTH EVERY 8 HOURS AS NEEDED FOR NAUSEA AND VOMITING   SUMAtriptan 100 MG tablet Commonly known as: IMITREX TAKE 1 TABLET (100 MG TOTAL) BY MOUTH EVERY 2 (TWO) HOURS AS NEEDED FOR MIGRAINE (MAX 2 DOSES IN 24 HOURS.). MAY REPEAT IN 2 HOURS IF HEADACHE PERSISTS OR RECURS.   testosterone cypionate 200 MG/ML injection Commonly known as: DEPOTESTOSTERONE CYPIONATE INJECT 1 ML (200 MG  TOTAL) INTO THE MUSCLE EVERY 14 DAYS   tiZANidine 4 MG tablet Commonly known as: ZANAFLEX Take 4 mg by mouth 3 (three) times daily.   Vitamin D 50 MCG (2000 UT) Caps Take 1 capsule by mouth daily.        Allergies:  Allergies  Allergen Reactions   Neurontin [Gabapentin]     lethargic   Vancomycin     Swelling    Doxycycline     vomiting   Lithium     tremor   Mirtazapine     REACTION: lethargy ( Remeron)    Family History: Family History  Problem Relation Age of Onset   Ulcers Mother    Depression Mother    Thyroid disease Mother        hypo   Stroke Father 22   Thyroid disease Father        hypo   Lung cancer Maternal Grandmother    Thyroid disease Maternal Grandmother        hypo   Colon cancer Maternal Grandfather    Heart attack Maternal Grandfather 55   Esophageal cancer Maternal Grandfather    Diabetes Neg Hx    Prostate cancer Neg Hx    Kidney cancer Neg Hx    Bladder Cancer Neg Hx    Migraines Neg Hx    Headache Neg Hx     Social History:  reports that he quit smoking about 11 years ago. His smoking use included e-cigarettes and cigarettes. He smoked an average of .25 packs per day. He has never used smokeless tobacco. He reports that he does not drink alcohol and does not use drugs.  ROS: Pertinent ROS in HPI  Physical Exam: There were no vitals taken for this visit.  Constitutional:  Well nourished. Alert and oriented, No acute distress. HEENT: [place] AT, moist mucus membranes.  Trachea midline Cardiovascular: No clubbing, cyanosis, or edema. Respiratory: Normal respiratory effort, no increased work of breathing. GI: Abdomen is soft, non tender, non distended, no abdominal masses. Liver and spleen not palpable.  No hernias appreciated.  Stool sample for occult testing is not indicated.   GU: No CVA tenderness.  No bladder fullness or masses.  Patient with circumcised/uncircumcised phallus. ***Foreskin easily retracted***  Urethral meatus is  patent.  No penile discharge. No penile lesions or rashes. Scrotum without lesions, cysts, rashes and/or edema.  Testicles are located scrotally bilaterally. No masses are appreciated in the testicles. Left and right epididymis are normal. Rectal: Patient with  normal sphincter tone. Anus and perineum without scarring or rashes. No rectal masses are appreciated. Prostate is approximately ***  grams, *** nodules are appreciated. Seminal vesicles are normal. Skin: No rashes, bruises or suspicious lesions. Lymph: No inguinal adenopathy. Neurologic: Grossly intact, no focal deficits, moving all 4 extremities. Psychiatric: Normal mood and affect.   Laboratory Data: Component     Latest Ref Rng & Units 12/24/2020 05/01/2021 07/01/2021  Prostate Specific Ag, Serum     0.0 - 4.0 ng/mL 2.9 2.2 2.2   Lab Results  Component Value Date   WBC 10.0 02/25/2021   HGB 17.4 07/01/2021   HCT 54.0 (H) 07/01/2021   MCV 84 02/25/2021   PLT 290 02/25/2021    Lab Results  Component Value Date   CREATININE 1.28 (H) 03/03/2021    Lab Results  Component Value Date   TESTOSTERONE 1,446 (H) 07/01/2021    Lab Results  Component Value Date   HGBA1C 6.1 (H) 02/25/2021    Lab Results  Component Value Date   TSH 3.490 02/25/2021       Component Value Date/Time   CHOL 124 02/25/2021 0837   CHOL 158 12/09/2016 0000   HDL 30 (L) 02/25/2021 0837   HDL 31 12/09/2016 0000   CHOLHDL 4.1 02/25/2021 0837   CHOLHDL 7 10/11/2012 1454   VLDL 26.4 10/11/2012 1454   LDLCALC 71 02/25/2021 0837   LDLCALC 96 12/09/2016 0000    Lab Results  Component Value Date   AST 24 02/25/2021   Lab Results  Component Value Date   ALT 17 02/25/2021     I have reviewed the labs.   Pertinent Imaging: No recent imaging  Assessment & Plan:    1. Testosterone deficiency -Patient's HCT is 54% or greater.  We will need to discontinue the testosterone therapy for 2 months and recheck the HBG/HCT and testosterone  levels.  If the HBG/HCT normalize, we will need to restart the testosterone therapy at a reduced dose.  If the HBG/HCT does not normalize or continues to increase even at a reduced dose, patient we need to evaluated for sleep apnea and/or hypoxia and testosterone therapy discontinued.     2. BPH with LUTS -PSA stable -DRE benign -UA benign -PVR < 300 cc -symptoms - *** -most bothersome symptoms are *** -continue conservative management, avoiding bladder irritants and timed voiding's -Initiate alpha-blocker (***), discussed side effects *** -Initiate 5 alpha reductase inhibitor (***), discussed side effects *** -Continue tamsulosin 0.4 mg daily, alfuzosin 10 mg daily, Rapaflo 8 mg daily, terazosin, doxazosin, Cialis 5 mg daily and finasteride 5 mg daily, dutasteride 0.5 mg daily***:refills given -Cannot tolerate medication or medication failure, schedule cystoscopy ***   3. ED ***  No follow-ups on file.  These notes generated with voice recognition software. I apologize for typographical errors.  Michiel Cowboy, PA-C  [facility] [address]  [address] [place], [place] [address] [phone]

## 2021-07-08 ENCOUNTER — Ambulatory Visit: Payer: Self-pay | Admitting: Urology

## 2021-07-10 NOTE — Progress Notes (Signed)
07/11/2021 10:53 AM   Cody [name] Apr 05, 1971 [id number]  Referring provider: Tonia Ghent, MD [address] [place],  [place] [address]  Chief Complaint  Patient presents with   Follow-up    66mth follow-up Hypogonadism    Urological history: 1. Testosterone deficiency -testosterone level 1446 in 06/2021 -HCT 54.0% in 06/2021 -managed with testosterone cypionate 200 mg/cc, 1 cc every 14 days   2. BPH with LU TS -PSA 2.2 in 06/2021 -I PSS 14/2  3. ED -contributing factors of age, testosterone deficiency, BPH, chronic pain medications, smoking, HLD, HTN, depression, anxiety and hypothyroidism -SHIM 15   HPI: Cody [name] is a 50 y.o. male who presents today to for follow up.   He states he has been having dark colored urine off and on, but he hasn't had it recently.  He also has moments where he has to strain to urinate with urinary hesitancy, but these moments are rare.  Patient denies any modifying or aggravating factors.  Patient denies any gross hematuria, dysuria or suprapubic/flank pain.  Patient denies any fevers, chills, nausea or vomiting.      IPSS     Row Name 07/11/21 1000         International Prostate Symptom Score   How often have you had the sensation of not emptying your bladder? Less than half the time     How often have you had to urinate less than every two hours? Less than 1 in 5 times     How often have you found you stopped and started again several times when you urinated? Less than half the time     How often have you found it difficult to postpone urination? Less than 1 in 5 times     How often have you had a weak urinary stream? Less than half the time     How often have you had to strain to start urination? More than half the time     How many times did you typically get up at night to urinate? 2 Times     Total IPSS Score 14       Quality of Life due to urinary symptoms   If you were to spend the rest of your life with  your urinary condition just the way it is now how would you feel about that? Mostly Satisfied               Score:  1-7 Mild 8-19 Moderate 20-35 Severe  Patient still having spontaneous erections.  He denies any pain or curvature with erections.     SHIM     Row Name 07/11/21 1003         SHIM: Over the last 6 months:   How do you rate your confidence that you could get and keep an erection? Low     When you had erections with sexual stimulation, how often were your erections hard enough for penetration (entering your partner)? A Few Times (much less than half the time)     During sexual intercourse, how often were you able to maintain your erection after you had penetrated (entered) your partner? Sometimes (about half the time)     During sexual intercourse, how difficult was it to maintain your erection to completion of intercourse? Not Difficult     When you attempted sexual intercourse, how often was it satisfactory for you? 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; herniated disc L5- S1   Degenerative disk disease    Depression    Fracture, mandible ([place])    Fracture, tibia    Hyperlipidemia    Hypothyroidism 05/2007   Dr Unice Cobble (retired)/ Dr Elsie Stain   PTSD (post-traumatic stress disorder)    suicide attempt X1, gestureX 1    Surgical History: Past Surgical History:  Procedure Laterality Date   APPENDECTOMY     peritonitis   INTRATHECAL PUMP IMPLANTATION  2007   LAMINECTOMY  2004   arterial injury, transfused 7 units pc, implant  surgery    MANDIBLE FRACTURE SURGERY     mugged   THORACIC DISCECTOMY     T10   UPPER GASTROINTESTINAL ENDOSCOPY  2006   gastritis    Home Medications:  Allergies as of 07/11/2021       Reactions   Neurontin [gabapentin]    lethargic   Vancomycin    Swelling    Doxycycline    vomiting   Lithium    tremor   Mirtazapine    REACTION: lethargy ( Remeron)        Medication List        Accurate as of July 11, 2021 10:53 AM. If you have any questions, ask your nurse or doctor.          AMBULATORY NON FORMULARY MEDICATION Morphine Pump Implant   atorvastatin 10 MG tablet Commonly known as: LIPITOR Take 1 tablet by mouth at bedtime.   busPIRone 10 MG tablet Commonly known as: BUSPAR Take 1 tablet (10 mg total) by mouth 2 (two) times daily.   Emgality 120 MG/ML Soaj Generic drug: Galcanezumab-gnlm Inject 120 mg into the skin every 30 (thirty) days.   Emgality 120 MG/ML Soaj Generic drug: Galcanezumab-gnlm Inject 120 mg into the skin every 30 (thirty) days.   escitalopram 20 MG tablet Commonly known as: Lexapro Take 1 tablet (20 mg total) by mouth daily.   HYDROmorphone 2 MG tablet Commonly known as: DILAUDID Take 2 mg by mouth 4 (four) times daily as needed.   hydrOXYzine 50 MG tablet Commonly known as: ATARAX Take 50 mg by mouth 2 (two) times daily.   lamoTRIgine 200 MG tablet Commonly known as: LAMICTAL Take 1 tablet (200 mg total) by mouth 2 (two) times daily.   levothyroxine 75 MCG tablet Commonly known as: SYNTHROID TAKE 1 TABLET BY MOUTH  DAILY BEFORE BREAKFAST   Omega-3 1000 MG Caps Take 2 each by mouth daily.   OVER THE COUNTER MEDICATION   polyethylene glycol powder 17 GM/SCOOP powder Commonly known as: GLYCOLAX/MIRALAX Take 17 g by mouth every other day as needed.   promethazine 25 MG tablet Commonly known as: PHENERGAN TAKE 1/2 TO 1 TABLET BY MOUTH EVERY 8 HOURS AS NEEDED FOR NAUSEA AND VOMITING   SUMAtriptan 100 MG  tablet Commonly known as: IMITREX TAKE 1 TABLET (100 MG TOTAL) BY MOUTH EVERY 2 (  TWO) HOURS AS NEEDED FOR MIGRAINE (MAX 2 DOSES IN 24 HOURS.). MAY REPEAT IN 2 HOURS IF HEADACHE PERSISTS OR RECURS.   tadalafil 5 MG tablet Commonly known as: CIALIS Take 1 tablet (5 mg total) by mouth daily as needed. Started by: Zara Council, PA-C   testosterone cypionate 200 MG/ML injection Commonly known as: DEPOTESTOSTERONE CYPIONATE INJECT 1 ML (200 MG TOTAL) INTO THE MUSCLE EVERY 14 DAYS   tiZANidine 4 MG tablet Commonly known as: ZANAFLEX Take 4 mg by mouth 3 (three) times daily.   Vitamin D 50 MCG (2000 UT) Caps Take 1 capsule by mouth daily.        Allergies:  Allergies  Allergen Reactions   Neurontin [Gabapentin]     lethargic   Vancomycin     Swelling    Doxycycline     vomiting   Lithium     tremor   Mirtazapine     REACTION: lethargy ( Remeron)    Family History: Family History  Problem Relation Age of Onset   Ulcers Mother    Depression Mother    Thyroid disease Mother        hypo   Stroke Father 66   Thyroid disease Father        hypo   Lung cancer Maternal Grandmother    Thyroid disease Maternal Grandmother        hypo   Colon cancer Maternal Grandfather    Heart attack Maternal Grandfather 55   Esophageal cancer Maternal Grandfather    Diabetes Neg Hx    Prostate cancer Neg Hx    Kidney cancer Neg Hx    Bladder Cancer Neg Hx    Migraines Neg Hx    Headache Neg Hx     Social History:  reports that he quit smoking about 11 years ago. His smoking use included e-cigarettes and cigarettes. He smoked an average of .25 packs per day. He has never used smokeless tobacco. He reports that he does not drink alcohol and does not use drugs.  ROS: Pertinent ROS in HPI  Physical Exam: BP 124/90   Pulse (!) 111   Ht 5\' 5"  (1.651 m)   Wt 175 lb (79.4 kg)   BMI 29.12 kg/m   Constitutional:  Well nourished. Alert and oriented, No acute distress. HEENT: [place] AT,  mask in place.  Trachea midline Cardiovascular: No clubbing, cyanosis, or edema. Respiratory: Normal respiratory effort, no increased work of breathing. GU: No CVA tenderness.  No bladder fullness or masses.  Patient with circumcised phallus. Urethral meatus is patent.  No penile discharge. No penile lesions or rashes. Scrotum without lesions, cysts, rashes and/or edema.  Testicles are located scrotally bilaterally. No masses are appreciated in the testicles. Left and right epididymis are normal. Rectal: Patient with  normal sphincter tone. Anus and perineum without scarring or rashes. No rectal masses are appreciated. Prostate is approximately 50 grams, could only palpate apex and midportion of the gland, no nodules are appreciated. Seminal vesicles could not be palpated Neurologic: Grossly intact, no focal deficits, moving all 4 extremities. Psychiatric: Normal mood and affect.   Laboratory Data: Component     Latest Ref Rng & Units 12/24/2020 05/01/2021 07/01/2021  Prostate Specific Ag, Serum     0.0 - 4.0 ng/mL 2.9 2.2 2.2   Lab Results  Component Value Date   WBC 10.0 02/25/2021   HGB 17.4 07/01/2021   HCT 54.0 (H) 07/01/2021   MCV 84 02/25/2021   PLT 290 02/25/2021  Lab Results  Component Value Date   CREATININE 1.28 (H) 03/03/2021    Lab Results  Component Value Date   TESTOSTERONE 1,446 (H) 07/01/2021    Lab Results  Component Value Date   HGBA1C 6.1 (H) 02/25/2021    Lab Results  Component Value Date   TSH 3.490 02/25/2021       Component Value Date/Time   CHOL 124 02/25/2021 0837   CHOL 158 12/09/2016 0000   HDL 30 (L) 02/25/2021 0837   HDL 31 12/09/2016 0000   CHOLHDL 4.1 02/25/2021 0837   CHOLHDL 7 10/11/2012 1454   VLDL 26.4 10/11/2012 1454   LDLCALC 71 02/25/2021 0837   LDLCALC 96 12/09/2016 0000    Lab Results  Component Value Date   AST 24 02/25/2021   Lab Results  Component Value Date   ALT 17 02/25/2021  I have reviewed the  labs.   Pertinent Imaging: No recent imaging  Assessment & Plan:    1. Testosterone deficiency -Patient's HCT is 54% or greater.  We will need to discontinue the testosterone therapy for 2 months and recheck the HBG/HCT and testosterone levels.  If the HBG/HCT normalize, we will need to restart the testosterone therapy at a reduced dose.  If the HBG/HCT does not normalize or continues to increase even at a reduced dose, patient we need to evaluated for sleep apnea and/or hypoxia and testosterone therapy discontinued  2. BPH with LUTS -PSA stable -DRE benign -symptoms intermittent dark-colored urine, straining to urinate on rare occasion and hesitancy on rare occasions -continue conservative management, avoiding bladder irritants and timed voiding's -Start tadalafil 5 mg daily-prescription sent to pharmacy   3. ED -stable   4. Dark urine -Denies any frank hematuria -We will obtain urinalysis when he returns in 2 months for his blood work  Return in about 2 months (around 09/11/2021) for UA, testosterone, H & H .  These notes generated with voice recognition software. I apologize for typographical errors.  Michiel Cowboy, PA-C  [facility] [address]  [address] [place], [place] [address] [phone]

## 2021-07-11 ENCOUNTER — Other Ambulatory Visit: Payer: Self-pay

## 2021-07-11 ENCOUNTER — Ambulatory Visit (INDEPENDENT_AMBULATORY_CARE_PROVIDER_SITE_OTHER): Payer: BC Managed Care – PPO | Admitting: Urology

## 2021-07-11 ENCOUNTER — Encounter: Payer: Self-pay | Admitting: Urology

## 2021-07-11 VITALS — BP 124/90 | HR 111 | Ht 65.0 in | Wt 175.0 lb

## 2021-07-11 DIAGNOSIS — N400 Enlarged prostate without lower urinary tract symptoms: Secondary | ICD-10-CM

## 2021-07-11 DIAGNOSIS — E349 Endocrine disorder, unspecified: Secondary | ICD-10-CM

## 2021-07-11 DIAGNOSIS — R82998 Other abnormal findings in urine: Secondary | ICD-10-CM

## 2021-07-11 DIAGNOSIS — E230 Hypopituitarism: Secondary | ICD-10-CM | POA: Diagnosis not present

## 2021-07-11 MED ORDER — TADALAFIL 5 MG PO TABS: 5.0000 mg | ORAL_TABLET | Freq: Every day | ORAL | 3 refills | Status: DC | PRN

## 2021-07-11 NOTE — Addendum Note (Signed)
Addended by: Frankey Shown on: 07/11/2021 03:11 PM   Modules accepted: Orders

## 2021-07-21 ENCOUNTER — Other Ambulatory Visit: Payer: Self-pay | Admitting: Family Medicine

## 2021-07-21 ENCOUNTER — Telehealth: Payer: Self-pay | Admitting: *Deleted

## 2021-07-21 NOTE — Telephone Encounter (Signed)
Called and spoke to patient by telephone to offer him an earlier appointment with another provider. Patient stated that he is doing okay right now. Patient stated if his symptoms come back and he feels that he can not wait he will call the office back. Patient was given ER precautions and he verbalized understanding. Patient has an appointment scheduled with Dr. Para March 07/25/21 at 3:00 pm.

## 2021-07-21 NOTE — Telephone Encounter (Signed)
PLEASE NOTE: All timestamps contained within this report are represented as [place] Standard Time. CONFIDENTIALTY NOTICE: This fax transmission is intended only for the addressee. It contains information that is legally privileged, confidential or otherwise protected from use or disclosure. If you are not the intended recipient, you are strictly prohibited from reviewing, disclosing, copying using or disseminating any of this information or taking any action in reliance on or regarding this information. If you have received this fax in error, please notify us immediately by telephone so that we can arrange for its return to [place]. Phone: [phone], Toll-Free: [phone], Fax: [phone] Page: 1 of 2 Call Id: [id number] [facility] Day - Client TELEPHONE ADVICE RECORD AccessNurse Patient Name: Cody [name] NT Gender: Male DOB: 1971-01-25 Age: 50 Y 6 M 21 D Return Phone Number: [phone] (Primary), [phone] (Secondary) Address: [place] [address] Client [facility] Day - Client Client Site [facility] - Day Provider Raechel Ache - MD Contact Type Call Who Is Calling Patient / Member / Family / Caregiver Call Type Triage / Clinical Relationship To Patient Self Return Phone Number Please choose phone number Chief Complaint Vomiting Reason for Call Symptomatic / Request for Health Information Initial Comment Caller states his vomiting and diarrhea. its been going on for months. He said he's lost thirty pounds. Translation No Nurse Assessment Nurse: Lorin Picket, RN, Elnita Maxwell Date/Time (Eastern Time): 07/21/2021 2:41:50 PM Confirm and document reason for call. If symptomatic, describe symptoms. ---Caller states he has vomiting with diarrhea that has been ongoing for 1.5 months that comes and goes, has migraines and when he has them he vomits, has not vomited in a week, last bout of diarrhea was  a week ago, has lost 30 lbs in 2.5 months, has abd with vomiting and diarrhea only, denies abd pain, HA and other symptoms, drinking fluids and voiding Does the patient have any new or worsening symptoms? ---Yes Will a triage be completed? ---Yes Related visit to physician within the last 2 weeks? ---No Does the PT have any chronic conditions? (i.e. diabetes, asthma, this includes High risk factors for pregnancy, etc.) ---No Is this a behavioral health or substance abuse call? ---No Guidelines Guideline Title Affirmed Question Affirmed Notes Nurse Date/Time (Eastern Time) Vomiting [1] MILD vomiting with diarrhea AND [2] present > 5 days Wilford Grist 07/21/2021 2:49:48 PM Disp. Time Lamount Cohen Time) Disposition Final User 07/21/2021 2:52:26 PM SEE PCP WITHIN 3 DAYS Yes Lorin Picket, RN, Elnita Maxwell PLEASE NOTE: All timestamps contained within this report are represented as [place] Standard Time. CONFIDENTIALTY NOTICE: This fax transmission is intended only for the addressee. It contains information that is legally privileged, confidential or otherwise protected from use or disclosure. If you are not the intended recipient, you are strictly prohibited from reviewing, disclosing, copying using or disseminating any of this information or taking any action in reliance on or regarding this information. If you have received this fax in error, please notify us immediately by telephone so that we can arrange for its return to [place]. Phone: [phone], Toll-Free: [phone], Fax: [phone] Page: 2 of 2 Call Id: [id number] Caller Disagree/Comply Comply Caller Understands Yes PreDisposition Call Doctor Care Advice Given Per Guideline SEE PCP WITHIN 3 DAYS: * You need to be seen within 2 or 3 days. * Sip water or a 1/2 strength sports drink (e.g., Gatorade or Powerade). CALL BACK IF: * Vomit contains bile (green color) * Constant stomach pain lasting more than 2  hours * Signs of dehydration (e.g.,  no urination over 12 hours, very lightheaded) * You become worse Comments User: Dorris Fetch, RN Date/Time Lamount Cohen Time): 07/21/2021 2:53:40 PM Transferred to office and instructed to let them know he has spoken with Triage nurse and will need to be seen in next 2-3 days Referrals REFERRED TO PCP OFFICE

## 2021-07-21 NOTE — Telephone Encounter (Signed)
Noted. Thanks.

## 2021-07-23 ENCOUNTER — Other Ambulatory Visit: Payer: Self-pay

## 2021-07-23 ENCOUNTER — Encounter: Payer: Self-pay | Admitting: Family Medicine

## 2021-07-23 ENCOUNTER — Ambulatory Visit (INDEPENDENT_AMBULATORY_CARE_PROVIDER_SITE_OTHER): Payer: BC Managed Care – PPO | Admitting: Family Medicine

## 2021-07-23 VITALS — BP 100/64 | HR 74 | Temp 98.3°F | Ht 65.0 in | Wt 175.4 lb

## 2021-07-23 DIAGNOSIS — R197 Diarrhea, unspecified: Secondary | ICD-10-CM

## 2021-07-23 DIAGNOSIS — R112 Nausea with vomiting, unspecified: Secondary | ICD-10-CM

## 2021-07-23 DIAGNOSIS — Z114 Encounter for screening for human immunodeficiency virus [HIV]: Secondary | ICD-10-CM | POA: Diagnosis not present

## 2021-07-23 DIAGNOSIS — R739 Hyperglycemia, unspecified: Secondary | ICD-10-CM

## 2021-07-23 DIAGNOSIS — Z131 Encounter for screening for diabetes mellitus: Secondary | ICD-10-CM | POA: Diagnosis not present

## 2021-07-23 DIAGNOSIS — E348 Other specified endocrine disorders: Secondary | ICD-10-CM

## 2021-07-23 DIAGNOSIS — K529 Noninfective gastroenteritis and colitis, unspecified: Secondary | ICD-10-CM | POA: Diagnosis not present

## 2021-07-23 DIAGNOSIS — E038 Other specified hypothyroidism: Secondary | ICD-10-CM

## 2021-07-23 DIAGNOSIS — R634 Abnormal weight loss: Secondary | ICD-10-CM | POA: Diagnosis not present

## 2021-07-23 DIAGNOSIS — R7989 Other specified abnormal findings of blood chemistry: Secondary | ICD-10-CM | POA: Diagnosis not present

## 2021-07-23 NOTE — Patient Instructions (Signed)
m °

## 2021-07-23 NOTE — Progress Notes (Signed)
Dazia Lippold T. Juleah Paradise, MD, [facility] [place], [address]  Phone: [phone]   FAX: [place] - 50 y.o. male   MRN [id number]   Date of Birth: [birth year]  Date: 07/23/2021   PCP: Tonia Ghent, MD   Referral: Tonia Ghent, MD  Chief Complaint  Patient presents with   Emesis   Diarrhea    On & Off x 2 months    This visit occurred during the SARS-CoV-2 public health emergency.  Safety protocols were in place, including screening questions prior to the visit, additional usage of staff PPE, and extensive cleaning of exam room while observing appropriate contact time as indicated for disinfecting solutions.   Subjective:   Cody [name] is a 50 y.o. very pleasant male patient with Body mass index is 29.18 kg/m. who presents with the following:  Patient presents with some acute nausea, vomiting, diarrhea.  He has actually lost 30 pounds in a 2-1/12-monthtime span.  He is continually been having nausea, and he has diarrhea about 1 time a day  Dropped from 210 now to 175.  He has not been trying to lose weight at all.  Now having chronic nausea, vomitting, and diarrhea.  He has had some diarrhea in the past, but nothing like this. Has some phenergan and has will help with nausea.   On Synthroid.  Lab Results  Component Value Date   TSH 3.490 02/25/2021     Was having migraines a lot.  This is not I do think.  He has had chronic migraines off and on for a long time, and he will have intermittent exacerbations.  Endocrine dysfunction.  Hypergonadism.  His LH is decreased, he does have a normal FSH.  His estradiol is also lower than the lowest parameter. Prolactin is normal. Testosterone < 3.  No prior anabolic steroids. -He was recently started on some Depo testosterone, but his recent Depo testosterone level was 1400.  Urology stopped this.  There is no free testosterone level.     He has never seen endocrinology.  He has seen fertility urology x1.  This was a [facility].  No ETOH No h/o IV drugs  Liver/hepatic 2020, hepatitis was normal. 2013 colonoscopy normal -I have reviewed the patient's liver ultrasound as well, he does have some increased echogenicity.  Never been a smoker.   No f hx ulcerative cololtis or chrons  Increased echogenicity, hepatocellular disease vs fatty liver  Review of Systems is noted in the HPI, as appropriate  Objective:   BP 100/64    Pulse 74    Temp 98.3 F (36.8 C) (Temporal)    Ht 5' 5"  (1.651 m)    Wt 175 lb 6 oz (79.5 kg)    SpO2 93%    BMI 29.18 kg/m   GEN: No acute distress; alert,appropriate. PULM: Breathing comfortably in no respiratory distress PSYCH: Normally interactive.  CV: RRR, no m/g/r  PULM: Normal respiratory rate, no accessory muscle use. No wheezes, crackles or rhonchi  ABD: S, NT, ND, + BS, No rebound, No HSM.  He does have a Dilaudid pump in place in the right upper quadrant, this does obscure the liver and gallbladder on exam.  Laboratory and Imaging Data: Lab Review:  CBC EXTENDED Latest Ref Rng & Units 07/01/2021 02/25/2021 12/16/2020  WBC 3.4 - 10.8 x10E3/uL - 10.0 -  RBC 4.14 - 5.80 x10E6/uL - 5.75 -  HGB 13.0 - 17.7 g/dL 17.4 15.8 -  HCT 37.5 - 51.0 % 54.0(H) 48.5 48.5  PLT 150 - 450 x10E3/uL - 290 -  NEUTROABS 1.4 - 7.0 x10E3/uL - 5.6 -  LYMPHSABS 0.7 - 3.1 x10E3/uL - 3.4(H) -    BMP Latest Ref Rng & Units 03/03/2021 02/25/2021 06/13/2020  Glucose 65 - 99 mg/dL 118(H) 117(H) 120(H)  BUN 6 - 24 mg/dL 8 7 8   Creatinine 0.76 - 1.27 mg/dL 1.28(H) 1.44(H) 0.89  BUN/Creat Ratio 9 - 20 6(L) 5(L) 9  Sodium 134 - 144 mmol/L 141 142 144  Potassium 3.5 - 5.2 mmol/L 4.7 4.2 4.4  Chloride 96 - 106 mmol/L 99 100 104  CO2 20 - 29 mmol/L 24 28 26   Calcium 8.7 - 10.2 mg/dL 9.4 9.1 9.0    Hepatic Function Latest Ref Rng & Units 02/25/2021 06/13/2020 05/20/2020  Total Protein 6.0 - 8.5 g/dL 6.9 6.8 6.8   Albumin 4.0 - 5.0 g/dL 4.6 4.6 4.6  AST 0 - 40 IU/L 24 26 25   ALT 0 - 44 IU/L 17 30 33  Alk Phosphatase 44 - 121 IU/L 104 92 101  Total Bilirubin 0.0 - 1.2 mg/dL <0.2 0.3 0.4  Bilirubin, Direct 0.00 - 0.40 mg/dL - - <0.10    Lab Results  Component Value Date   CHOL 124 02/25/2021   Lab Results  Component Value Date   HDL 30 (L) 02/25/2021   Lab Results  Component Value Date   LDLCALC 71 02/25/2021   Lab Results  Component Value Date   TRIG 126 02/25/2021   Lab Results  Component Value Date   CHOLHDL 4.1 02/25/2021   No results for input(s): PSA in the last 72 hours. No results found for: HCVAB Lab Results  Component Value Date   VD25OH 39.2 02/25/2021   VD25OH 34.3 05/20/2020   VD25OH 28.0 (L) 02/19/2020     Lab Results  Component Value Date   HGBA1C 6.1 (H) 02/25/2021   HGBA1C 5.6 02/19/2020   HGBA1C 5.2 12/09/2016   Lab Results  Component Value Date   LDLCALC 71 02/25/2021   CREATININE 1.28 (H) 03/03/2021     Assessment and Plan:     ICD-10-CM   1. Nausea vomiting and diarrhea  R11.2 CT Abdomen Pelvis W Contrast   R19.7 Celiac panel 10    2. Chronic diarrhea  K52.9 CT Abdomen Pelvis W Contrast    Basic metabolic panel    CBC with Differential/Platelet    Hemoglobin A1c    Hepatic function panel    Lipase    Celiac panel 10    CANCELED: Basic metabolic panel    CANCELED: CBC with Differential/Platelet    CANCELED: Hepatic function panel    CANCELED: Lipase    CANCELED: Celiac Pnl 2 rflx Endomysial Ab Ttr    CANCELED: Hemoglobin A1c    CANCELED: Celiac Pnl 2 rflx Endomysial Ab Ttr    3. Unintended weight loss  R63.4 CT Abdomen Pelvis W Contrast    Ambulatory referral to Endocrinology    Basic metabolic panel    CBC with Differential/Platelet    Hemoglobin A1c    Hepatic function panel    Hepatitis C antibody    HIV Antibody (routine testing w rflx)    Lipase    T3, free    T4, free    TSH    Celiac panel 10    CANCELED: TSH     CANCELED: T3, free    CANCELED:  T4, free    CANCELED: Basic metabolic panel    CANCELED: CBC with Differential/Platelet    CANCELED: Hepatic function panel    CANCELED: Lipase    CANCELED: Hemoglobin A1c    CANCELED: HIV Antibody (routine testing w rflx)    CANCELED: Hepatitis C antibody    4. Low testosterone  R79.89 CT Abdomen Pelvis W Contrast    Ambulatory referral to Endocrinology    Celiac panel 10    5. 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 Low serum luteinizing hormone (LH)  R79.89 CT Abdomen Pelvis W Contrast    Ambulatory referral to Endocrinology    Celiac panel 10    8. Screening for HIV (human immunodeficiency virus)  Z11.4 HIV Antibody (routine testing w rflx)    Celiac panel 10    CANCELED: HIV Antibody (routine testing w rflx)    9. Screening for diabetes mellitus  Z13.1 Hemoglobin A1c    Celiac panel 10    CANCELED: Hemoglobin A1c    10. Intermediate hyperglycemia without complication  W09.8 Hemoglobin A1c    Celiac panel 10    CANCELED: Hemoglobin A1c     Total encounter time: 61 minutes. This includes total time spent on the day of encounter.    30 pound weight loss and a 50 year old is always very concerning.  Multiple pathologies in multiple organ systems are possible.  This deserves a very thorough work-up.  Hepatic: Increased echogenicity on the patient's ultrasound could indicate hepatocellular changes and cirrhosis.  This needs dedicated work-up.  We will check a CT of the abdomen and pelvis to further characterize.  Certainly could be fatty liver.  Renal: Follow BMP.  Endocrine: 1.  Hypothyroidism, recheck TSH, free T3, free T4. 2.  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  The patient's total testosterone at less than 3/undetectable is the lowest I have ever seen in my career.  I am doubtful that this is entirely from opioid use.  I think that this deserves a thorough endocrinology evaluation, and I am going to ask our colleagues to help assist.  I appreciate their help.  GI: Obtain a celiac panel.  He has had prior colonoscopy without evidence of ulcerative colitis or Crohn's. -Chronic nausea, vomiting, diarrhea of undetermined significance. -He needs to have a follow-up colonoscopy as well.  Obtain a CT of the abdomen and pelvis with contrast to rule out possible neoplasm.  CT of the abdomen pelvis to evaluate liver for potential cirrhosis or hepatocellular disease.  I am going to include the patient's primary care doctor here.  Orders Placed This Encounter  Procedures   CT Abdomen Pelvis W Contrast   Basic metabolic panel   CBC with Differential/Platelet   Hemoglobin A1c   Hepatic function panel   Hepatitis C antibody   HIV Antibody (routine testing w rflx)   Lipase   T3, free   T4, free   TSH   Celiac panel 10   Ambulatory referral to Endocrinology    Follow-up: My preference would be to involve primary care and additional specialist  management.  [employer] speech-to-text software was used for transcription in this dictation.  Possible transcriptional errors can occur using Editor, commissioning.   Signed,  Maud Deed. Bon Dowis, MD   Outpatient Encounter Medications as of 07/23/2021  Medication Sig   AMBULATORY NON FORMULARY MEDICATION Morphine Pump Implant   atorvastatin (LIPITOR) 10 MG tablet Take 1 tablet by mouth at bedtime.   busPIRone (BUSPAR) 10 MG tablet Take 1 tablet (10 mg total) by mouth 2 (two) times daily.   Cholecalciferol (VITAMIN D) 50 MCG (2000 UT) CAPS Take 1 capsule by mouth daily.   escitalopram (LEXAPRO) 20 MG tablet Take 1 tablet (20 mg total) by mouth daily.   Galcanezumab-gnlm (EMGALITY) 120  MG/ML SOAJ Inject 120 mg into the skin every 30 (thirty) days.   HYDROmorphone (DILAUDID) 2 MG tablet Take 2 mg by mouth 4 (four) times daily as needed.   hydrOXYzine (ATARAX/VISTARIL) 50 MG tablet Take 50 mg by mouth 2 (two) times daily.   lamoTRIgine (LAMICTAL) 200 MG tablet Take 1 tablet (200 mg total) by mouth 2 (two) times daily.   levothyroxine (SYNTHROID) 75 MCG tablet TAKE 1 TABLET BY MOUTH  DAILY BEFORE BREAKFAST   Omega-3 1000 MG CAPS Take 2 each by mouth daily.   OVER THE COUNTER MEDICATION    polyethylene glycol powder (GLYCOLAX/MIRALAX) 17 GM/SCOOP powder Take 17 g by mouth every other day as needed.   promethazine (PHENERGAN) 25 MG tablet TAKE 1/2 TO 1 TABLET BY MOUTH EVERY 8 HOURS AS NEEDED FOR NAUSEA AND VOMITING   SUMAtriptan (IMITREX) 100 MG tablet TAKE 1 TABLET (100 MG TOTAL) BY MOUTH EVERY 2 (TWO) HOURS AS NEEDED FOR MIGRAINE (MAX 2 DOSES IN 24 HOURS.). MAY REPEAT IN 2 HOURS IF HEADACHE PERSISTS OR RECURS.   tadalafil (CIALIS) 5 MG tablet Take 1 tablet (5 mg total) by mouth daily as needed.   testosterone cypionate (DEPOTESTOSTERONE CYPIONATE) 200 MG/ML injection INJECT 1 ML (200 MG TOTAL) INTO THE MUSCLE EVERY 14 DAYS   tiZANidine (ZANAFLEX) 4 MG tablet Take 4 mg by mouth 3 (three) times daily.   [DISCONTINUED] Galcanezumab-gnlm (EMGALITY) 120 MG/ML SOAJ Inject 120 mg into the skin every 30 (thirty) days.   No facility-administered encounter medications on file as of 07/23/2021.

## 2021-07-25 ENCOUNTER — Ambulatory Visit: Payer: BC Managed Care – PPO | Admitting: Family Medicine

## 2021-07-27 LAB — CBC WITH DIFFERENTIAL/PLATELET
Basophils Absolute: 0 10*3/uL (ref 0.0–0.2)
Basos: 0 %
EOS (ABSOLUTE): 0.1 10*3/uL (ref 0.0–0.4)
Eos: 1 %
Hematocrit: 49.1 % (ref 37.5–51.0)
Hemoglobin: 16.8 g/dL (ref 13.0–17.7)
Immature Grans (Abs): 0 10*3/uL (ref 0.0–0.1)
Immature Granulocytes: 0 %
Lymphocytes Absolute: 1.7 10*3/uL (ref 0.7–3.1)
Lymphs: 21 %
MCH: 29.7 pg (ref 26.6–33.0)
MCHC: 34.2 g/dL (ref 31.5–35.7)
MCV: 87 fL (ref 79–97)
Monocytes Absolute: 0.6 10*3/uL (ref 0.1–0.9)
Monocytes: 8 %
Neutrophils Absolute: 5.5 10*3/uL (ref 1.4–7.0)
Neutrophils: 70 %
Platelets: 270 10*3/uL (ref 150–450)
RBC: 5.66 x10E6/uL (ref 4.14–5.80)
RDW: 15.1 % (ref 11.6–15.4)
WBC: 7.9 10*3/uL (ref 3.4–10.8)

## 2021-07-27 LAB — CELIAC PANEL 10
Antigliadin Abs, IgA: 5 units (ref 0–19)
Endomysial IgA: NEGATIVE
Gliadin IgG: 3 units (ref 0–19)
IgA/Immunoglobulin A, Serum: 290 mg/dL (ref 90–386)
Tissue Transglut Ab: 3 U/mL (ref 0–5)
Transglutaminase IgA: <2 U/mL (ref 0–3)

## 2021-07-27 LAB — HEPATIC FUNCTION PANEL
ALT: 18 IU/L (ref 0–44)
AST: 23 IU/L (ref 0–40)
Albumin: 5.1 g/dL — ABNORMAL HIGH (ref 4.0–5.0)
Alkaline Phosphatase: 104 IU/L (ref 44–121)
Bilirubin Total: 0.3 mg/dL (ref 0.0–1.2)
Bilirubin, Direct: <0.1 mg/dL (ref 0.00–0.40)
Total Protein: 7.2 g/dL (ref 6.0–8.5)

## 2021-07-27 LAB — HEPATITIS C ANTIBODY: Hep C Virus Ab: <0.1 s/co ratio (ref 0.0–0.9)

## 2021-07-27 LAB — BASIC METABOLIC PANEL
BUN/Creatinine Ratio: 8 — ABNORMAL LOW (ref 9–20)
BUN: 11 mg/dL (ref 6–24)
CO2: 23 mmol/L (ref 20–29)
Calcium: 9.5 mg/dL (ref 8.7–10.2)
Chloride: 99 mmol/L (ref 96–106)
Creatinine, Ser: 1.41 mg/dL — ABNORMAL HIGH (ref 0.76–1.27)
Glucose: 72 mg/dL (ref 70–99)
Potassium: 4.3 mmol/L (ref 3.5–5.2)
Sodium: 141 mmol/L (ref 134–144)
eGFR: 61 mL/min/{1.73_m2} (ref >59)

## 2021-07-27 LAB — LIPASE: Lipase: 32 U/L (ref 13–78)

## 2021-07-27 LAB — T4, FREE: Free T4: 1.22 ng/dL (ref 0.82–1.77)

## 2021-07-27 LAB — HIV ANTIBODY (ROUTINE TESTING W REFLEX): HIV Screen 4th Generation wRfx: NONREACTIVE

## 2021-07-27 LAB — HEMOGLOBIN A1C
Est. average glucose Bld gHb Est-mCnc: 114 mg/dL
Hgb A1c MFr Bld: 5.6 % (ref 4.8–5.6)

## 2021-07-27 LAB — TSH: TSH: 1.67 u[IU]/mL (ref 0.450–4.500)

## 2021-07-27 LAB — T3, FREE: T3, Free: 2.7 pg/mL (ref 2.0–4.4)

## 2021-07-29 ENCOUNTER — Other Ambulatory Visit: Payer: Self-pay | Admitting: Family Medicine

## 2021-08-06 DIAGNOSIS — F603 Borderline personality disorder: Secondary | ICD-10-CM | POA: Diagnosis not present

## 2021-08-06 DIAGNOSIS — F332 Major depressive disorder, recurrent severe without psychotic features: Secondary | ICD-10-CM | POA: Diagnosis not present

## 2021-08-06 DIAGNOSIS — F411 Generalized anxiety disorder: Secondary | ICD-10-CM | POA: Diagnosis not present

## 2021-08-14 DIAGNOSIS — M545 Low back pain, unspecified: Secondary | ICD-10-CM | POA: Diagnosis not present

## 2021-08-14 DIAGNOSIS — M5417 Radiculopathy, lumbosacral region: Secondary | ICD-10-CM | POA: Diagnosis not present

## 2021-08-14 DIAGNOSIS — G894 Chronic pain syndrome: Secondary | ICD-10-CM | POA: Diagnosis not present

## 2021-08-14 DIAGNOSIS — Z978 Presence of other specified devices: Secondary | ICD-10-CM | POA: Diagnosis not present

## 2021-08-19 ENCOUNTER — Ambulatory Visit
Admission: RE | Admit: 2021-08-19 | Discharge: 2021-08-19 | Disposition: A | Discharge status: Home / Self Care | Payer: BC Managed Care – PPO | Source: Ambulatory Visit | Attending: Family Medicine | Admitting: Family Medicine

## 2021-08-19 DIAGNOSIS — R112 Nausea with vomiting, unspecified: Secondary | ICD-10-CM

## 2021-08-19 DIAGNOSIS — K529 Noninfective gastroenteritis and colitis, unspecified: Secondary | ICD-10-CM

## 2021-08-19 DIAGNOSIS — R197 Diarrhea, unspecified: Secondary | ICD-10-CM

## 2021-08-19 DIAGNOSIS — R7989 Other specified abnormal findings of blood chemistry: Secondary | ICD-10-CM

## 2021-08-19 DIAGNOSIS — R634 Abnormal weight loss: Secondary | ICD-10-CM | POA: Diagnosis not present

## 2021-08-19 DIAGNOSIS — E038 Other specified hypothyroidism: Secondary | ICD-10-CM

## 2021-08-19 DIAGNOSIS — E348 Other specified endocrine disorders: Secondary | ICD-10-CM

## 2021-08-19 MED ORDER — IOPAMIDOL (ISOVUE-300) INJECTION 61%
100.0000 mL | Freq: Once | INTRAVENOUS | Status: AC | PRN
  Administered 2021-08-19: 100 mL via INTRAVENOUS

## 2021-09-02 ENCOUNTER — Telehealth: Payer: Self-pay | Admitting: Family Medicine

## 2021-09-02 NOTE — Telephone Encounter (Signed)
LVM for pt to rtn my call to schedule AWV with [facility]. Please schedule this appt if pt calls the office.  °

## 2021-09-03 ENCOUNTER — Other Ambulatory Visit: Payer: Self-pay | Admitting: Family Medicine

## 2021-09-10 ENCOUNTER — Other Ambulatory Visit: Payer: Self-pay

## 2021-09-10 DIAGNOSIS — F332 Major depressive disorder, recurrent severe without psychotic features: Secondary | ICD-10-CM | POA: Diagnosis not present

## 2021-09-10 DIAGNOSIS — F410 Panic disorder [episodic paroxysmal anxiety] without agoraphobia: Secondary | ICD-10-CM | POA: Diagnosis not present

## 2021-09-10 DIAGNOSIS — F603 Borderline personality disorder: Secondary | ICD-10-CM | POA: Diagnosis not present

## 2021-09-10 DIAGNOSIS — R350 Frequency of micturition: Secondary | ICD-10-CM

## 2021-09-10 DIAGNOSIS — F411 Generalized anxiety disorder: Secondary | ICD-10-CM | POA: Diagnosis not present

## 2021-09-12 ENCOUNTER — Other Ambulatory Visit: Payer: Medicare Other

## 2021-09-12 ENCOUNTER — Other Ambulatory Visit: Payer: Self-pay

## 2021-09-12 DIAGNOSIS — N400 Enlarged prostate without lower urinary tract symptoms: Secondary | ICD-10-CM

## 2021-09-12 DIAGNOSIS — E349 Endocrine disorder, unspecified: Secondary | ICD-10-CM | POA: Diagnosis not present

## 2021-09-12 DIAGNOSIS — R350 Frequency of micturition: Secondary | ICD-10-CM

## 2021-09-12 DIAGNOSIS — E230 Hypopituitarism: Secondary | ICD-10-CM

## 2021-09-12 LAB — URINALYSIS, COMPLETE
Bilirubin, UA: NEGATIVE
Glucose, UA: NEGATIVE
Ketones, UA: NEGATIVE
Leukocytes,UA: NEGATIVE
Nitrite, UA: NEGATIVE
Protein,UA: NEGATIVE
RBC, UA: NEGATIVE
Specific Gravity, UA: 1.015 (ref 1.005–1.030)
Urobilinogen, Ur: 0.2 mg/dL (ref 0.2–1.0)
pH, UA: 6.5 (ref 5.0–7.5)

## 2021-09-12 LAB — MICROSCOPIC EXAMINATION
Bacteria, UA: NONE SEEN
Epithelial Cells (non renal): NONE SEEN /hpf (ref 0–10)

## 2021-09-13 LAB — TESTOSTERONE: Testosterone: 65 ng/dL — ABNORMAL LOW (ref 264–916)

## 2021-09-13 LAB — HEMOGLOBIN AND HEMATOCRIT, BLOOD
Hematocrit: 43.8 % (ref 37.5–51.0)
Hemoglobin: 14.7 g/dL (ref 13.0–17.7)

## 2021-09-15 ENCOUNTER — Other Ambulatory Visit: Payer: Self-pay | Admitting: Urology

## 2021-09-15 DIAGNOSIS — E349 Endocrine disorder, unspecified: Secondary | ICD-10-CM

## 2021-09-26 DIAGNOSIS — F411 Generalized anxiety disorder: Secondary | ICD-10-CM | POA: Diagnosis not present

## 2021-09-26 DIAGNOSIS — F332 Major depressive disorder, recurrent severe without psychotic features: Secondary | ICD-10-CM | POA: Diagnosis not present

## 2021-09-26 DIAGNOSIS — F603 Borderline personality disorder: Secondary | ICD-10-CM | POA: Diagnosis not present

## 2021-09-26 DIAGNOSIS — F410 Panic disorder [episodic paroxysmal anxiety] without agoraphobia: Secondary | ICD-10-CM | POA: Diagnosis not present

## 2021-10-02 ENCOUNTER — Ambulatory Visit (INDEPENDENT_AMBULATORY_CARE_PROVIDER_SITE_OTHER): Payer: BC Managed Care – PPO | Admitting: Family Medicine

## 2021-10-02 ENCOUNTER — Encounter: Payer: Self-pay | Admitting: Family Medicine

## 2021-10-02 VITALS — BP 117/81 | HR 88 | Ht 65.0 in | Wt 170.5 lb

## 2021-10-02 DIAGNOSIS — G43109 Migraine with aura, not intractable, without status migrainosus: Secondary | ICD-10-CM
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
## 2021-10-02 NOTE — Patient Instructions (Signed)
Below is our plan:  We will Emgality monthly and Imitrex as needed. Consider lidocaine cream for injection tenderness. You can get this at any pharmacy. Please consider sleep apnea management. With weight loss, you may consider repeating sleep study.   Please make sure you are staying well hydrated. I recommend 50-60 ounces daily. Well balanced diet and regular exercise encouraged. Consistent sleep schedule with 6-8 hours recommended.   Please continue follow up with care team as directed.   Follow up with me in 1 year   You may receive a survey regarding today's visit. I encourage you to leave honest feed back as I do use this information to improve patient care. Thank you for seeing me today!

## 2021-10-09 ENCOUNTER — Other Ambulatory Visit: Payer: Self-pay

## 2021-10-09 MED ORDER — SUMATRIPTAN SUCCINATE 100 MG PO TABS: 100.0000 mg | ORAL_TABLET | ORAL | 4 refills | Status: DC | PRN

## 2021-10-18 ENCOUNTER — Other Ambulatory Visit: Payer: Self-pay | Admitting: Urology

## 2021-10-18 DIAGNOSIS — E349 Endocrine disorder, unspecified: Secondary | ICD-10-CM

## 2021-11-04 DIAGNOSIS — M5417 Radiculopathy, lumbosacral region: Secondary | ICD-10-CM | POA: Diagnosis not present

## 2021-11-04 DIAGNOSIS — M545 Low back pain, unspecified: Secondary | ICD-10-CM | POA: Diagnosis not present

## 2021-11-04 DIAGNOSIS — Z978 Presence of other specified devices: Secondary | ICD-10-CM | POA: Diagnosis not present

## 2021-11-04 DIAGNOSIS — G8929 Other chronic pain: Secondary | ICD-10-CM | POA: Diagnosis not present

## 2021-11-10 ENCOUNTER — Ambulatory Visit: Payer: Medicare Other

## 2021-11-11 ENCOUNTER — Other Ambulatory Visit: Payer: Medicare Other

## 2021-11-11 ENCOUNTER — Other Ambulatory Visit: Payer: Self-pay

## 2021-11-11 ENCOUNTER — Other Ambulatory Visit: Payer: Self-pay | Admitting: *Deleted

## 2021-11-11 DIAGNOSIS — E349 Endocrine disorder, unspecified: Secondary | ICD-10-CM

## 2021-11-11 DIAGNOSIS — E291 Testicular hypofunction: Secondary | ICD-10-CM

## 2021-11-11 MED ORDER — "BD LUER-LOK SYRINGE 21G X 1-1/2"" 3 ML MISC": 0 refills | Status: DC

## 2021-11-11 MED ORDER — "BD SAFETYGLIDE NEEDLE 18G X 1-1/2"" MISC": 0 refills | Status: DC

## 2021-11-12 ENCOUNTER — Other Ambulatory Visit: Payer: Self-pay | Admitting: Family Medicine

## 2021-11-12 ENCOUNTER — Ambulatory Visit: Payer: BC Managed Care – PPO

## 2021-11-12 LAB — TESTOSTERONE: Testosterone: 743 ng/dL (ref 264–916)

## 2021-11-12 LAB — HEMATOCRIT: Hematocrit: 47.6 % (ref 37.5–51.0)

## 2021-11-12 LAB — HEMOGLOBIN: Hemoglobin: 15.6 g/dL (ref 13.0–17.7)
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
## 2021-11-13 DIAGNOSIS — F411 Generalized anxiety disorder: Secondary | ICD-10-CM | POA: Diagnosis not present

## 2021-11-13 DIAGNOSIS — F4312 Post-traumatic stress disorder, chronic: Secondary | ICD-10-CM | POA: Diagnosis not present

## 2021-11-13 DIAGNOSIS — F603 Borderline personality disorder: Secondary | ICD-10-CM | POA: Diagnosis not present

## 2021-11-13 DIAGNOSIS — F410 Panic disorder [episodic paroxysmal anxiety] without agoraphobia: Secondary | ICD-10-CM | POA: Diagnosis not present

## 2021-11-15 ENCOUNTER — Other Ambulatory Visit: Payer: Self-pay | Admitting: Urology

## 2021-11-15 DIAGNOSIS — E349 Endocrine disorder, unspecified: Secondary | ICD-10-CM

## 2021-11-19 DIAGNOSIS — M533 Sacrococcygeal disorders, not elsewhere classified: Secondary | ICD-10-CM | POA: Diagnosis not present

## 2021-11-19 DIAGNOSIS — Z5181 Encounter for therapeutic drug level monitoring: Secondary | ICD-10-CM | POA: Diagnosis not present

## 2021-11-19 DIAGNOSIS — Z79899 Other long term (current) drug therapy: Secondary | ICD-10-CM | POA: Diagnosis not present

## 2021-11-19 DIAGNOSIS — F1190 Opioid use, unspecified, uncomplicated: Secondary | ICD-10-CM | POA: Diagnosis not present

## 2021-11-19 DIAGNOSIS — G894 Chronic pain syndrome: Secondary | ICD-10-CM | POA: Diagnosis not present

## 2021-11-19 DIAGNOSIS — M5417 Radiculopathy, lumbosacral region: Secondary | ICD-10-CM | POA: Diagnosis not present

## 2021-12-15 ENCOUNTER — Other Ambulatory Visit: Payer: Self-pay | Admitting: Urology

## 2021-12-15 DIAGNOSIS — F4312 Post-traumatic stress disorder, chronic: Secondary | ICD-10-CM | POA: Diagnosis not present

## 2021-12-15 DIAGNOSIS — F603 Borderline personality disorder: Secondary | ICD-10-CM | POA: Diagnosis not present

## 2021-12-15 DIAGNOSIS — F411 Generalized anxiety disorder: Secondary | ICD-10-CM | POA: Diagnosis not present

## 2021-12-15 DIAGNOSIS — E349 Endocrine disorder, unspecified: Secondary | ICD-10-CM

## 2021-12-15 DIAGNOSIS — F410 Panic disorder [episodic paroxysmal anxiety] without agoraphobia: Secondary | ICD-10-CM | POA: Diagnosis not present

## 2021-12-16 ENCOUNTER — Other Ambulatory Visit: Payer: Self-pay | Admitting: Urology

## 2021-12-16 DIAGNOSIS — E349 Endocrine disorder, unspecified: Secondary | ICD-10-CM

## 2021-12-16 MED ORDER — TESTOSTERONE CYPIONATE 200 MG/ML IM SOLN: 100.0000 mg | INTRAMUSCULAR | 0 refills | Status: DC

## 2022-01-09 ENCOUNTER — Ambulatory Visit (INDEPENDENT_AMBULATORY_CARE_PROVIDER_SITE_OTHER): Payer: BC Managed Care – PPO | Admitting: Nurse Practitioner

## 2022-01-09 VITALS — BP 104/76 | HR 107 | Temp 98.0°F | Resp 16 | Ht 65.0 in | Wt 183.0 lb

## 2022-01-09 DIAGNOSIS — K645 Perianal venous thrombosis: Secondary | ICD-10-CM | POA: Diagnosis not present

## 2022-01-09 MED ORDER — PROCTOFOAM HC 1-1 % EX FOAM: 1.0000 | Freq: Two times a day (BID) | CUTANEOUS | 0 refills | Status: DC

## 2022-01-09 NOTE — Progress Notes (Signed)
   Acute Office Visit  Subjective:     Patient ID: Cody [name], male    DOB: [birth year], 51 y.o.   MRN: [id number]  Chief Complaint  Patient presents with   Hemorrhoids    X 2 months, getting bigger, external hemorrhoid. Pain is present, having trouble with bowel movements due to its been so big. Has tried Preparation H, cortisone cream.    HPI Patient is in today for hemorrhiods  States that it has been present for approx 2 months but has become more bothersome and larger in size since it started. States that he feels like it has tripled in size  Has tried witch hazel, cortisone cream, preperation H without relief. States that he is having pain, bleeding and itching. States that his bowels are soft. He has a BM daily.    Review of Systems  Constitutional:  Negative for chills and fever.  Gastrointestinal:  Negative for abdominal pain.  Genitourinary:  Negative for dysuria and hematuria.       Objective:    BP 104/76   Pulse (!) 107   Temp 98 F (36.7 C)   Resp 16   Ht 5\' 5"  (1.651 m)   Wt 183 lb (83 kg)   SpO2 96%   BMI 30.45 kg/m    Physical Exam Vitals and nursing note reviewed. Exam conducted with a chaperone present [facility] Jackpot, RMA).  Constitutional:      Appearance: Normal appearance.  Cardiovascular:     Rate and Rhythm: Normal rate and regular rhythm.  Pulmonary:     Breath sounds: Normal breath sounds.  Abdominal:     General: Bowel sounds are normal. There is no distension.     Palpations: There is no mass.     Tenderness: There is no abdominal tenderness.     Hernia: No hernia is present.    Genitourinary:   Neurological:     Mental Status: He is alert.    No results found for any visits on 01/09/22.      Assessment & Plan:   Problem List Items Addressed This Visit   None Visit Diagnoses     Thrombosed hemorrhoids    -  Primary   Relevant Medications   hydrocortisone-pramoxine (PROCTOFOAM HC) rectal foam       Meds  ordered this encounter  Medications   hydrocortisone-pramoxine (PROCTOFOAM HC) rectal foam    Sig: Place 1 applicator rectally 2 (two) times daily. Over the weekend until you see GI    Dispense:  10 g    Refill:  0    Order Specific Question:   Supervising Provider    Answer:   TOWER, MARNE A [[id number]]    No follow-ups on file.  03/11/22, NP

## 2022-01-09 NOTE — Patient Instructions (Signed)
Nice to see you today I will reach out to Dr. Leone Payor and They will reach out to get you scheduled I sent in the foam to try until you get to GI

## 2022-01-09 NOTE — Assessment & Plan Note (Signed)
Large thrombosed hemorrhoid.  Patient has tried over-the-counter modalities without great relief I will send in some Proctofoam to see if this will help with some of the discomfort or weight for patient to get in with presumed GI for hemorrhoid removal.

## 2022-01-10 ENCOUNTER — Other Ambulatory Visit: Payer: Self-pay | Admitting: Family Medicine

## 2022-01-10 ENCOUNTER — Other Ambulatory Visit: Payer: Self-pay | Admitting: Urology

## 2022-01-10 DIAGNOSIS — E349 Endocrine disorder, unspecified: Secondary | ICD-10-CM

## 2022-01-12 ENCOUNTER — Telehealth: Payer: Self-pay

## 2022-01-12 DIAGNOSIS — F4312 Post-traumatic stress disorder, chronic: Secondary | ICD-10-CM | POA: Diagnosis not present

## 2022-01-12 DIAGNOSIS — F603 Borderline personality disorder: Secondary | ICD-10-CM | POA: Diagnosis not present

## 2022-01-12 DIAGNOSIS — F411 Generalized anxiety disorder: Secondary | ICD-10-CM | POA: Diagnosis not present

## 2022-01-12 DIAGNOSIS — K645 Perianal venous thrombosis: Secondary | ICD-10-CM

## 2022-01-12 DIAGNOSIS — F410 Panic disorder [episodic paroxysmal anxiety] without agoraphobia: Secondary | ICD-10-CM | POA: Diagnosis not present

## 2022-01-12 NOTE — Telephone Encounter (Signed)
Spoke with patient to follow up on the hemorrhoid. Patient states his hemorrhoid is not better. Advised about seen general surgeon vs GI and the reason. Patient states he has hemorrhoid outside and internal. Advised we suggest starting with general surgeon. [place] please. [place] [facility].

## 2022-01-12 NOTE — Telephone Encounter (Signed)
Referral placed.

## 2022-01-13 ENCOUNTER — Ambulatory Visit (INDEPENDENT_AMBULATORY_CARE_PROVIDER_SITE_OTHER): Payer: BC Managed Care – PPO | Admitting: Surgery

## 2022-01-13 ENCOUNTER — Encounter: Payer: Self-pay | Admitting: Surgery

## 2022-01-13 ENCOUNTER — Telehealth: Payer: Self-pay | Admitting: Surgery

## 2022-01-13 ENCOUNTER — Ambulatory Visit: Payer: Self-pay | Admitting: Surgery

## 2022-01-13 VITALS — BP 124/87 | HR 99 | Temp 98.0°F | Ht 65.0 in | Wt 184.6 lb

## 2022-01-13 DIAGNOSIS — K645 Perianal venous thrombosis: Secondary | ICD-10-CM | POA: Diagnosis not present

## 2022-01-13 DIAGNOSIS — K594 Anal spasm: Secondary | ICD-10-CM | POA: Diagnosis not present

## 2022-01-13 NOTE — Telephone Encounter (Signed)
Patient has been advised of Pre-Admission date/time, COVID Testing date and Surgery date.  Surgery Date: 01/28/22 Preadmission Testing Date: 01/22/22 (phone 8a-1p) Covid Testing Date: Not needed.     Patient has been made aware to call [phone], between 1-3:00pm the day before surgery, to find out what time to arrive for surgery.

## 2022-01-13 NOTE — H&P (View-Only) (Signed)
Patient ID: Cody [name], male   DOB: 23-Feb-1971, 51 y.o.   MRN: [id number]  Chief Complaint: Anal/hemorrhoidal pain  History of Present Illness Cody [name] is a 51 y.o. male with minimal prior history of hemorrhoids over the years.  About 6 and half weeks ago with some episodes of diarrhea he began having severe pain.  No prior history of thrombosis, but found a external mass that was quite tender.  He has been maintaining soft stools and not straining.  Though previously with chronic opioids he has a history of constipation.  But this is long since resolved.  He denies any blood in his stools but has noted little bit of bright red on wiping.  Its mostly pain and swelling with pain during actual bowel activity.  He is utilized warm showers, witch hazel, Proctofoam and Preparation H.  He currently does not take any known fiber supplements or bowel regimen.  He denies any family history of colon cancer.  His last colonoscopy was 10 years ago.  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; herniated disc L5- S1   Degenerative disk disease    Depression    Fracture, mandible (HCC)    Fracture, tibia    Hyperlipidemia    Hypothyroidism 05/2007   Dr Marga Melnick (retired)/ Dr Crawford Givens   PTSD (post-traumatic stress disorder)    suicide attempt X1, gestureX 1      Past Surgical History:  Procedure Laterality Date   APPENDECTOMY     peritonitis   INTRATHECAL PUMP IMPLANTATION  2007   LAMINECTOMY  2004   arterial injury, transfused 7 units pc, implant surgery    MANDIBLE FRACTURE SURGERY     mugged   THORACIC DISCECTOMY     T10   UPPER GASTROINTESTINAL ENDOSCOPY  2006   gastritis    Allergies  Allergen Reactions   Neurontin  [Gabapentin]     lethargic   Vancomycin     Swelling    Doxycycline     vomiting   Lithium     tremor   Mirtazapine     REACTION: lethargy ( Remeron)    Current Outpatient Medications  Medication Sig Dispense Refill   ALPRAZolam (XANAX) 0.5 MG tablet Take 0.5 mg by mouth at bedtime as needed.     AMBULATORY NON FORMULARY MEDICATION Morphine Pump Implant     atorvastatin (LIPITOR) 10 MG tablet Take 1 tablet by mouth at bedtime.     busPIRone (BUSPAR) 10 MG tablet Take 1 tablet (10 mg total) by mouth 2 (two) times daily.     CAPLYTA 42 MG capsule Take 42 mg by mouth daily.     Cholecalciferol (VITAMIN D) 50 MCG (2000 UT) CAPS Take 1 capsule by mouth daily.     escitalopram (LEXAPRO) 20 MG tablet Take 1 tablet (20 mg total) by mouth daily.     Galcanezumab-gnlm (EMGALITY) 120 MG/ML SOAJ Inject 120 mg into the skin every 30 (thirty) days. 3 mL 3   hydrocortisone-pramoxine (PROCTOFOAM HC) rectal foam Place 1 applicator rectally 2 (two) times daily. Over the weekend until you see GI 10 g 0   HYDROmorphone (DILAUDID) 2 MG  tablet Take 2 mg by mouth 4 (four) times daily as needed.     hydrOXYzine (ATARAX/VISTARIL) 50 MG tablet Take 50 mg by mouth 2 (two) times daily.     lamoTRIgine (LAMICTAL) 200 MG tablet Take 1 tablet (200 mg total) by mouth 2 (two) times daily.     levothyroxine (SYNTHROID) 75 MCG tablet TAKE 1 TABLET BY MOUTH DAILY  BEFORE BREAKFAST 90 tablet 3   NEEDLE, DISP, 18 G (BD SAFETYGLIDE NEEDLE) 18G X 1-1/2" MISC Draw up 50 each 0   Omega-3 1000 MG CAPS Take 2 each by mouth daily.     polyethylene glycol powder (GLYCOLAX/MIRALAX) 17 GM/SCOOP powder Take 17 g by mouth every other day as needed.     promethazine (PHENERGAN) 25 MG tablet TAKE 1/2 TO 1 TABLET BY MOUTH EVERY 8 HOURS AS NEEDED FOR NAUSEA AND VOMITING 20 tablet 1   SUMAtriptan (IMITREX) 100 MG tablet Take 1 tablet (100 mg total) by mouth every 2 (two) hours as needed for migraine (max 2 doses in 24 hours.). May repeat  in 2 hours if headache persists or recurs. 20 tablet 4   SYRINGE-NEEDLE, DISP, 3 ML (B-D 3CC LUER-LOK SYR 21GX1-1/2) 21G X 1-1/2" 3 ML MISC Use this needle to injection 50 each 0   tadalafil (CIALIS) 5 MG tablet Take 1 tablet (5 mg total) by mouth daily as needed. 90 tablet 3   testosterone cypionate (DEPOTESTOSTERONE CYPIONATE) 200 MG/ML injection INJECT 0.5 MLS (100 MG TOTAL) INTO THE MUSCLE EVERY 14 (FOURTEEN) DAYS. 2 mL 0   tiZANidine (ZANAFLEX) 4 MG tablet Take 4 mg by mouth 3 (three) times daily.     topiramate (TOPAMAX) 25 MG tablet Take by mouth.     No current facility-administered medications for this visit.    Family History Family History  Problem Relation Age of Onset   Ulcers Mother    Depression Mother    Thyroid disease Mother        hypo   Stroke Father 21   Thyroid disease Father        hypo   Lung cancer Maternal Grandmother    Thyroid disease Maternal Grandmother        hypo   Colon cancer Maternal Grandfather    Heart attack Maternal Grandfather 40   Esophageal cancer Maternal Grandfather    Diabetes Neg Hx    Prostate cancer Neg Hx    Kidney cancer Neg Hx    Bladder Cancer Neg Hx    Migraines Neg Hx    Headache Neg Hx       Social History Social History   Tobacco Use   Smoking status: Former    Packs/day: 0.25    Types: E-cigarettes, Cigarettes    Quit date: 08/10/2009    Years since quitting: 12.4    Passive exposure: Never   Smokeless tobacco: Never   Tobacco comments:    smoked age intermittently age [age]; up to 2 cigarettes/ day   Vaping Use   Vaping Use: Never used  Substance Use Topics   Alcohol use: No   Drug use: No        Review of Systems  Constitutional: Negative.   HENT: Negative.    Eyes: Negative.   Respiratory: Negative.    Cardiovascular: Negative.   Gastrointestinal:  Positive for diarrhea.  Genitourinary: Negative.   Skin: Negative.   Neurological: Negative.   Psychiatric/Behavioral:  Positive for  depression.      Physical Exam Blood pressure 124/87,  pulse 99, temperature 98 F (36.7 C), height 5\' 5"  (1.651 m), weight 184 lb 9.6 oz (83.7 kg), SpO2 97 %. Last Weight  Most recent update: 01/13/2022 11:21 AM    Weight  83.7 kg (184 lb 9.6 oz)             CONSTITUTIONAL: Well developed, and nourished, appropriately responsive and aware without distress.   EYES: Sclera non-icteric.   EARS, NOSE, MOUTH AND THROAT:  The oropharynx is clear. Oral mucosa is pink and moist.   Hearing is intact to voice.  NECK: Trachea is midline, and there is no jugular venous distension.  LYMPH NODES:  Lymph nodes in the neck are not enlarged. RESPIRATORY:  Lungs are clear, and breath sounds are equal bilaterally. Normal respiratory effort without pathologic use of accessory muscles. CARDIOVASCULAR: Heart is regular in rate and rhythm. GI: The abdomen is soft, nontender, and nondistended.  GU: Prominent mid left sided external hemorrhoid, chronically thrombosed without evidence of ulceration.  Tender to touch with extension internally with the same column.  No other hemorrhoidal piles or redundancy appreciated on limited exam due to tolerance.  No evidence of fissure, fistula or abscess present.  No sentinel tags or others external hemorrhoidal tags noted. MUSCULOSKELETAL:  Symmetrical muscle tone appreciated in all four extremities.    SKIN: Skin turgor is normal. No pathologic skin lesions appreciated.  NEUROLOGIC:  Motor and sensation appear grossly normal.  Cranial nerves are grossly without defect. PSYCH:  Alert and oriented to person, place and time. Affect is appropriate for situation.  Data Reviewed I have personally reviewed what is currently available of the patient's imaging, recent labs and medical records.   Labs:     Latest Ref Rng & Units 11/11/2021   10:41 AM 09/12/2021   10:16 AM 07/23/2021    9:51 AM  CBC  WBC 3.4 - 10.8 x10E3/uL   7.9    Hemoglobin 13.0 - 17.7 g/dL 16.115.6   09.614.7   04.516.8     Hematocrit 37.5 - 51.0 % 47.6   43.8   49.1    Platelets 150 - 450 x10E3/uL   270        Latest Ref Rng & Units 07/23/2021    9:51 AM 03/03/2021   12:56 PM 02/25/2021    8:37 AM  CMP  Glucose 70 - 99 mg/dL 72   409118   [id number]    BUN 6 - 24 mg/dL 11   8   7     Creatinine 0.76 - 1.27 mg/dL 9.141.41   7.821.28   9.561.44    Sodium 134 - 144 mmol/L 141   141   142    Potassium 3.5 - 5.2 mmol/L 4.3   4.7   4.2    Chloride 96 - 106 mmol/L 99   99   100    CO2 20 - 29 mmol/L 23   24   28     Calcium 8.7 - 10.2 mg/dL 9.5   9.4   9.1    Total Protein 6.0 - 8.5 g/dL 7.2    6.9    Total Bilirubin 0.0 - 1.2 mg/dL 0.3    <2.1<0.2    Alkaline Phos 44 - 121 IU/L 104    104    AST 0 - 40 IU/L 23    24    ALT 0 - 44 IU/L 18    17        Imaging:  Within last 24 hrs: No results  found.  Assessment    Thrombosed internal/external hemorrhoid. Anal pain/spasm. Patient Active Problem List   Diagnosis Date Noted   Thrombosed hemorrhoids 01/09/2022   Hypertension 02/24/2021   Vaping nicotine dependence, 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   Chronic migraine with aura 11/12/2020   Chronic migraine without aura without status migrainosus, not intractable 11/12/2020   Nonintractable headache 09/01/2020   Diarrhea 06/16/2020   Medicare annual wellness visit, initial 03/04/2020   Fatty liver 07/05/2019   Obesity with body mass index greater than 30 04/27/2018   Advance care planning 09/15/2015   LLQ pain 09/15/2015   Low serum testosterone level 04/04/2011   Vitamin D deficiency 05/01/2010   CIGARETTE SMOKER 05/01/2010   HLD (hyperlipidemia) 04/01/2009   Anxiety state 04/01/2009   Bipolar affective (HCC) 04/01/2009   FASTING HYPERGLYCEMIA 04/01/2009   Hypothyroidism 04/07/2007   SYNDROME, CHRONIC PAIN 04/07/2007    Plan    Rectal exam under anesthesia, hemorrhoidectomy.  Risks reviewed include but not limited to anesthesia, bleeding, recurrence, anal stenosis, spasm, diminished function or potential soilage,  etc.  Questions answered, no guarantees expressed or implied.   Face-to-face time spent with the patient and accompanying care providers(if present) was 30 minutes, with more than 50% of the time spent counseling, educating, and coordinating care of the patient.    These notes generated with voice recognition software. I apologize for typographical errors.  Campbell Lerner M.D., FACS 01/13/2022, 12:12 PM

## 2022-01-13 NOTE — Patient Instructions (Signed)
Our surgery scheduler Barbara will call you within 24-48 hours to get you scheduled. If you have not heard from her after 48 hours, please call our office. Have the blue sheet available when she calls to write down important information.   If you have any concerns or questions, please feel free to call our office.   Surgical Procedures for Hemorrhoids Surgical procedures can be used to treat hemorrhoids. Hemorrhoids are swollen veins that are inside the rectum (internal hemorrhoids) or around the anus (external hemorrhoids). They are caused by increased pressure in the anal area. This pressure may result from straining to have a bowel movement (constipation), diarrhea, pregnancy, obesity, or sitting for long periods of time. Hemorrhoids can cause symptoms such as pain and bleeding. Surgery may be needed if diet changes, lifestyle changes, and other treatments do not help your symptoms. Common surgical methods that may be used include: Closed hemorrhoidectomy. The hemorrhoids are surgically removed, and the incisions are closed with stitches (sutures). Open hemorrhoidectomy. The hemorrhoids are surgically removed, but the incisions are allowed to heal without sutures. Stapled hemorrhoidectomy. The hemorrhoids are partially removed, and the incisions are closed with staples. Tell a health care provider about: Any allergies you have. All medicines you are taking, including vitamins, herbs, eye drops, creams, and over-the-counter medicines. Any problems you or family members have had with anesthetic medicines. Any blood disorders you have. Any surgeries you have had. Any medical conditions you have. Whether you are pregnant or may be pregnant. What are the risks? Generally, this is a safe procedure. However, problems may occur, including: Infection. Bleeding. Allergic reactions to medicines. Damage to other structures or organs. Pain. Constipation. Difficulty passing urine. Narrowing of the  anal canal (stenosis). Difficulty controlling bowel movements (incontinence). Recurring hemorrhoids. A new passage (fistula) that forms between the anus or rectum and another area. What happens before the procedure? Medicines Ask your health care provider about: Changing or stopping your regular medicines. This is especially important if you are taking diabetes medicines or blood thinners. Taking medicines such as aspirin and ibuprofen. These medicines can thin your blood. Do not take these medicines unless your health care provider tells you to take them. Taking over-the-counter medicines, vitamins, herbs, and supplements. Staying hydrated Follow instructions from your health care provider about hydration, which may include: Up to 2 hours before the procedure - you may continue to drink clear liquids, such as water, clear fruit juice, black coffee, and plain tea.  Eating and drinking Follow instructions from your health care provider about eating and drinking, which may include: 8 hours before the procedure - stop eating heavy meals or foods, such as meat, fried foods, or fatty foods. 6 hours before the procedure - stop eating light meals or foods, such as toast or cereal. 6 hours before the procedure - stop drinking milk or drinks that contain milk. 2 hours before the procedure - stop drinking clear liquids. General instructions You may need to have a procedure to examine the inside of your colon with a scope (colonoscopy). Your health care provider may do this to make sure that there are no other causes for your bleeding or pain. You may be instructed to take a laxative and an enema to clean out your colon before surgery (bowel prep). Carefully follow instructions from your health care provider about bowel prep. Plan to have someone take you home from the hospital or clinic. Plan to have a responsible adult care for you for at least 24 hours   after you leave the hospital or clinic. This is  important. Ask your health care provider: How your surgery site will be marked. What steps will be taken to help prevent infection. These may include: Washing skin with a germ-killing soap. Taking antibiotic medicine. What happens during the procedure? An IV will be inserted into one of your veins. You will be given one or more of the following: A medicine to help you relax (sedative). A medicine to numb the area (local anesthetic). A medicine to make you fall asleep (general anesthetic). A medicine that is injected into an area of your body to numb everything below the injection site (regional anesthetic). A lubricating jelly may be placed into your rectum. Your surgeon will insert a short scope (anoscope) into your rectum to examine the hemorrhoids. One of the following surgical methods will be used to remove the hemorrhoids: Closed hemorrhoidectomy. Your surgeon will use surgical instruments to open the tissue around the hemorrhoids. The veins that supply the hemorrhoids will be tied off with a suture. The hemorrhoids will be removed. The tissue that surrounds the hemorrhoids will be closed with sutures that your body can absorb (absorbable sutures). Open hemorrhoidectomy. The hemorrhoids will be removed with surgical instruments. The incisions will be left open to heal without sutures. Stapled hemorrhoidectomy. Your surgeon will use a circular stapling device to partially remove the hemorrhoids. The device will be inserted into your anus. It will remove a circular ring of tissue that includes hemorrhoid tissue and some tissue above the hemorrhoids. The staples in the device will close the edges of the tissue. This will cut off the blood supply to any remaining hemorrhoids and pull the tissue back into place. Each of these procedures may vary among health care providers and hospitals. What happens after the procedure? Your blood pressure, heart rate, breathing rate, and blood oxygen  level may be monitored until you leave the hospital or clinic. You will be given pain medicine as needed. Do not drive for 24 hours if you were given a sedative during your procedure. Summary Surgery may be needed for hemorrhoids if diet changes, lifestyle changes, and other treatments do not help your symptoms. There are three common methods of surgery that are used to treat hemorrhoids. Follow instructions from your health care provider about taking medicines and about eating and drinking before the procedure. You may be instructed to take a laxative and an enema to clean out your colon before surgery (bowel prep). This information is not intended to replace advice given to you by your health care provider. Make sure you discuss any questions you have with your health care provider. Document Revised: 02/05/2021 Document Reviewed: 02/05/2021 Elsevier Patient Education  2023 [employer]  

## 2022-01-13 NOTE — Progress Notes (Signed)
Patient ID: Cody [name], [name]   DOB: 09/17/1970, 51 y.o.   MRN: [id number]  Chief Complaint: Anal/hemorrhoidal pain  History of Present Illness Cody [name] with minimal prior history of hemorrhoids over the years.  About 6 and half weeks ago with some episodes of diarrhea he began having severe pain.  No prior history of thrombosis, but found a external mass that was quite tender.  He has been maintaining soft stools and not straining.  Though previously with chronic opioids he has a history of constipation.  But this is long since resolved.  He denies any blood in his stools but has noted little bit of bright red on wiping.  Its mostly pain and swelling with pain during actual bowel activity.  He is utilized warm showers, witch hazel, Proctofoam and Preparation H.  He currently does not take any known fiber supplements or bowel regimen.  He denies any family history of colon cancer.  His last colonoscopy was 10 years ago.  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; herniated disc L5- S1   Degenerative disk disease    Depression    Fracture, mandible (HCC)    Fracture, tibia    Hyperlipidemia    Hypothyroidism 05/2007   Dr William Hopper (retired)/ Dr Graham Duncan   PTSD (post-traumatic stress disorder)    suicide attempt X1, gestureX 1      Past Surgical History:  Procedure Laterality Date   APPENDECTOMY     peritonitis   INTRATHECAL PUMP IMPLANTATION  2007   LAMINECTOMY  2004   arterial injury, transfused 7 units pc, implant surgery    MANDIBLE FRACTURE SURGERY     mugged   THORACIC DISCECTOMY     T10   UPPER GASTROINTESTINAL ENDOSCOPY  2006   gastritis    Allergies  Allergen Reactions   Neurontin  [Gabapentin]     lethargic   Vancomycin     Swelling    Doxycycline     vomiting   Lithium     tremor   Mirtazapine     REACTION: lethargy ( Remeron)    Current Outpatient Medications  Medication Sig Dispense Refill   ALPRAZolam (XANAX) 0.5 MG tablet Take 0.5 mg by mouth at bedtime as needed.     AMBULATORY NON FORMULARY MEDICATION Morphine Pump Implant     atorvastatin (LIPITOR) 10 MG tablet Take 1 tablet by mouth at bedtime.     busPIRone (BUSPAR) 10 MG tablet Take 1 tablet (10 mg total) by mouth 2 (two) times daily.     CAPLYTA 42 MG capsule Take 42 mg by mouth daily.     Cholecalciferol (VITAMIN D) 50 MCG (2000 UT) CAPS Take 1 capsule by mouth daily.     escitalopram (LEXAPRO) 20 MG tablet Take 1 tablet (20 mg total) by mouth daily.     Galcanezumab-gnlm (EMGALITY) 120 MG/ML SOAJ Inject 120 mg into the skin every 30 (thirty) days. 3 mL 3   hydrocortisone-pramoxine (PROCTOFOAM HC) rectal foam Place 1 applicator rectally 2 (two) times daily. Over the weekend until you see GI 10 g 0   HYDROmorphone (DILAUDID) 2 MG   tablet Take 2 mg by mouth 4 (four) times daily as needed.     hydrOXYzine (ATARAX/VISTARIL) 50 MG tablet Take 50 mg by mouth 2 (two) times daily.     lamoTRIgine (LAMICTAL) 200 MG tablet Take 1 tablet (200 mg total) by mouth 2 (two) times daily.     levothyroxine (SYNTHROID) 75 MCG tablet TAKE 1 TABLET BY MOUTH DAILY  BEFORE BREAKFAST 90 tablet 3   NEEDLE, DISP, 18 G (BD SAFETYGLIDE NEEDLE) 18G X 1-1/2" MISC Draw up 50 each 0   Omega-3 1000 MG CAPS Take 2 each by mouth daily.     polyethylene glycol powder (GLYCOLAX/MIRALAX) 17 GM/SCOOP powder Take 17 g by mouth every other day as needed.     promethazine (PHENERGAN) 25 MG tablet TAKE 1/2 TO 1 TABLET BY MOUTH EVERY 8 HOURS AS NEEDED FOR NAUSEA AND VOMITING 20 tablet 1   SUMAtriptan (IMITREX) 100 MG tablet Take 1 tablet (100 mg total) by mouth every 2 (two) hours as needed for migraine (max 2 doses in 24 hours.). May repeat  in 2 hours if headache persists or recurs. 20 tablet 4   SYRINGE-NEEDLE, DISP, 3 ML (B-D 3CC LUER-LOK SYR 21GX1-1/2) 21G X 1-1/2" 3 ML MISC Use this needle to injection 50 each 0   tadalafil (CIALIS) 5 MG tablet Take 1 tablet (5 mg total) by mouth daily as needed. 90 tablet 3   testosterone cypionate (DEPOTESTOSTERONE CYPIONATE) 200 MG/ML injection INJECT 0.5 MLS (100 MG TOTAL) INTO THE MUSCLE EVERY 14 (FOURTEEN) DAYS. 2 mL 0   tiZANidine (ZANAFLEX) 4 MG tablet Take 4 mg by mouth 3 (three) times daily.     topiramate (TOPAMAX) 25 MG tablet Take by mouth.     No current facility-administered medications for this visit.    Family History Family History  Problem Relation Age of Onset   Ulcers Mother    Depression Mother    Thyroid disease Mother        hypo   Stroke Father 21   Thyroid disease Father        hypo   Lung cancer Maternal Grandmother    Thyroid disease Maternal Grandmother        hypo   Colon cancer Maternal Grandfather    Heart attack Maternal Grandfather 40   Esophageal cancer Maternal Grandfather    Diabetes Neg Hx    Prostate cancer Neg Hx    Kidney cancer Neg Hx    Bladder Cancer Neg Hx    Migraines Neg Hx    Headache Neg Hx       Social History Social History   Tobacco Use   Smoking status: Former    Packs/day: 0.25    Types: E-cigarettes, Cigarettes    Quit date: 08/10/2009    Years since quitting: 12.4    Passive exposure: Never   Smokeless tobacco: Never   Tobacco comments:    smoked age intermittently age [age]; up to 2 cigarettes/ day   Vaping Use   Vaping Use: Never used  Substance Use Topics   Alcohol use: No   Drug use: No        Review of Systems  Constitutional: Negative.   HENT: Negative.    Eyes: Negative.   Respiratory: Negative.    Cardiovascular: Negative.   Gastrointestinal:  Positive for diarrhea.  Genitourinary: Negative.   Skin: Negative.   Neurological: Negative.   Psychiatric/Behavioral:  Positive for  depression.      Physical Exam Blood pressure 124/87,  pulse 99, temperature 98 F (36.7 C), height 5\' 5"  (1.651 m), weight 184 lb 9.6 oz (83.7 kg), SpO2 97 %. Last Weight  Most recent update: 01/13/2022 11:21 AM    Weight  83.7 kg (184 lb 9.6 oz)             CONSTITUTIONAL: Well developed, and nourished, appropriately responsive and aware without distress.   EYES: Sclera non-icteric.   EARS, NOSE, MOUTH AND THROAT:  The oropharynx is clear. Oral mucosa is pink and moist.   Hearing is intact to voice.  NECK: Trachea is midline, and there is no jugular venous distension.  LYMPH NODES:  Lymph nodes in the neck are not enlarged. RESPIRATORY:  Lungs are clear, and breath sounds are equal bilaterally. Normal respiratory effort without pathologic use of accessory muscles. CARDIOVASCULAR: Heart is regular in rate and rhythm. GI: The abdomen is soft, nontender, and nondistended.  GU: Prominent mid left sided external hemorrhoid, chronically thrombosed without evidence of ulceration.  Tender to touch with extension internally with the same column.  No other hemorrhoidal piles or redundancy appreciated on limited exam due to tolerance.  No evidence of fissure, fistula or abscess present.  No sentinel tags or others external hemorrhoidal tags noted. MUSCULOSKELETAL:  Symmetrical muscle tone appreciated in all four extremities.    SKIN: Skin turgor is normal. No pathologic skin lesions appreciated.  NEUROLOGIC:  Motor and sensation appear grossly normal.  Cranial nerves are grossly without defect. PSYCH:  Alert and oriented to person, place and time. Affect is appropriate for situation.  Data Reviewed I have personally reviewed what is currently available of the patient's imaging, recent labs and medical records.   Labs:     Latest Ref Rng & Units 11/11/2021   10:41 AM 09/12/2021   10:16 AM 07/23/2021    9:51 AM  CBC  WBC 3.4 - 10.8 x10E3/uL   7.9    Hemoglobin 13.0 - 17.7 g/dL 16.115.6   09.614.7   04.516.8     Hematocrit 37.5 - 51.0 % 47.6   43.8   49.1    Platelets 150 - 450 x10E3/uL   270        Latest Ref Rng & Units 07/23/2021    9:51 AM 03/03/2021   12:56 PM 02/25/2021    8:37 AM  CMP  Glucose 70 - 99 mg/dL 72   409118   [id number]    BUN 6 - 24 mg/dL 11   8   7     Creatinine 0.76 - 1.27 mg/dL 9.141.41   7.821.28   9.561.44    Sodium 134 - 144 mmol/L 141   141   142    Potassium 3.5 - 5.2 mmol/L 4.3   4.7   4.2    Chloride 96 - 106 mmol/L 99   99   100    CO2 20 - 29 mmol/L 23   24   28     Calcium 8.7 - 10.2 mg/dL 9.5   9.4   9.1    Total Protein 6.0 - 8.5 g/dL 7.2    6.9    Total Bilirubin 0.0 - 1.2 mg/dL 0.3    <2.1<0.2    Alkaline Phos 44 - 121 IU/L 104    104    AST 0 - 40 IU/L 23    24    ALT 0 - 44 IU/L 18    17        Imaging:  Within last 24 hrs: No results  found.  Assessment    Thrombosed internal/external hemorrhoid. Anal pain/spasm. Patient Active Problem List   Diagnosis Date Noted   Thrombosed hemorrhoids 01/09/2022   Hypertension 02/24/2021   Vaping nicotine dependence, 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   Chronic migraine with aura 11/12/2020   Chronic migraine without aura without status migrainosus, not intractable 11/12/2020   Nonintractable headache 09/01/2020   Diarrhea 06/16/2020   Medicare annual wellness visit, initial 03/04/2020   Fatty liver 07/05/2019   Obesity with body mass index greater than 30 04/27/2018   Advance care planning 09/15/2015   LLQ pain 09/15/2015   Low serum testosterone level 04/04/2011   Vitamin D deficiency 05/01/2010   CIGARETTE SMOKER 05/01/2010   HLD (hyperlipidemia) 04/01/2009   Anxiety state 04/01/2009   Bipolar affective (HCC) 04/01/2009   FASTING HYPERGLYCEMIA 04/01/2009   Hypothyroidism 04/07/2007   SYNDROME, CHRONIC PAIN 04/07/2007    Plan    Rectal exam under anesthesia, hemorrhoidectomy.  Risks reviewed include but not limited to anesthesia, bleeding, recurrence, anal stenosis, spasm, diminished function or potential soilage,  etc.  Questions answered, no guarantees expressed or implied.   Face-to-face time spent with the patient and accompanying care providers(if present) was 30 minutes, with more than 50% of the time spent counseling, educating, and coordinating care of the patient.    These notes generated with voice recognition software. I apologize for typographical errors.  Campbell Lerner M.D., FACS 01/13/2022, 12:12 PM

## 2022-01-15 ENCOUNTER — Ambulatory Visit: Payer: Self-pay | Admitting: Surgery

## 2022-01-22 ENCOUNTER — Encounter
Admission: RE | Admit: 2022-01-22 | Discharge: 2022-01-22 | Disposition: A | Discharge status: Home / Self Care | Payer: BC Managed Care – PPO | Source: Ambulatory Visit | Attending: Surgery | Admitting: Surgery

## 2022-01-22 ENCOUNTER — Other Ambulatory Visit: Payer: Self-pay

## 2022-01-22 HISTORY — DX: Other complications of anesthesia, initial encounter: T88.59XA

## 2022-01-22 HISTORY — DX: Myoneural disorder, unspecified: G70.9

## 2022-01-22 HISTORY — DX: Headache, unspecified: R51.9

## 2022-01-22 HISTORY — DX: Other symptoms and signs involving emotional state: R45.89

## 2022-01-22 NOTE — Patient Instructions (Addendum)
Your procedure is scheduled on: 01/28/22 - Wednesday Report to the Registration Desk on the 1st floor of the [facility]. To find out your arrival time, please call [phone] between 1PM - 3PM on: 01/27/22 - Tuesday If your arrival time is 6:00 am, do not arrive prior to that time as the [facility] entrance doors do not open until 6:00 am.  REMEMBER: Instructions that are not followed completely may result in serious medical risk, up to and including death; or upon the discretion of your surgeon and anesthesiologist your surgery may need to be rescheduled.  Do not eat food after midnight the night before surgery.  No gum chewing, lozengers or hard candies.  TAKE THESE MEDICATIONS THE MORNING OF SURGERY WITH A SIP OF WATER:  - levothyroxine (SYNTHROID) 75 MCG tablet - lamoTRIgine (LAMICTAL) 200 MG tablet - busPIRone (BUSPAR) 10 MG tablet - HYDROmorphone (DILAUDID) 2 MG tablet if needed  Fleets enema - 1 enema the night before your procedure and do 1 enema on the morning of your procedure.  One week prior to surgery: Stop Anti-inflammatories (NSAIDS) such as Advil, Aleve, Ibuprofen, Motrin, Naproxen, Naprosyn and Aspirin based products such as Excedrin, Goodys Powder, BC Powder.  Stop ANY OVER THE COUNTER supplements until after surgery.Omega-3 1000 MG , Cholecalciferol (VITAMIN D) 50 , CBD Gummies.  You may take Tylenol if needed for pain up until the day of surgery.  No Alcohol for 24 hours before or after surgery.  No Smoking including e-cigarettes for 24 hours prior to surgery.  No chewable tobacco products for at least 6 hours prior to surgery.  No nicotine patches on the day of surgery.  Do not use any "recreational" drugs for at least a week prior to your surgery.  Please be advised that the combination of cocaine and anesthesia may have negative outcomes, up to and including death. If you test positive for cocaine, your surgery will be cancelled.  On the morning of  surgery brush your teeth with toothpaste and water, you may rinse your mouth with mouthwash if you wish. Do not swallow any toothpaste or mouthwash.  Do not wear jewelry, make-up, hairpins, clips or nail polish.  Do not wear lotions, powders, or perfumes.   Do not shave body from the neck down 48 hours prior to surgery just in case you cut yourself which could leave a site for infection.  Also, freshly shaved skin may become irritated if using the CHG soap.  Contact lenses, hearing aids and dentures may not be worn into surgery.  Do not bring valuables to the hospital. [facility] is not responsible for any missing/lost belongings or valuables.   Notify your doctor if there is any change in your medical condition (cold, fever, infection).  Wear comfortable clothing (specific to your surgery type) to the hospital.  After surgery, you can help prevent lung complications by doing breathing exercises.  Take deep breaths and cough every 1-2 hours. Your doctor may order a device called an Incentive Spirometer to help you take deep breaths. When coughing or sneezing, hold a pillow firmly against your incision with both hands. This is called "splinting." Doing this helps protect your incision. It also decreases belly discomfort.  If you are being admitted to the hospital overnight, leave your suitcase in the car. After surgery it may be brought to your room.  If you are being discharged the day of surgery, you will not be allowed to drive home. You will need a responsible  adult (18 years or older) to drive you home and stay with you that night.   If you are taking public transportation, you will need to have a responsible adult (18 years or older) with you. Please confirm with your physician that it is acceptable to use public transportation.   Please call the Pre-admissions Testing Dept. at [phone] if you have any questions about these instructions.  Surgery Visitation  Policy:  Patients undergoing a surgery or procedure may have two family members or support persons with them as long as the person is not COVID-19 positive or experiencing its symptoms.   Inpatient Visitation:    Visiting hours are 7 a.m. to 8 p.m. Up to four visitors are allowed at one time in a patient room, including children. The visitors may rotate out with other people during the day. One designated support person (adult) may remain overnight.

## 2022-01-26 DIAGNOSIS — M5417 Radiculopathy, lumbosacral region: Secondary | ICD-10-CM | POA: Diagnosis not present

## 2022-01-26 DIAGNOSIS — G894 Chronic pain syndrome: Secondary | ICD-10-CM | POA: Diagnosis not present

## 2022-01-26 DIAGNOSIS — Z978 Presence of other specified devices: Secondary | ICD-10-CM | POA: Diagnosis not present

## 2022-01-27 ENCOUNTER — Other Ambulatory Visit: Payer: Self-pay | Admitting: Family Medicine

## 2022-01-27 MED ORDER — CHLORHEXIDINE GLUCONATE CLOTH 2 % EX PADS: 6.0000 | MEDICATED_PAD | Freq: Once | CUTANEOUS | Status: AC

## 2022-01-27 MED ORDER — CHLORHEXIDINE GLUCONATE 0.12 % MT SOLN
15.0000 mL | Freq: Once | OROMUCOSAL | Status: AC
  Administered 2022-01-28: 15 mL via OROMUCOSAL

## 2022-01-27 MED ORDER — LACTATED RINGERS IV SOLN: INTRAVENOUS | Status: DC

## 2022-01-27 MED ORDER — FLEET ENEMA 7-19 GM/118ML RE ENEM
1.0000 | ENEMA | Freq: Once | RECTAL | Status: AC
  Administered 2022-01-28: 1 via RECTAL

## 2022-01-27 MED ORDER — CHLORHEXIDINE GLUCONATE CLOTH 2 % EX PADS
6.0000 | MEDICATED_PAD | Freq: Once | CUTANEOUS | Status: AC
  Administered 2022-01-28: 6 via TOPICAL

## 2022-01-27 MED ORDER — FAMOTIDINE 20 MG PO TABS
20.0000 mg | ORAL_TABLET | Freq: Once | ORAL | Status: AC
  Administered 2022-01-28: 20 mg via ORAL

## 2022-01-27 MED ORDER — ORAL CARE MOUTH RINSE: 15.0000 mL | Freq: Once | OROMUCOSAL | Status: AC

## 2022-01-27 MED ORDER — ACETAMINOPHEN 500 MG PO TABS
1000.0000 mg | ORAL_TABLET | ORAL | Status: AC
  Administered 2022-01-28: 1000 mg via ORAL

## 2022-01-27 MED ORDER — CELECOXIB 200 MG PO CAPS
200.0000 mg | ORAL_CAPSULE | ORAL | Status: AC
  Administered 2022-01-28: 200 mg via ORAL

## 2022-01-27 MED ORDER — BUPIVACAINE LIPOSOME 1.3 % IJ SUSP: 20.0000 mL | Freq: Once | INTRAMUSCULAR | Status: DC

## 2022-01-28 ENCOUNTER — Ambulatory Visit: Payer: BC Managed Care – PPO | Admitting: Urgent Care

## 2022-01-28 ENCOUNTER — Ambulatory Visit
Admission: RE | Admit: 2022-01-28 | Discharge: 2022-01-28 | Disposition: A | Discharge status: Home / Self Care | Payer: BC Managed Care – PPO | Source: Ambulatory Visit | Attending: Surgery | Admitting: Surgery

## 2022-01-28 ENCOUNTER — Encounter
Admission: RE | Disposition: A | Discharge status: Home / Self Care | Payer: Self-pay | Source: Ambulatory Visit | Attending: Surgery

## 2022-01-28 ENCOUNTER — Other Ambulatory Visit: Payer: Self-pay

## 2022-01-28 ENCOUNTER — Encounter: Payer: Self-pay | Admitting: Surgery

## 2022-01-28 DIAGNOSIS — K641 Second degree hemorrhoids: Secondary | ICD-10-CM | POA: Insufficient documentation

## 2022-01-28 DIAGNOSIS — K648 Other hemorrhoids: Secondary | ICD-10-CM | POA: Diagnosis not present

## 2022-01-28 DIAGNOSIS — K645 Perianal venous thrombosis: Secondary | ICD-10-CM

## 2022-01-28 DIAGNOSIS — K644 Residual hemorrhoidal skin tags: Secondary | ICD-10-CM | POA: Diagnosis not present

## 2022-01-28 DIAGNOSIS — Z87891 Personal history of nicotine dependence: Secondary | ICD-10-CM | POA: Diagnosis not present

## 2022-01-28 DIAGNOSIS — K594 Anal spasm: Secondary | ICD-10-CM | POA: Diagnosis not present

## 2022-01-28 SURGERY — EXAM UNDER ANESTHESIA WITH HEMORRHOIDECTOMY
Anesthesia: General | Site: Rectum

## 2022-01-28 MED ORDER — KETAMINE HCL 10 MG/ML IJ SOLN
INTRAMUSCULAR | Status: DC | PRN
  Administered 2022-01-28: 50 mg via INTRAVENOUS

## 2022-01-28 MED ORDER — PROMETHAZINE HCL 25 MG/ML IJ SOLN: 6.2500 mg | INTRAMUSCULAR | Status: DC | PRN

## 2022-01-28 MED ORDER — BUPIVACAINE LIPOSOME 1.3 % IJ SUSP
INTRAMUSCULAR | Status: AC
  Filled 2022-01-28: qty 20

## 2022-01-28 MED ORDER — OXYCODONE HCL 5 MG PO TABS
5.0000 mg | ORAL_TABLET | Freq: Once | ORAL | Status: AC
  Administered 2022-01-28: 5 mg via ORAL

## 2022-01-28 MED ORDER — PROPOFOL 10 MG/ML IV BOLUS
INTRAVENOUS | Status: DC | PRN
  Administered 2022-01-28: 300 mg via INTRAVENOUS

## 2022-01-28 MED ORDER — MIDAZOLAM HCL 2 MG/2ML IJ SOLN
INTRAMUSCULAR | Status: AC
  Filled 2022-01-28: qty 2

## 2022-01-28 MED ORDER — DIBUCAINE (PERIANAL) 1 % EX OINT
TOPICAL_OINTMENT | CUTANEOUS | Status: DC | PRN
  Administered 2022-01-28: 1 via RECTAL

## 2022-01-28 MED ORDER — HYDROMORPHONE HCL 1 MG/ML IJ SOLN
INTRAMUSCULAR | Status: AC
  Filled 2022-01-28: qty 1

## 2022-01-28 MED ORDER — IBUPROFEN 800 MG PO TABS: 800.0000 mg | ORAL_TABLET | Freq: Three times a day (TID) | ORAL | 0 refills | Status: DC | PRN

## 2022-01-28 MED ORDER — MIDAZOLAM HCL 2 MG/2ML IJ SOLN
INTRAMUSCULAR | Status: DC | PRN
  Administered 2022-01-28: 2 mg via INTRAVENOUS

## 2022-01-28 MED ORDER — ACETAMINOPHEN 500 MG PO TABS
ORAL_TABLET | ORAL | Status: AC
  Filled 2022-01-28: qty 2

## 2022-01-28 MED ORDER — FENTANYL CITRATE (PF) 100 MCG/2ML IJ SOLN
INTRAMUSCULAR | Status: AC
  Filled 2022-01-28: qty 2

## 2022-01-28 MED ORDER — SUGAMMADEX SODIUM 200 MG/2ML IV SOLN
INTRAVENOUS | Status: DC | PRN
  Administered 2022-01-28: 340 mg via INTRAVENOUS

## 2022-01-28 MED ORDER — KETAMINE HCL 50 MG/5ML IJ SOSY
PREFILLED_SYRINGE | INTRAMUSCULAR | Status: AC
  Filled 2022-01-28: qty 5

## 2022-01-28 MED ORDER — FENTANYL CITRATE (PF) 100 MCG/2ML IJ SOLN
25.0000 ug | INTRAMUSCULAR | Status: DC | PRN
  Administered 2022-01-28: 50 ug via INTRAVENOUS

## 2022-01-28 MED ORDER — BUPIVACAINE-EPINEPHRINE (PF) 0.25% -1:200000 IJ SOLN
INTRAMUSCULAR | Status: AC
  Filled 2022-01-28: qty 30

## 2022-01-28 MED ORDER — CHLORHEXIDINE GLUCONATE 0.12 % MT SOLN
OROMUCOSAL | Status: AC
  Filled 2022-01-28: qty 15

## 2022-01-28 MED ORDER — DIBUCAINE (PERIANAL) 1 % EX OINT
TOPICAL_OINTMENT | CUTANEOUS | Status: AC
  Filled 2022-01-28: qty 28

## 2022-01-28 MED ORDER — OXYCODONE HCL 5 MG PO TABS
ORAL_TABLET | ORAL | Status: AC
  Filled 2022-01-28: qty 1

## 2022-01-28 MED ORDER — FENTANYL CITRATE (PF) 100 MCG/2ML IJ SOLN
INTRAMUSCULAR | Status: DC | PRN
  Administered 2022-01-28: 100 ug via INTRAVENOUS

## 2022-01-28 MED ORDER — LIDOCAINE HCL (CARDIAC) PF 100 MG/5ML IV SOSY
PREFILLED_SYRINGE | INTRAVENOUS | Status: DC | PRN
  Administered 2022-01-28: 50 mg via INTRAVENOUS

## 2022-01-28 MED ORDER — GELATIN ABSORBABLE 100 CM EX MISC
CUTANEOUS | Status: AC
  Filled 2022-01-28: qty 1

## 2022-01-28 MED ORDER — FAMOTIDINE 20 MG PO TABS
ORAL_TABLET | ORAL | Status: AC
  Filled 2022-01-28: qty 1

## 2022-01-28 MED ORDER — HYDROMORPHONE HCL 1 MG/ML IJ SOLN
INTRAMUSCULAR | Status: DC | PRN
  Administered 2022-01-28: .25 mg via INTRAVENOUS

## 2022-01-28 MED ORDER — BUPIVACAINE-EPINEPHRINE (PF) 0.25% -1:200000 IJ SOLN
INTRAMUSCULAR | Status: DC | PRN
  Administered 2022-01-28: 50 mL

## 2022-01-28 MED ORDER — LACTATED RINGERS IV SOLN: INTRAVENOUS | Status: DC | PRN

## 2022-01-28 MED ORDER — 0.9 % SODIUM CHLORIDE (POUR BTL) OPTIME
TOPICAL | Status: DC | PRN
  Administered 2022-01-28: 500 mL

## 2022-01-28 MED ORDER — ROCURONIUM BROMIDE 100 MG/10ML IV SOLN
INTRAVENOUS | Status: DC | PRN
  Administered 2022-01-28: 50 mg via INTRAVENOUS

## 2022-01-28 MED ORDER — FENTANYL CITRATE (PF) 100 MCG/2ML IJ SOLN
INTRAMUSCULAR | Status: AC
  Administered 2022-01-28: 50 ug via INTRAVENOUS
  Filled 2022-01-28: qty 2

## 2022-01-28 MED ORDER — GELATIN ABSORBABLE 12-7 MM EX MISC
CUTANEOUS | Status: DC | PRN
  Administered 2022-01-28: 1

## 2022-01-28 MED ORDER — OXYCODONE HCL 5 MG PO TABS: 5.0000 mg | ORAL_TABLET | Freq: Four times a day (QID) | ORAL | 0 refills | Status: DC | PRN

## 2022-01-28 MED ORDER — CELECOXIB 200 MG PO CAPS
ORAL_CAPSULE | ORAL | Status: AC
  Filled 2022-01-28: qty 1

## 2022-01-28 SURGICAL SUPPLY — 29 items
BLADE SURG 15 STRL LF DISP TIS (BLADE) ×1 IMPLANT
BLADE SURG 15 STRL SS (BLADE) ×2
DRAPE LAPAROTOMY 100X77 ABD (DRAPES) ×2 IMPLANT
DRSG GAUZE FLUFF 36X18 (GAUZE/BANDAGES/DRESSINGS) ×2 IMPLANT
ELECT CAUTERY BLADE TIP 2.5 (TIP) ×2
ELECT REM PT RETURN 9FT ADLT (ELECTROSURGICAL) ×2
ELECTRODE CAUTERY BLDE TIP 2.5 (TIP) ×1 IMPLANT
ELECTRODE REM PT RTRN 9FT ADLT (ELECTROSURGICAL) ×1 IMPLANT
GAUZE 4X4 16PLY ~~LOC~~+RFID DBL (SPONGE) ×2 IMPLANT
GLOVE ORTHO TXT STRL SZ7.5 (GLOVE) ×2 IMPLANT
GOWN STRL REUS W/ TWL LRG LVL3 (GOWN DISPOSABLE) ×1 IMPLANT
GOWN STRL REUS W/ TWL XL LVL3 (GOWN DISPOSABLE) ×1 IMPLANT
GOWN STRL REUS W/TWL LRG LVL3 (GOWN DISPOSABLE) ×2
GOWN STRL REUS W/TWL XL LVL3 (GOWN DISPOSABLE) ×2
KIT TURNOVER KIT A (KITS) ×2 IMPLANT
MANIFOLD NEPTUNE II (INSTRUMENTS) ×2 IMPLANT
NEEDLE HYPO 22GX1.5 SAFETY (NEEDLE) ×2 IMPLANT
PACK BASIN MINOR ARMC (MISCELLANEOUS) ×2 IMPLANT
PAD ABD DERMACEA PRESS 5X9 (GAUZE/BANDAGES/DRESSINGS) IMPLANT
PANTS MESH DISP 2XL (UNDERPADS AND DIAPERS) ×2 IMPLANT
SHEARS HARMONIC 9CM CVD (BLADE) IMPLANT
SOL PREP PVP 2OZ (MISCELLANEOUS) ×2
SOLUTION PREP PVP 2OZ (MISCELLANEOUS) ×1 IMPLANT
SURGILUBE 2OZ TUBE FLIPTOP (MISCELLANEOUS) ×2 IMPLANT
SUT CHROMIC 3 0 SH 27 (SUTURE) ×3 IMPLANT
SWABSTK COMLB BENZOIN TINCTURE (MISCELLANEOUS) ×2 IMPLANT
SYR 10ML LL (SYRINGE) ×2 IMPLANT
TAPE CLOTH 3X10 WHT NS LF (GAUZE/BANDAGES/DRESSINGS) ×4 IMPLANT
WATER STERILE IRR 500ML POUR (IV SOLUTION) ×2 IMPLANT

## 2022-01-28 NOTE — Anesthesia Procedure Notes (Signed)
Procedure Name: Intubation Date/Time: 01/28/2022 1:22 PM  Performed by: Philbert Riser, CRNAPre-anesthesia Checklist: Patient identified, Patient being monitored, Timeout performed, Emergency Drugs available and Suction available Patient Re-evaluated:Patient Re-evaluated prior to induction Oxygen Delivery Method: Circle system utilized Preoxygenation: Pre-oxygenation with 100% oxygen Induction Type: IV induction Ventilation: Mask ventilation without difficulty Laryngoscope Size: McGraph and 4 Grade View: Grade I Tube type: Oral Tube size: 7.5 mm Number of attempts: 1 Airway Equipment and Method: Stylet Placement Confirmation: ETT inserted through vocal cords under direct vision, positive ETCO2 and breath sounds checked- equal and bilateral Secured at: 21 cm Tube secured with: Tape Dental Injury: Teeth and Oropharynx as per pre-operative assessment

## 2022-01-28 NOTE — Interval H&P Note (Signed)
History and Physical Interval Note:  01/28/2022 12:50 PM  Cody [name]  has presented today for surgery, with the diagnosis of Thrombosed hemorrhoids, anal spasm.  The various methods of treatment have been discussed with the patient and family. After consideration of risks, benefits and other options for treatment, the patient has consented to  Procedure(s): EXAM UNDER ANESTHESIA WITH HEMORRHOIDECTOMY (N/A) as a surgical intervention.  The patient's history has been reviewed, patient examined, no change in status, stable for surgery.  I have reviewed the patient's chart and labs.  Questions were answered to the patient's satisfaction.     Campbell Lerner

## 2022-01-28 NOTE — Transfer of Care (Signed)
Immediate Anesthesia Transfer of Care Note  Patient: Cody [name]  Procedure(s) Performed: EXAM UNDER ANESTHESIA WITH HEMORRHOIDECTOMY (Rectum)  Patient Location: PACU  Anesthesia Type:General  Level of Consciousness: drowsy  Airway & Oxygen Therapy: Patient Spontanous Breathing and Patient connected to face mask oxygen  Post-op Assessment: Report given to RN and Post -op Vital signs reviewed and stable  Post vital signs: Reviewed and stable  Last Vitals:  Vitals Value Taken Time  BP    Temp    Pulse    Resp    SpO2      Last Pain:  Vitals:   01/28/22 1140  TempSrc: Temporal  PainSc: 7          Complications: No notable events documented.

## 2022-01-28 NOTE — Anesthesia Preprocedure Evaluation (Signed)
Anesthesia Evaluation  Patient identified by MRN, date of birth, ID band Patient awake    Reviewed: Allergy & Precautions, H&P , NPO status , Patient's Chart, lab work & pertinent test results, reviewed documented beta blocker date and time   History of Anesthesia Complications (+) history of anesthetic complications  Airway Mallampati: II  TM Distance: >3 FB Neck ROM: full    Dental  (+) Dental Advidsory Given, Caps, Implants   Pulmonary neg pulmonary ROS, former smoker,    Pulmonary exam normal breath sounds clear to auscultation       Cardiovascular Exercise Tolerance: Good hypertension, (-) angina(-) Past MI and (-) Cardiac Stents Normal cardiovascular exam(-) dysrhythmias (-) Valvular Problems/Murmurs Rhythm:regular Rate:Normal     Neuro/Psych neg Seizures PSYCHIATRIC DISORDERS Anxiety Depression Bipolar Disorder  Neuromuscular disease    GI/Hepatic negative GI ROS, Neg liver ROS,   Endo/Other  neg diabetesHypothyroidism   Renal/GU negative Renal ROS  negative genitourinary   Musculoskeletal   Abdominal   Peds  Hematology negative hematology ROS (+)   Anesthesia Other Findings Past Medical History: No date: Anxiety 03/2009: Bipolar 2 disorder (HCC)     Comment:  Dr Caryn Section No date: Chronic back pain No date: Chronic pain syndrome     Comment:  after he had a hematoma from back surgery, "pressure on               spine" No date: Complication of anesthesia     Comment:  WOKE UP SHAKING VIOLENTLY No date: DDD (degenerative disc disease)     Comment:  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; herniated disc L5- S1 No date: Degenerative disk disease No date: Depression No date: Fracture, mandible (HCC) No date: Fracture, tibia No date: Headache No date: Hyperlipidemia No date: Hypertension     Comment:  resolved 05/2007: Hypothyroidism     Comment:  Dr Marga Melnick (retired)/ Dr Crawford Givens No date: Neuromuscular  disorder (HCC) No date: PTSD (post-traumatic stress disorder) No date: Suicidal behavior     Comment:  a.) ideations with attempt x 1   Reproductive/Obstetrics negative OB ROS                             Anesthesia Physical Anesthesia Plan  ASA: 2  Anesthesia Plan: General   Post-op Pain Management:    Induction: Intravenous  PONV Risk Score and Plan: 2 and Ondansetron, Dexamethasone, Midazolam and Treatment may vary due to age or medical condition  Airway Management Planned: Oral ETT  Additional Equipment:   Intra-op Plan:   Post-operative Plan: Extubation in OR  Informed Consent: I have reviewed the patients History and Physical, chart, labs and discussed the procedure including the risks, benefits and alternatives for the proposed anesthesia with the patient or authorized representative who has indicated his/her understanding and acceptance.     Dental Advisory Given  Plan Discussed with: Anesthesiologist, CRNA and Surgeon  Anesthesia Plan Comments:         Anesthesia Quick Evaluation

## 2022-01-28 NOTE — Discharge Instructions (Signed)

## 2022-01-28 NOTE — Op Note (Signed)
SURGICAL OPERATIVE REPORT   DATE OF PROCEDURE: 01/28/2022  ATTENDING Surgeon(s): Campbell Lerner, MD  ANESTHESIA: GETA  PRE-OPERATIVE DIAGNOSIS: Symptomatic (painful) 2nd degree internal and external hemorrhoid with symptoms not well-controlled despite medical management  POST-OPERATIVE DIAGNOSIS: Same  PROCEDURE(S):  1.) Anoscopy 2.) Two Column hemorrhoidectomy left internal and external, right 2nd degree internal hemorrhoid (cpt: 46260)  INTRAOPERATIVE FINDINGS: 2nd degree right sided internal and left internal with thrombosed external hemorrhoid.  ESTIMATED BLOOD LOSS: 10 mL  URINE OUTPUT: No foley   SPECIMENS: Hemorrhoids  COMPLICATIONS: None apparent   CONDITION AT END OF PROCEDURE: Hemodynamically stable and extubated   DISPOSITION OF PATIENT: PACU   DETAILS OF PROCEDURE: Patient was brought to the operative suite and appropriately identified. General  anesthesia was administered along with appropriate pre-operative antibiotics, and endotracheal intubation was performed by anesthetist.  Repositioned to prone/jackknife and buttocks taped for exposure, operative site was prepped and draped in the usual sterile fashion, and following a brief time out, rigid anoscopy was performed with findings of no distal rectal masses, normal mucosa,  prominent areas involving  posterior right and left quadrants. Local anesthetic Exparel mixed with quarter percent Marcaine with epinephrine was injected, and two hemorrhoidal pedicles were identified. An Alice clamp was placed near the base of each pedicle near the dentate line and retracted externally to exteriorize the hemorrhoidal pedicle. Retracted hemorrhoid was carefully dissected from underlying/adherent tissues, and electrocautery was advanced across the base of the hemorrhoidal pedicle, and the above was repeated for all 2 internal/external hemorrhoids. The underlying internal sphincteric muscle fibers were preserved.  Hemostasis was  achieved/confirmed, a hemostatic locking 3-0 chromic suture is applied in running manner from proximally to distally, leaving the distal aspect open, and a Surgifoam is saturated with Nupercainal ointment was inserted into the anal canal for additional hemostasis and pain control. Fluffs, ABD, and mesh briefs were applied.  Patient was then safely able to be extubated, awakened, and transferred to PACU for post-operative monitoring and care.  Campbell Lerner, M.D., [facility]  01/28/2022 ; 2:31 PM

## 2022-01-29 ENCOUNTER — Encounter: Payer: Self-pay | Admitting: Surgery

## 2022-01-30 LAB — SURGICAL PATHOLOGY

## 2022-02-01 ENCOUNTER — Encounter: Payer: Self-pay | Admitting: Emergency Medicine

## 2022-02-01 ENCOUNTER — Ambulatory Visit
Admission: EM | Admit: 2022-02-01 | Discharge: 2022-02-01 | Disposition: A | Discharge status: Home / Self Care | Payer: Medicare Other | Attending: Urgent Care | Admitting: Urgent Care

## 2022-02-01 DIAGNOSIS — A46 Erysipelas: Secondary | ICD-10-CM | POA: Diagnosis not present

## 2022-02-01 DIAGNOSIS — B3749 Other urogenital candidiasis: Secondary | ICD-10-CM | POA: Diagnosis not present

## 2022-02-01 MED ORDER — CEFTRIAXONE SODIUM 1 G IJ SOLR
1.0000 g | Freq: Once | INTRAMUSCULAR | Status: AC
  Administered 2022-02-01: 1 g via INTRAMUSCULAR

## 2022-02-01 MED ORDER — CEPHALEXIN 500 MG PO CAPS: 500.0000 mg | ORAL_CAPSULE | Freq: Four times a day (QID) | ORAL | 0 refills | Status: DC

## 2022-02-01 MED ORDER — FLUCONAZOLE 200 MG PO TABS: 200.0000 mg | ORAL_TABLET | Freq: Every day | ORAL | 0 refills | Status: DC

## 2022-02-01 NOTE — ED Provider Notes (Signed)
Cody [name]    CSN: [id number] Arrival date & time: 02/01/22  1450      History   Chief Complaint Chief Complaint  Patient presents with   Rash    Rash appeared after hemorrhoidectomy - Entered by patient    HPI Cody [name] is a 51 y.o. male.   50yo male presents today with concerns of a painful rash on his buttock and scrotum. He had a hemorrhoidectomy on 01/28/22. One to two days after the surgery, he noted a rash starting on his buttocks which has since spread to his groin. He states it is both painful 10/10 and itchy. He tried OTC hydrocortisone cream one time without relief. He has been changing the dressing to the surgical site of his L anal area which continues to drain. Pt denies mass in his anus and no systemic sx such as fever, abdominal pain, myalgias, or weakness. He had been doing sitz baths as directed by his physician, but stopped them yesterday as he thought this may have been the cause of the worsening symptoms. Pt has had a bowel movement that was soft, but painful.   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; herniated disc L5- S1   Degenerative disk disease    Depression    Fracture, mandible (HCC)    Fracture, tibia    Headache    Hyperlipidemia    Hypertension    resolved   Hypothyroidism 05/2007   Dr Marga Melnick (retired)/ Dr Crawford Givens   Neuromuscular disorder [facility])    PTSD (post-traumatic stress disorder)    Suicidal behavior    a.) ideations with attempt x 1    Patient Active Problem List   Diagnosis Date Noted   Anal spasm 01/13/2022   Thrombosed hemorrhoids 01/09/2022   Hypertension 02/24/2021   Vaping nicotine dependence,  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   Chronic migraine with aura 11/12/2020   Chronic migraine without aura without status migrainosus, not intractable 11/12/2020   Nonintractable headache 09/01/2020   Diarrhea 06/16/2020   Medicare annual wellness visit, initial 03/04/2020   Fatty liver 07/05/2019   Obesity with body mass index greater than 30 04/27/2018   Advance care planning 09/15/2015   LLQ pain 09/15/2015   Low serum testosterone level 04/04/2011   Vitamin D deficiency 05/01/2010   CIGARETTE SMOKER 05/01/2010   HLD (hyperlipidemia) 04/01/2009   Anxiety state 04/01/2009   Bipolar affective (HCC) 04/01/2009   FASTING HYPERGLYCEMIA 04/01/2009   Hypothyroidism 04/07/2007   SYNDROME, CHRONIC PAIN 04/07/2007    Past Surgical History:  Procedure Laterality Date   APPENDECTOMY     peritonitis   BACK SURGERY     EVALUATION UNDER ANESTHESIA WITH HEMORRHOIDECTOMY N/A 01/28/2022   Procedure: EXAM UNDER ANESTHESIA WITH HEMORRHOIDECTOMY;  Surgeon: Campbell Lerner, MD;  Location: [facility] ORS;  Service: General;  Laterality: N/A;   FRACTURE SURGERY     INTRATHECAL PUMP IMPLANTATION  2007   LAMINECTOMY  2004   arterial injury, transfused 7 units pc, implant surgery    MANDIBLE FRACTURE SURGERY     mugged   THORACIC DISCECTOMY  T10   UPPER GASTROINTESTINAL ENDOSCOPY  2006   gastritis       Home Medications    Prior to Admission medications   Medication Sig Start Date End Date Taking? Authorizing Provider  cephALEXin (KEFLEX) 500 MG capsule Take 1 capsule (500 mg total) by mouth 4 (four) times daily for 7 days. 02/01/22 02/08/22 Yes Russel Morain L, PA  fluconazole (DIFLUCAN) 200 MG tablet Take 1 tablet (200 mg total) by mouth daily for 7 days. 02/01/22 02/08/22 Yes Elianis Fischbach L, PA  acetaminophen (TYLENOL) 500 MG tablet Take 1,000 mg by mouth every 6 (six) hours as needed for mild pain.    [provider]  ALPRAZolam Prudy Feeler) 0.5 MG tablet Take 0.5 mg by mouth at bedtime as  needed.    [provider]  AMBULATORY NON FORMULARY MEDICATION 8.608 mg by Intrathecal Infusion route daily. Morphine Pump Implant    [provider]  atorvastatin (LIPITOR) 10 MG tablet Take 1 tablet (10 mg total) by mouth daily. NEEDS TO SCHEDULE PHYSICAL 01/27/22   Joaquim Nam, MD  busPIRone (BUSPAR) 10 MG tablet Take 1 tablet (10 mg total) by mouth 2 (two) times daily. 03/03/21   Joaquim Nam, MD  CAPLYTA 42 MG capsule Take 42 mg by mouth daily. 11/11/21   [provider]  Cholecalciferol (VITAMIN D) 50 MCG (2000 UT) CAPS Take 1 capsule by mouth daily.    [provider]  escitalopram (LEXAPRO) 20 MG tablet Take 1 tablet (20 mg total) by mouth daily. Patient not taking: Reported on 01/22/2022 03/03/21   Joaquim Nam, MD  Galcanezumab-gnlm [facility]) 120 MG/ML SOAJ Inject 120 mg into the skin every 30 (thirty) days. 05/29/21   Lomax, Amy, NP  hydrocortisone-pramoxine (PROCTOFOAM HC) rectal foam Place 1 applicator rectally 2 (two) times daily. Over the weekend until you see GI Patient taking differently: Place 1 applicator rectally daily. Over the weekend until you see GI 01/09/22   Eden Emms, NP  HYDROmorphone (DILAUDID) 2 MG tablet Take 2 mg by mouth 4 (four) times daily as needed. 08/21/20   [provider]  hydrOXYzine (ATARAX/VISTARIL) 50 MG tablet Take 50 mg by mouth as needed. BID 03/18/20   [provider]  ibuprofen (ADVIL) 800 MG tablet Take 1 tablet (800 mg total) by mouth every 8 (eight) hours as needed. 01/28/22   Campbell Lerner, MD  lamoTRIgine (LAMICTAL) 200 MG tablet Take 1 tablet (200 mg total) by mouth 2 (two) times daily. 04/04/19   Joaquim Nam, MD  levothyroxine (SYNTHROID) 75 MCG tablet TAKE 1 TABLET BY MOUTH DAILY  BEFORE BREAKFAST 07/29/21   Joaquim Nam, MD  NEEDLE, DISP, 18 G (BD SAFETYGLIDE NEEDLE) 18G X 1-1/2" MISC Draw up 11/11/21   Michiel Cowboy A, PA-C  Omega-3 1000 MG CAPS Take 2 each by mouth  daily.    [provider]  OVER THE COUNTER MEDICATION Take 1 Piece by mouth daily. CBD gummy (contains small amount of THC)    [provider]  oxyCODONE (OXY IR/ROXICODONE) 5 MG immediate release tablet Take 1 tablet (5 mg total) by mouth every 6 (six) hours as needed for severe pain. 01/28/22   Campbell Lerner, MD  polyethylene glycol powder (GLYCOLAX/MIRALAX) 17 GM/SCOOP powder Take 17 g by mouth every other day as needed. 03/03/21   Joaquim Nam, MD  promethazine (PHENERGAN) 25 MG tablet TAKE 1/2 TO 1 TABLET BY MOUTH EVERY 8 HOURS AS NEEDED FOR NAUSEA AND VOMITING 01/12/22  Joaquim Nam, MD  SUMAtriptan (IMITREX) 100 MG tablet Take 1 tablet (100 mg total) by mouth every 2 (two) hours as needed for migraine (max 2 doses in 24 hours.). May repeat in 2 hours if headache persists or recurs. 10/09/21   Joaquim Nam, MD  SYRINGE-NEEDLE, DISP, 3 ML (B-D 3CC LUER-LOK SYR 21GX1-1/2) 21G X 1-1/2" 3 ML MISC Use this needle to injection 11/11/21   McGowan, Carollee Herter A, PA-C  tadalafil (CIALIS) 5 MG tablet Take 1 tablet (5 mg total) by mouth daily as needed. 07/11/21   Michiel Cowboy A, PA-C  testosterone cypionate (DEPOTESTOSTERONE CYPIONATE) 200 MG/ML injection INJECT 0.5 MLS (100 MG TOTAL) INTO THE MUSCLE EVERY 14 (FOURTEEN) DAYS. 01/11/22   McGowan, Carollee Herter A, PA-C  tiZANidine (ZANAFLEX) 4 MG tablet Take 4 mg by mouth as needed. 02/17/21   [provider]  topiramate (TOPAMAX) 25 MG tablet Take 50 mg by mouth daily. 12/08/21   [provider]  vortioxetine HBr (TRINTELLIX) 10 MG TABS tablet Take 10 mg by mouth daily.    [provider]    Family History Family History  Problem Relation Age of Onset   Ulcers Mother    Depression Mother    Thyroid disease Mother        hypo   Stroke Father 78   Thyroid disease Father        hypo   Lung cancer Maternal Grandmother    Thyroid disease Maternal Grandmother        hypo   Colon cancer Maternal Grandfather     Heart attack Maternal Grandfather 61   Esophageal cancer Maternal Grandfather    Diabetes Neg Hx    Prostate cancer Neg Hx    Kidney cancer Neg Hx    Bladder Cancer Neg Hx    Migraines Neg Hx    Headache Neg Hx     Social History Social History   Tobacco Use   Smoking status: Former    Packs/day: 0.25    Types: E-cigarettes, Cigarettes    Quit date: 08/10/2009    Years since quitting: 12.4    Passive exposure: Never   Smokeless tobacco: Never   Tobacco comments:    smoked age intermittently age [age]; up to 2 cigarettes/ day   Vaping Use   Vaping Use: Every day   Substances: Nicotine, Flavoring  Substance Use Topics   Alcohol use: No   Drug use: No     Allergies   Neurontin [gabapentin], Vancomycin, Doxycycline, Lithium, and Mirtazapine   Review of Systems Review of Systems  Skin:  Positive for rash.  As per HPI   Physical Exam Triage Vital Signs ED Triage Vitals [02/01/22 1515]  Enc Vitals Group     BP 124/63     Pulse Rate 83     Resp 18     Temp 98.7 F (37.1 C)     Temp Source Oral     SpO2 95 %     Weight      Height      Head Circumference      Peak Flow      Pain Score      Pain Loc      Pain Edu?      Excl. in GC?    No data found.  Updated Vital Signs BP 124/63 (BP Location: Right Arm)   Pulse 83   Temp 98.7 F (37.1 C) (Oral)   Resp 18   SpO2 95%  Visual Acuity Right Eye Distance:   Left Eye Distance:   Bilateral Distance:    Right Eye Near:   Left Eye Near:    Bilateral Near:     Physical Exam Vitals and nursing note reviewed. Exam conducted with a chaperone present.  Constitutional:      Appearance: Normal appearance.     Comments: Pt very uncomfortable, but is non-toxic  HENT:     Head: Normocephalic.  Cardiovascular:     Rate and Rhythm: Normal rate.  Pulmonary:     Effort: Pulmonary effort is normal. No respiratory distress.  Abdominal:     General: Abdomen is flat.  Genitourinary:    Penis:  Normal and circumcised.      Testes:        Right: Mass or swelling not present.        Left: Mass or swelling not present.  Musculoskeletal:     Right lower leg: No edema.  Skin:    General: Skin is warm.     Capillary Refill: Capillary refill takes less than 2 seconds.     Findings: Erythema (signficiant beefy red erythema extending from superior gluteal cleft, across bilateral medial clutes, and extending to perineal/ scrotal region) and rash (several satellite lesions noted to B intertrigonous region) present.  Neurological:     General: No focal deficit present.     Mental Status: He is alert and oriented to person, place, and time.  Psychiatric:        Mood and Affect: Mood normal.        Behavior: Behavior normal.           UC Treatments / Results  Labs (all labs ordered are listed, but only abnormal results are displayed) Labs Reviewed - No data to display  EKG   Radiology No results found.  Procedures Procedures (including critical care time)  Medications Ordered in UC Medications  cefTRIAXone (ROCEPHIN) injection 1 g (1 g Intramuscular Given 02/01/22 1559)    Initial Impression / Assessment and Plan / UC Course  I have reviewed the triage vital signs and the nursing notes.  Pertinent labs & imaging results that were available during my care of the patient were reviewed by me and considered in my medical decision making (see chart for details).     Erysipelas - suspected. Appears to be a raised version of standard cellulitis. 1g ceftriaxone given in office. Pt to continue keflex BID x 7 days. Line drawn around entire affected area of skin, pt to head to ER if any significant spread outside these lines, or if systemic symptoms occur Candidiasis - suspected in the intertriginous region. Pt has satellite lesions in the inner thigh region. Will do daily diflucan, avoid ETOH and tylenol  Final Clinical Impressions(s) / UC Diagnoses   Final diagnoses:   Erysipelas  Candidiasis of perineum     Discharge Instructions      Your skin appears to have a bacterial infection, most likely erysipelas in combination with candidasis. You were given an injection of Rocephin, a potent antibiotic, in our clinic today.    Please start taking oral Keflex 4 times daily for 1 week. Please also take 1 tablet of Diflucan daily for 7 days.  Do not drink alcohol while taking Diflucan.  Please call your surgeon and schedule a follow-up first thing tomorrow morning.  If you develop a fever, body aches, nausea or vomiting, or if the rash spreads outside of the line drawn today, please had  to the emergency room.     ED Prescriptions     Medication Sig Dispense Auth. Provider   cephALEXin (KEFLEX) 500 MG capsule Take 1 capsule (500 mg total) by mouth 4 (four) times daily for 7 days. 28 capsule Momodou Consiglio L, PA   fluconazole (DIFLUCAN) 200 MG tablet Take 1 tablet (200 mg total) by mouth daily for 7 days. 7 tablet Stevey Stapleton L, PA      PDMP not reviewed this encounter.   Maretta Bees, [place] 02/04/22 [phone]

## 2022-02-01 NOTE — ED Triage Notes (Signed)
Pt presents with a rash that is itchy and painful on his right buttock that wraps around to his thigh for a couple of days. He states he had a hemorrhoidectomy on 01/28/22.

## 2022-02-02 DIAGNOSIS — F411 Generalized anxiety disorder: Secondary | ICD-10-CM | POA: Diagnosis not present

## 2022-02-02 DIAGNOSIS — F4312 Post-traumatic stress disorder, chronic: Secondary | ICD-10-CM | POA: Diagnosis not present

## 2022-02-02 DIAGNOSIS — F603 Borderline personality disorder: Secondary | ICD-10-CM | POA: Diagnosis not present

## 2022-02-02 DIAGNOSIS — F410 Panic disorder [episodic paroxysmal anxiety] without agoraphobia: Secondary | ICD-10-CM | POA: Diagnosis not present

## 2022-02-03 ENCOUNTER — Other Ambulatory Visit: Payer: Self-pay

## 2022-02-03 ENCOUNTER — Ambulatory Visit (INDEPENDENT_AMBULATORY_CARE_PROVIDER_SITE_OTHER): Payer: BC Managed Care – PPO | Admitting: Family Medicine

## 2022-02-03 ENCOUNTER — Encounter: Payer: Self-pay | Admitting: Family Medicine

## 2022-02-03 ENCOUNTER — Inpatient Hospital Stay
Admission: EM | Admit: 2022-02-03 | Discharge: 2022-02-11 | DRG: 863 | Disposition: A | Discharge status: Home / Self Care | Payer: BC Managed Care – PPO | Attending: Internal Medicine | Admitting: Internal Medicine

## 2022-02-03 ENCOUNTER — Encounter: Payer: Self-pay | Admitting: Internal Medicine

## 2022-02-03 ENCOUNTER — Emergency Department: Payer: BC Managed Care – PPO

## 2022-02-03 DIAGNOSIS — Z888 Allergy status to other drugs, medicaments and biological substances status: Secondary | ICD-10-CM | POA: Diagnosis not present

## 2022-02-03 DIAGNOSIS — F319 Bipolar disorder, unspecified: Secondary | ICD-10-CM | POA: Diagnosis present

## 2022-02-03 DIAGNOSIS — W06XXXA Fall from bed, initial encounter: Secondary | ICD-10-CM | POA: Diagnosis not present

## 2022-02-03 DIAGNOSIS — Y838 Other surgical procedures as the cause of abnormal reaction of the patient, or of later complication, without mention of misadventure at the time of the procedure: Secondary | ICD-10-CM | POA: Diagnosis present

## 2022-02-03 DIAGNOSIS — Z8249 Family history of ischemic heart disease and other diseases of the circulatory system: Secondary | ICD-10-CM | POA: Diagnosis not present

## 2022-02-03 DIAGNOSIS — Y92230 Patient room in hospital as the place of occurrence of the external cause: Secondary | ICD-10-CM | POA: Diagnosis not present

## 2022-02-03 DIAGNOSIS — Z9689 Presence of other specified functional implants: Secondary | ICD-10-CM | POA: Diagnosis present

## 2022-02-03 DIAGNOSIS — F4310 Post-traumatic stress disorder, unspecified: Secondary | ICD-10-CM

## 2022-02-03 DIAGNOSIS — F3181 Bipolar II disorder: Secondary | ICD-10-CM | POA: Diagnosis present

## 2022-02-03 DIAGNOSIS — L0390 Cellulitis, unspecified: Secondary | ICD-10-CM

## 2022-02-03 DIAGNOSIS — K921 Melena: Secondary | ICD-10-CM | POA: Diagnosis present

## 2022-02-03 DIAGNOSIS — Z1152 Encounter for screening for COVID-19: Secondary | ICD-10-CM

## 2022-02-03 DIAGNOSIS — R1909 Other intra-abdominal and pelvic swelling, mass and lump: Secondary | ICD-10-CM | POA: Diagnosis not present

## 2022-02-03 DIAGNOSIS — N179 Acute kidney failure, unspecified: Secondary | ICD-10-CM | POA: Diagnosis not present

## 2022-02-03 DIAGNOSIS — Z823 Family history of stroke: Secondary | ICD-10-CM | POA: Diagnosis not present

## 2022-02-03 DIAGNOSIS — E785 Hyperlipidemia, unspecified: Secondary | ICD-10-CM | POA: Diagnosis present

## 2022-02-03 DIAGNOSIS — G894 Chronic pain syndrome: Secondary | ICD-10-CM | POA: Diagnosis present

## 2022-02-03 DIAGNOSIS — R110 Nausea: Secondary | ICD-10-CM | POA: Diagnosis not present

## 2022-02-03 DIAGNOSIS — M7989 Other specified soft tissue disorders: Secondary | ICD-10-CM | POA: Diagnosis not present

## 2022-02-03 DIAGNOSIS — Z6832 Body mass index (BMI) 32.0-32.9, adult: Secondary | ICD-10-CM

## 2022-02-03 DIAGNOSIS — I10 Essential (primary) hypertension: Secondary | ICD-10-CM | POA: Diagnosis present

## 2022-02-03 DIAGNOSIS — L03317 Cellulitis of buttock: Secondary | ICD-10-CM | POA: Diagnosis present

## 2022-02-03 DIAGNOSIS — N3289 Other specified disorders of bladder: Secondary | ICD-10-CM | POA: Diagnosis not present

## 2022-02-03 DIAGNOSIS — Z79890 Hormone replacement therapy (postmenopausal): Secondary | ICD-10-CM

## 2022-02-03 DIAGNOSIS — E039 Hypothyroidism, unspecified: Secondary | ICD-10-CM | POA: Diagnosis present

## 2022-02-03 DIAGNOSIS — T8140XA Infection following a procedure, unspecified, initial encounter: Secondary | ICD-10-CM | POA: Diagnosis present

## 2022-02-03 DIAGNOSIS — M533 Sacrococcygeal disorders, not elsewhere classified: Secondary | ICD-10-CM | POA: Diagnosis not present

## 2022-02-03 DIAGNOSIS — E669 Obesity, unspecified: Secondary | ICD-10-CM | POA: Diagnosis not present

## 2022-02-03 DIAGNOSIS — M549 Dorsalgia, unspecified: Secondary | ICD-10-CM | POA: Diagnosis present

## 2022-02-03 DIAGNOSIS — F419 Anxiety disorder, unspecified: Secondary | ICD-10-CM | POA: Diagnosis present

## 2022-02-03 DIAGNOSIS — Z800 Family history of malignant neoplasm of digestive organs: Secondary | ICD-10-CM

## 2022-02-03 DIAGNOSIS — Z818 Family history of other mental and behavioral disorders: Secondary | ICD-10-CM | POA: Diagnosis not present

## 2022-02-03 DIAGNOSIS — Z87891 Personal history of nicotine dependence: Secondary | ICD-10-CM | POA: Diagnosis not present

## 2022-02-03 DIAGNOSIS — K611 Rectal abscess: Secondary | ICD-10-CM | POA: Diagnosis not present

## 2022-02-03 DIAGNOSIS — R599 Enlarged lymph nodes, unspecified: Secondary | ICD-10-CM | POA: Diagnosis not present

## 2022-02-03 DIAGNOSIS — Z79891 Long term (current) use of opiate analgesic: Secondary | ICD-10-CM

## 2022-02-03 DIAGNOSIS — Z8349 Family history of other endocrine, nutritional and metabolic diseases: Secondary | ICD-10-CM

## 2022-02-03 DIAGNOSIS — Z801 Family history of malignant neoplasm of trachea, bronchus and lung: Secondary | ICD-10-CM | POA: Diagnosis not present

## 2022-02-03 DIAGNOSIS — Z79899 Other long term (current) drug therapy: Secondary | ICD-10-CM

## 2022-02-03 DIAGNOSIS — K645 Perianal venous thrombosis: Secondary | ICD-10-CM | POA: Diagnosis present

## 2022-02-03 LAB — CBC WITH DIFFERENTIAL/PLATELET
Abs Immature Granulocytes: 0.04 10*3/uL (ref 0.00–0.07)
Basophils Absolute: 0 10*3/uL (ref 0.0–0.1)
Basophils Relative: 0 %
Eosinophils Absolute: 0.8 10*3/uL — ABNORMAL HIGH (ref 0.0–0.5)
Eosinophils Relative: 9 %
HCT: 46.1 % (ref 39.0–52.0)
Hemoglobin: 14.6 g/dL (ref 13.0–17.0)
Immature Granulocytes: 1 %
Lymphocytes Relative: 22 %
Lymphs Abs: 1.9 10*3/uL (ref 0.7–4.0)
MCH: 28.6 pg (ref 26.0–34.0)
MCHC: 31.7 g/dL (ref 30.0–36.0)
MCV: 90.2 fL (ref 80.0–100.0)
Monocytes Absolute: 0.7 10*3/uL (ref 0.1–1.0)
Monocytes Relative: 8 %
Neutro Abs: 5.4 10*3/uL (ref 1.7–7.7)
Neutrophils Relative %: 60 %
Platelets: 210 10*3/uL (ref 150–400)
RBC: 5.11 MIL/uL (ref 4.22–5.81)
RDW: 13.2 % (ref 11.5–15.5)
WBC: 8.8 10*3/uL (ref 4.0–10.5)
nRBC: 0 % (ref 0.0–0.2)

## 2022-02-03 LAB — BASIC METABOLIC PANEL
Anion gap: 8 (ref 5–15)
BUN: 16 mg/dL (ref 6–20)
CO2: 25 mmol/L (ref 22–32)
Calcium: 8.7 mg/dL — ABNORMAL LOW (ref 8.9–10.3)
Chloride: 107 mmol/L (ref 98–111)
Creatinine, Ser: 1.48 mg/dL — ABNORMAL HIGH (ref 0.61–1.24)
GFR, Estimated: 57 mL/min — ABNORMAL LOW (ref >60)
Glucose, Bld: 136 mg/dL — ABNORMAL HIGH (ref 70–99)
Potassium: 4 mmol/L (ref 3.5–5.1)
Sodium: 140 mmol/L (ref 135–145)

## 2022-02-03 MED ORDER — TOPIRAMATE 25 MG PO TABS
50.0000 mg | ORAL_TABLET | Freq: Every day | ORAL | Status: DC
  Administered 2022-02-03 – 2022-02-10 (×7): 50 mg via ORAL
  Filled 2022-02-03 (×8): qty 2

## 2022-02-03 MED ORDER — ONDANSETRON HCL 4 MG PO TABS: 4.0000 mg | ORAL_TABLET | Freq: Four times a day (QID) | ORAL | Status: DC | PRN

## 2022-02-03 MED ORDER — IOHEXOL 300 MG/ML  SOLN
100.0000 mL | Freq: Once | INTRAMUSCULAR | Status: AC | PRN
  Administered 2022-02-03: 100 mL via INTRAVENOUS

## 2022-02-03 MED ORDER — LEVOTHYROXINE SODIUM 50 MCG PO TABS
75.0000 ug | ORAL_TABLET | Freq: Every day | ORAL | Status: DC
  Administered 2022-02-04 – 2022-02-11 (×8): 75 ug via ORAL
  Filled 2022-02-03 (×2): qty 2
  Filled 2022-02-03: qty 1.5
  Filled 2022-02-03 (×7): qty 2

## 2022-02-03 MED ORDER — LAMOTRIGINE 100 MG PO TABS
200.0000 mg | ORAL_TABLET | Freq: Two times a day (BID) | ORAL | Status: DC
  Administered 2022-02-03 – 2022-02-11 (×16): 200 mg via ORAL
  Filled 2022-02-03 (×16): qty 2

## 2022-02-03 MED ORDER — ACETAMINOPHEN 500 MG PO TABS
1000.0000 mg | ORAL_TABLET | Freq: Four times a day (QID) | ORAL | Status: DC | PRN
Start: 2022-02-03 — End: 2022-02-11

## 2022-02-03 MED ORDER — LUMATEPERONE TOSYLATE 42 MG PO CAPS: 42.0000 mg | ORAL_CAPSULE | Freq: Every day | ORAL | Status: DC

## 2022-02-03 MED ORDER — SODIUM CHLORIDE 0.9 % IV SOLN
1.0000 g | INTRAVENOUS | Status: DC
  Filled 2022-02-03: qty 10

## 2022-02-03 MED ORDER — OXYCODONE HCL 5 MG PO TABS: 5.0000 mg | ORAL_TABLET | Freq: Four times a day (QID) | ORAL | Status: DC | PRN

## 2022-02-03 MED ORDER — HYDROXYZINE HCL 25 MG PO TABS
50.0000 mg | ORAL_TABLET | Freq: Two times a day (BID) | ORAL | Status: DC | PRN
  Administered 2022-02-06 – 2022-02-09 (×2): 50 mg via ORAL
  Filled 2022-02-03 (×2): qty 2

## 2022-02-03 MED ORDER — TIZANIDINE HCL 4 MG PO TABS
4.0000 mg | ORAL_TABLET | Freq: Three times a day (TID) | ORAL | Status: DC | PRN
  Administered 2022-02-03 – 2022-02-11 (×15): 4 mg via ORAL
  Filled 2022-02-03 (×18): qty 1

## 2022-02-03 MED ORDER — ALPRAZOLAM 0.5 MG PO TABS
0.5000 mg | ORAL_TABLET | Freq: Every evening | ORAL | Status: DC | PRN
  Administered 2022-02-03: 0.5 mg via ORAL
  Filled 2022-02-03: qty 1

## 2022-02-03 MED ORDER — ONDANSETRON HCL 4 MG/2ML IJ SOLN
4.0000 mg | Freq: Four times a day (QID) | INTRAMUSCULAR | Status: DC | PRN
  Administered 2022-02-04 – 2022-02-06 (×2): 4 mg via INTRAVENOUS
  Filled 2022-02-03 (×2): qty 2

## 2022-02-03 MED ORDER — HYDROMORPHONE HCL 2 MG PO TABS
2.0000 mg | ORAL_TABLET | ORAL | Status: DC | PRN
  Administered 2022-02-03 – 2022-02-04 (×5): 2 mg via ORAL
  Filled 2022-02-03 (×5): qty 1

## 2022-02-03 MED ORDER — ESCITALOPRAM OXALATE 10 MG PO TABS
20.0000 mg | ORAL_TABLET | Freq: Every day | ORAL | Status: DC
  Administered 2022-02-04 – 2022-02-11 (×8): 20 mg via ORAL
  Filled 2022-02-03 (×8): qty 2

## 2022-02-03 MED ORDER — VORTIOXETINE HBR 5 MG PO TABS
10.0000 mg | ORAL_TABLET | Freq: Every day | ORAL | Status: DC
  Administered 2022-02-04 – 2022-02-11 (×8): 10 mg via ORAL
  Filled 2022-02-03 (×8): qty 2

## 2022-02-03 MED ORDER — ALPRAZOLAM 0.5 MG PO TABS
0.5000 mg | ORAL_TABLET | Freq: Once | ORAL | Status: AC
  Administered 2022-02-03: 0.5 mg via ORAL
  Filled 2022-02-03: qty 1

## 2022-02-03 MED ORDER — SUMATRIPTAN SUCCINATE 50 MG PO TABS
100.0000 mg | ORAL_TABLET | ORAL | Status: DC | PRN
Start: 2022-02-03 — End: 2022-02-11
  Administered 2022-02-07 – 2022-02-11 (×2): 100 mg via ORAL
  Filled 2022-02-03 (×2): qty 2

## 2022-02-03 MED ORDER — VITAMIN D 25 MCG (1000 UNIT) PO TABS
2000.0000 [IU] | ORAL_TABLET | Freq: Every day | ORAL | Status: DC
  Administered 2022-02-04 – 2022-02-11 (×8): 2000 [IU] via ORAL
  Filled 2022-02-03 (×8): qty 2

## 2022-02-03 MED ORDER — SODIUM CHLORIDE 0.9 % IV SOLN: INTRAVENOUS | Status: DC

## 2022-02-03 MED ORDER — OMEGA-3-ACID ETHYL ESTERS 1 G PO CAPS
2.0000 g | ORAL_CAPSULE | Freq: Every day | ORAL | Status: DC
  Administered 2022-02-04 – 2022-02-11 (×8): 2 g via ORAL
  Filled 2022-02-03 (×9): qty 2

## 2022-02-03 MED ORDER — FLUCONAZOLE 100 MG PO TABS
200.0000 mg | ORAL_TABLET | Freq: Every day | ORAL | Status: DC
  Administered 2022-02-04: 200 mg via ORAL
  Filled 2022-02-03: qty 2

## 2022-02-03 MED ORDER — OXYCODONE-ACETAMINOPHEN 5-325 MG PO TABS
1.0000 | ORAL_TABLET | Freq: Once | ORAL | Status: AC
  Administered 2022-02-03: 1 via ORAL
  Filled 2022-02-03: qty 1

## 2022-02-03 MED ORDER — SODIUM CHLORIDE 0.9 % IV SOLN
1.0000 g | Freq: Once | INTRAVENOUS | Status: AC
  Administered 2022-02-03: 1 g via INTRAVENOUS
  Filled 2022-02-03: qty 10

## 2022-02-03 MED ORDER — POLYETHYLENE GLYCOL 3350 17 GM/SCOOP PO POWD
17.0000 g | ORAL | Status: DC | PRN
Start: 2022-02-03 — End: 2022-02-11

## 2022-02-03 MED ORDER — ATORVASTATIN CALCIUM 10 MG PO TABS
10.0000 mg | ORAL_TABLET | Freq: Every day | ORAL | Status: DC
  Administered 2022-02-04 – 2022-02-11 (×8): 10 mg via ORAL
  Filled 2022-02-03 (×8): qty 1

## 2022-02-03 MED ORDER — BUSPIRONE HCL 10 MG PO TABS
10.0000 mg | ORAL_TABLET | Freq: Two times a day (BID) | ORAL | Status: DC
  Administered 2022-02-03 – 2022-02-11 (×16): 10 mg via ORAL
  Filled 2022-02-03 (×16): qty 1

## 2022-02-03 NOTE — Progress Notes (Signed)
Rash/UC f/u.  Rash has spread.  No fevers.  Already of keflex. He had BRBPR.  More pain in the meantime.  Recent surgical procedure noted.  He has had f/u re: PTSD.  He is going to f/u with psychiatry in the meantime. He was asking about options re: PTSD with psychology.  I told him I would check on this.  Meds, vitals, and allergies reviewed.   ROS: Per HPI unless specifically indicated in ROS section   No apparent respiratory distress but uncomfortable from perineal/rectal pain. Rrr Ctab Abd soft Diffuse erythema on the perineum extending from the scrotum to the rectal area and gluteal crease.

## 2022-02-04 DIAGNOSIS — L03317 Cellulitis of buttock: Secondary | ICD-10-CM

## 2022-02-04 DIAGNOSIS — L0390 Cellulitis, unspecified: Secondary | ICD-10-CM | POA: Insufficient documentation

## 2022-02-04 DIAGNOSIS — F4310 Post-traumatic stress disorder, unspecified: Secondary | ICD-10-CM | POA: Insufficient documentation

## 2022-02-04 MED ORDER — LUMATEPERONE TOSYLATE 42 MG PO CAPS
42.0000 mg | ORAL_CAPSULE | Freq: Every evening | ORAL | Status: DC
  Administered 2022-02-04 – 2022-02-09 (×6): 42 mg via ORAL
  Filled 2022-02-04 (×15): qty 1

## 2022-02-04 MED ORDER — HYDROMORPHONE HCL 2 MG PO TABS
4.0000 mg | ORAL_TABLET | ORAL | Status: DC | PRN
  Administered 2022-02-04 – 2022-02-07 (×16): 4 mg via ORAL
  Filled 2022-02-04 (×16): qty 2

## 2022-02-04 MED ORDER — NYSTATIN 100000 UNIT/GM EX CREA
TOPICAL_CREAM | Freq: Two times a day (BID) | CUTANEOUS | Status: AC
  Administered 2022-02-08: 1 via TOPICAL
  Filled 2022-02-04 (×2): qty 30

## 2022-02-04 MED ORDER — CEFAZOLIN SODIUM-DEXTROSE 1-4 GM/50ML-% IV SOLN
1.0000 g | Freq: Three times a day (TID) | INTRAVENOUS | Status: DC
  Administered 2022-02-04 – 2022-02-09 (×16): 1 g via INTRAVENOUS
  Filled 2022-02-04 (×16): qty 50

## 2022-02-04 MED ORDER — DIPHENHYDRAMINE HCL 25 MG PO CAPS
25.0000 mg | ORAL_CAPSULE | ORAL | Status: DC | PRN
Start: 2022-02-04 — End: 2022-02-05
  Administered 2022-02-04: 25 mg via ORAL
  Filled 2022-02-04: qty 1

## 2022-02-04 MED ORDER — ALPRAZOLAM 0.5 MG PO TABS
0.5000 mg | ORAL_TABLET | Freq: Two times a day (BID) | ORAL | Status: DC
  Administered 2022-02-04 – 2022-02-11 (×15): 0.5 mg via ORAL
  Filled 2022-02-04 (×15): qty 1

## 2022-02-04 MED ORDER — ENOXAPARIN SODIUM 40 MG/0.4ML IJ SOSY
40.0000 mg | PREFILLED_SYRINGE | INTRAMUSCULAR | Status: DC
  Administered 2022-02-04 – 2022-02-10 (×7): 40 mg via SUBCUTANEOUS
  Filled 2022-02-04 (×7): qty 0.4

## 2022-02-04 MED ORDER — HYDROMORPHONE HCL 2 MG PO TABS
2.0000 mg | ORAL_TABLET | Freq: Once | ORAL | Status: AC
  Administered 2022-02-04: 2 mg via ORAL
  Filled 2022-02-04: qty 1

## 2022-02-04 MED ORDER — KETOROLAC TROMETHAMINE 15 MG/ML IJ SOLN
15.0000 mg | Freq: Once | INTRAMUSCULAR | Status: AC
  Administered 2022-02-04: 15 mg via INTRAVENOUS
  Filled 2022-02-04: qty 1

## 2022-02-04 NOTE — Assessment & Plan Note (Signed)
Concern for spread in the setting of concurrent antibiotic use.  I called the emergency room about his pending arrival and discussed with staff.  Patient is going to go to the ER, his wife can drive him.  See inpatient notes.

## 2022-02-04 NOTE — Progress Notes (Signed)
[facility] SURGICAL PROGRESS NOTE  Hospital Day(s): 1.   Interval History:  Patient seen and examined No acute events or new complaints overnight.  Patient reports he has "about the same" rectal and gluteal discomfort compared to presentation No fever, chills, nausea No new labs this morning  He continues on rocephin  Vital signs in last 24 hours: [min-max] current  Temp:  [97.7 F (36.5 C)-98.5 F (36.9 C)] 98.3 F (36.8 C) (06/28 0753) Pulse Rate:  [54-85] 67 (06/28 0753) Resp:  [18-20] 18 (06/28 0753) BP: (112-137)/(76-86) 131/79 (06/28 0753) SpO2:  [93 %-98 %] 93 % (06/28 0753) Weight:  [88.9 kg] 88.9 kg (06/27 1228)     Height: 5\' 5"  (165.1 cm) Weight: 88.9 kg BMI (Calculated): 32.61   Intake/Output last 2 shifts:  06/27 0701 - 06/28 0700 In: 1343.4 [P.O.:45; I.V.:1198.4; IV Piggyback:100] Out: 1100 [Urine:1100]   Physical Exam:  Constitutional: alert, cooperative and no distress  Respiratory: breathing non-labored at rest  Cardiovascular: regular rate and sinus rhythm  GU: Perianal and gluteal crease erythema, agree, this appears in area of prep from surgery. No bleeding. No appreciable abscess   Labs:     Latest Ref Rng & Units 02/03/2022   12:54 PM 11/11/2021   10:41 AM 09/12/2021   10:16 AM  CBC  WBC 4.0 - 10.5 K/uL 8.8     Hemoglobin 13.0 - 17.0 g/dL 11/10/2021  47.8  29.5   Hematocrit 39.0 - 52.0 % 46.1  47.6  43.8   Platelets 150 - 400 K/uL 210         Latest Ref Rng & Units 02/03/2022   12:54 PM 07/23/2021    9:51 AM 03/03/2021   12:56 PM  CMP  Glucose 70 - 99 mg/dL 03/05/2021  72  308   BUN 6 - 20 mg/dL 16  11  8    Creatinine 0.61 - 1.24 mg/dL 657   8.46   Sodium 135 - 145 mmol/L 140  141  141   Potassium 3.5 - 5.1 mmol/L 4.0  4.3  4.7   Chloride 98 - 111 mmol/L 107  99  99   CO2 22 - 32 mmol/L 25  23  24    Calcium 8.9 - 10.3 mg/dL 8.7  9.5  9.4   Total Protein 6.0 - 8.5 g/dL  7.2    Total Bilirubin 0.0 - 1.2 mg/dL  0.3    Alkaline Phos  44 - 121 IU/L  104    AST 0 - 40 IU/L  23    ALT 0 - 44 IU/L  18       Imaging studies: No new pertinent imaging studies   Assessment/Plan: 51 y.o. male with potential gluteal cellulitis vs some degree of allergic reaction from surgical prep   - No indication for any further surgical intervention  - Agree with Abx  - Pain control prn  - Further management per primary service  All of the above findings and recommendations were discussed with the patient, and the medical team, and all of patient's questions were answered to his expressed satisfaction.  -- 9.52, PA-C  [facility] 02/04/2022, 8:26 AM M-F: 7am - 4pm

## 2022-02-04 NOTE — Progress Notes (Signed)
PROGRESS NOTE    Cody [name]  DGU:[id number] DOB: 11/08/1970 DOA: 02/03/2022 PCP: Joaquim Nam, MD    Brief Narrative:  51 y.o. male with medical history significant for chronic pain syndrome on morphine pump, degenerative disc disorder, hypertension, hypothyroidism, status post recent hemorrhoidectomy for thrombosed hemorrhoids with anal spasm who presents to the ER for evaluation of redness and pain involving his buttocks and scrotal area. Patient states that he had hemorrhoidectomy on 01/28/22 and within a few days postsurgery he developed a rash that has spread to involve both buttocks and scrotum.  He states that the rash was initially pruritic and he had used some hydrocortisone cream without any improvement.  He denies having any fever or chills but complains of pain with defecation which he describes as severe pain rated 10 x 10 in intensity at its worst as well as continued rectal bleeding post surgery for which he has to place dressings to prevent him from soiling his close and bedding's.   Assessment & Plan:   Principal Problem:   Cellulitis of gluteal region Active Problems:   Hypothyroidism   Bipolar affective (HCC)   SYNDROME, CHRONIC PAIN   Thrombosed hemorrhoids  * Cellulitis of gluteal region Patient presents to the ER for evaluation of redness and pruritus involving the gluteal area and scrotum. Imaging is consistent with cellulitis Plan: Continue Ancef 1 g every 8 hours Multimodal pain control Surgery following, no plans for surgical intervention Monitor cultures   Hypothyroidism Stable  Continue Synthroid   Thrombosed hemorrhoids Status post recent hemorrhoidectomy Patient continues to complain of severe anal pain with defecation and bleeding H&H is stable Multimodal pain control   SYNDROME, CHRONIC PAIN Patient has a morphine pump which will be continued during this hospitalization   Bipolar affective (HCC) Stable Continue Lexapro, Caplyta,  Trintellix, buspirone and Topamax     DVT prophylaxis: Lovenox Code Status: Full Family Communication: None today Disposition Plan: Status is: Inpatient Remains inpatient appropriate because: Severe gluteal cellulitis on IV antibiotics   Level of care: Med-Surg  Consultants:  General surgery  Procedures:  None  Antimicrobials: Cefazolin   Subjective: Seen and examined.  He reports persistent pain in the gluteal region associated with pruritus  Objective: Vitals:   02/03/22 1758 02/03/22 1944 02/04/22 0508 02/04/22 0753  BP: 115/84 112/80 137/85 131/79  Pulse: 71 65 67 67  Resp:  18 18 18   Temp: 98.2 F (36.8 C) 97.7 F (36.5 C) 97.8 F (36.6 C) 98.3 F (36.8 C)  TempSrc: Oral     SpO2: 97% 98% 98% 93%  Weight:      Height:        Intake/Output Summary (Last 24 hours) at 02/04/2022 1356 Last data filed at 02/04/2022 1350 Gross per 24 hour  Intake 3037.63 ml  Output 1750 ml  Net 1287.63 ml   Filed Weights   02/03/22 1228  Weight: 88.9 kg    Examination:  General exam: Mildly uncomfortable due to pain Respiratory system: Clear to auscultation. Respiratory effort normal. Cardiovascular system: S1-S2, RRR, no murmurs, no pedal edema Gastrointestinal system: Soft, NT/ND, normal bowel sounds Central nervous system: Alert and oriented. No focal neurological deficits. Extremities: Symmetric 5 x 5 power.   Skin: Gluteal region red, tender to touch Psychiatry: Judgement and insight appear normal. Mood & affect appropriate.     Data Reviewed: I have personally reviewed following labs and imaging studies  CBC: Recent Labs  Lab 02/03/22 1254  WBC 8.8  NEUTROABS 5.4  HGB 14.6  HCT 46.1  MCV 90.2  PLT 210   Basic Metabolic Panel: Recent Labs  Lab 02/03/22 1254  NA 140  K 4.0  CL 107  CO2 25  GLUCOSE 136*  BUN 16  CREATININE 1.48*  CALCIUM 8.7*   GFR: Estimated Creatinine Clearance: 61.2 mL/min (A) (by C-G formula based on SCr of 1.48 mg/dL  (H)). Liver Function Tests: No results for input(s): "AST", "ALT", "ALKPHOS", "BILITOT", "PROT", "ALBUMIN" in the last 168 hours. No results for input(s): "LIPASE", "AMYLASE" in the last 168 hours. No results for input(s): "AMMONIA" in the last 168 hours. Coagulation Profile: No results for input(s): "INR", "PROTIME" in the last 168 hours. Cardiac Enzymes: No results for input(s): "CKTOTAL", "CKMB", "CKMBINDEX", "TROPONINI" in the last 168 hours. BNP (last 3 results) No results for input(s): "PROBNP" in the last 8760 hours. HbA1C: No results for input(s): "HGBA1C" in the last 72 hours. CBG: No results for input(s): "GLUCAP" in the last 168 hours. Lipid Profile: No results for input(s): "CHOL", "HDL", "LDLCALC", "TRIG", "CHOLHDL", "LDLDIRECT" in the last 72 hours. Thyroid Function Tests: No results for input(s): "TSH", "T4TOTAL", "FREET4", "T3FREE", "THYROIDAB" in the last 72 hours. Anemia Panel: No results for input(s): "VITAMINB12", "FOLATE", "FERRITIN", "TIBC", "IRON", "RETICCTPCT" in the last 72 hours. Sepsis Labs: No results for input(s): "PROCALCITON", "LATICACIDVEN" in the last 168 hours.  No results found for this or any previous visit (from the past 240 hour(s)).       Radiology Studies: CT PELVIS W CONTRAST  Result Date: 02/03/2022 CLINICAL DATA:  Anal/rectal abscess EXAM: CT PELVIS WITH CONTRAST TECHNIQUE: Multidetector CT imaging of the pelvis was performed using the standard protocol following the bolus administration of intravenous contrast. RADIATION DOSE REDUCTION: This exam was performed according to the departmental dose-optimization program which includes automated exposure control, adjustment of the mA and/or kV according to patient size and/or use of iterative reconstruction technique. CONTRAST:  OMNIPAQUE IOHEXOL 300 MG/ML  SOLN COMPARISON:  None Available. FINDINGS: Urinary Tract:  The bladder is moderate distended. Bowel:  Unremarkable visualized pelvic  bowel loops. Vascular/Lymphatic: No pathologically enlarged lymph nodes. No significant vascular abnormality seen. Reproductive:  Unremarkable Other: There is skin thickening and subcutaneous soft tissue swelling along the left gluteal cleft from near the anus to the medial upper left thigh. There is no well-defined fluid collection. No ischiorectal fossa involvement. No soft tissue gas Musculoskeletal: No acute osseous abnormality. No suspicious osseous lesion. IMPRESSION: Skin thickening and subcutaneous soft tissue swelling along the left gluteal cleft from near the anus to the medial upper left thigh, as can be seen in cellulitis. No soft tissue gas. No well-defined/drainable fluid collection. Electronically Signed   By: Caprice Renshaw M.D.   On: 02/03/2022 14:17        Scheduled Meds:  ALPRAZolam  0.5 mg Oral BID   atorvastatin  10 mg Oral Daily   busPIRone  10 mg Oral BID   cholecalciferol  2,000 Units Oral Daily   enoxaparin (LOVENOX) injection  40 mg Subcutaneous Q24H   escitalopram  20 mg Oral Daily   lamoTRIgine  200 mg Oral BID   levothyroxine  75 mcg Oral QAC breakfast   lumateperone tosylate  42 mg Oral Daily   nystatin cream   Topical BID   omega-3 acid ethyl esters  2 g Oral Daily   topiramate  50 mg Oral QHS   vortioxetine HBr  10 mg Oral Daily   Continuous Infusions:  sodium chloride 100 mL/hr  at 02/04/22 1229    ceFAZolin (ANCEF) IV 1 g (02/04/22 1234)     LOS: 1 day     Tresa Moore, MD [facility]   If 7PM-7AM, please contact night-coverage  02/04/2022, 1:56 PM

## 2022-02-04 NOTE — Assessment & Plan Note (Signed)
See referral

## 2022-02-05 DIAGNOSIS — L03317 Cellulitis of buttock: Secondary | ICD-10-CM

## 2022-02-05 LAB — BASIC METABOLIC PANEL
Anion gap: 5 (ref 5–15)
BUN: 11 mg/dL (ref 6–20)
CO2: 25 mmol/L (ref 22–32)
Calcium: 8 mg/dL — ABNORMAL LOW (ref 8.9–10.3)
Chloride: 113 mmol/L — ABNORMAL HIGH (ref 98–111)
Creatinine, Ser: 1.24 mg/dL (ref 0.61–1.24)
GFR, Estimated: >60 mL/min (ref >60)
Glucose, Bld: 126 mg/dL — ABNORMAL HIGH (ref 70–99)
Potassium: 4.4 mmol/L (ref 3.5–5.1)
Sodium: 143 mmol/L (ref 135–145)

## 2022-02-05 LAB — CBC
HCT: 46.9 % (ref 39.0–52.0)
Hemoglobin: 14.9 g/dL (ref 13.0–17.0)
MCH: 28.3 pg (ref 26.0–34.0)
MCHC: 31.8 g/dL (ref 30.0–36.0)
MCV: 89 fL (ref 80.0–100.0)
Platelets: 202 10*3/uL (ref 150–400)
RBC: 5.27 MIL/uL (ref 4.22–5.81)
RDW: 13 % (ref 11.5–15.5)
WBC: 8.3 10*3/uL (ref 4.0–10.5)
nRBC: 0 % (ref 0.0–0.2)

## 2022-02-05 MED ORDER — FAMOTIDINE 20 MG PO TABS
20.0000 mg | ORAL_TABLET | Freq: Two times a day (BID) | ORAL | Status: DC
  Administered 2022-02-05 – 2022-02-11 (×13): 20 mg via ORAL
  Filled 2022-02-05 (×13): qty 1

## 2022-02-05 MED ORDER — DIPHENHYDRAMINE HCL 25 MG PO CAPS
50.0000 mg | ORAL_CAPSULE | Freq: Three times a day (TID) | ORAL | Status: DC
  Administered 2022-02-05 – 2022-02-11 (×19): 50 mg via ORAL
  Filled 2022-02-05 (×19): qty 2

## 2022-02-05 MED ORDER — DIPHENHYDRAMINE HCL 25 MG PO CAPS: 25.0000 mg | ORAL_CAPSULE | ORAL | Status: DC | PRN

## 2022-02-05 NOTE — Progress Notes (Addendum)
[facility] SURGICAL PROGRESS NOTE  Hospital Day(s): 2.   Interval History:  Patient seen and examined No acute events or new complaints overnight.  Patient reports he continues to have relatively unchanged gluteal pain Interestingly, he reports that he is very itchy this morning, which is worse compared to yesterday.  No fever, chills, nausea He continues to remain without leukocytosis; WBC 8.3K Renal function normalized; sCr - 1.24 He continues on rocephin  Vital signs in last 24 hours: [min-max] current  Temp:  [97.5 F (36.4 C)-98.3 F (36.8 C)] 97.5 F (36.4 C) (06/29 0543) Pulse Rate:  [58-72] 58 (06/29 0543) Resp:  [17-20] 20 (06/29 0543) BP: (110-155)/(69-88) 110/69 (06/29 0543) SpO2:  [93 %-97 %] 97 % (06/29 0543)     Height: 5\' 5"  (165.1 cm) Weight: 88.9 kg BMI (Calculated): 32.61   Intake/Output last 2 shifts:  06/28 0701 - 06/29 0700 In: 3388.3 [P.O.:1200; I.V.:2038.3; IV Piggyback:150] Out: 1250 [Urine:1250]   Physical Exam:  Constitutional: alert, cooperative and no distress  Respiratory: breathing non-labored at rest  Cardiovascular: regular rate and sinus rhythm  GU: Perianal and gluteal crease erythema, agree, this appears in area of prep from surgery. No bleeding. No appreciable abscess. Interestingly, he has developed a urticarial rash to mainly his left buttock, this is pruritic   Labs:     Latest Ref Rng & Units 02/05/2022    4:32 AM 02/03/2022   12:54 PM 11/11/2021   10:41 AM  CBC  WBC 4.0 - 10.5 K/uL 8.3  8.8    Hemoglobin 13.0 - 17.0 g/dL 01/11/2022  25.8  52.7   Hematocrit 39.0 - 52.0 % 46.9  46.1  47.6   Platelets 150 - 400 K/uL 202  210        Latest Ref Rng & Units 02/05/2022    4:32 AM 02/03/2022   12:54 PM 07/23/2021    9:51 AM  CMP  Glucose 70 - 99 mg/dL 07/25/2021  423  72   BUN 6 - 20 mg/dL 11  16  11    Creatinine 0.61 - 1.24 mg/dL 536   1.44   Sodium 135 - 145 mmol/L 143  140  141   Potassium 3.5 - 5.1 mmol/L 4.4  4.0   4.3   Chloride 98 - 111 mmol/L 113  107  99   CO2 22 - 32 mmol/L 25  25  23    Calcium 8.9 - 10.3 mg/dL 8.0  8.7  9.5   Total Protein 6.0 - 8.5 g/dL   7.2   Total Bilirubin 0.0 - 1.2 mg/dL   0.3   Alkaline Phos 44 - 121 IU/L   104   AST 0 - 40 IU/L   23   ALT 0 - 44 IU/L   18      Imaging studies: No new pertinent imaging studies   Assessment/Plan: 51 y.o. male with potential gluteal cellulitis vs some degree of allergic reaction from surgical prep   - No indication for any further surgical intervention  - Agree with Abx  - Continue Benadryl vs atarax; added famotidine as well  - Pain control prn  - Further management per primary service   - Discharge Planning; Suspect he will benefit from an additional day given pain/itching, no evidence of worsening infection  All of the above findings and recommendations were discussed with the patient, and the medical team, and all of patient's questions were answered to his expressed satisfaction.  -- 4.00, PA-C [facility] 02/05/2022, 7:16 AM M-F: 7am - 4pm

## 2022-02-05 NOTE — Progress Notes (Signed)
PROGRESS NOTE    Cody [name]  DGL:[id number] DOB: July 24, 1971 DOA: 02/03/2022 PCP: Joaquim Nam, MD    Brief Narrative:  51 y.o. male with medical history significant for chronic pain syndrome on morphine pump, degenerative disc disorder, hypertension, hypothyroidism, status post recent hemorrhoidectomy for thrombosed hemorrhoids with anal spasm who presents to the ER for evaluation of redness and pain involving his buttocks and scrotal area. Patient states that he had hemorrhoidectomy on 01/28/22 and within a few days postsurgery he developed a rash that has spread to involve both buttocks and scrotum.  He states that the rash was initially pruritic and he had used some hydrocortisone cream without any improvement.  He denies having any fever or chills but complains of pain with defecation which he describes as severe pain rated 10 x 10 in intensity at its worst as well as continued rectal bleeding post surgery for which he has to place dressings to prevent him from soiling his close and bedding's.  6/29: More allergic symptoms and relatively unchanged gluteal pain.  White count normal.  Renal function normal.  Remains on Ancef.   Assessment & Plan:   Principal Problem:   Cellulitis of gluteal region Active Problems:   Hypothyroidism   Bipolar affective (HCC)   SYNDROME, CHRONIC PAIN   Thrombosed hemorrhoids  * Cellulitis of gluteal region Patient presents to the ER for evaluation of redness and pruritus involving the gluteal area and scrotum. Imaging is consistent with cellulitis Strangely there seems to be a allergic component Patient has had history of surgeries without history of allergic reactions but he is markedly pruritic Plan: Continue Ancef 1 g every 8 hours Multimodal pain control Surgery following, no plans for surgical intervention Monitor cultures, no growth to date Possible allergic component, scheduled Benadryl 50 mg 3 times daily Vitals per unit  protocol Daily labs   Hypothyroidism Stable  Continue Synthroid   Thrombosed hemorrhoids Status post recent hemorrhoidectomy Patient continues to complain of severe anal pain with defecation and bleeding H&H is stable Multimodal pain control   SYNDROME, CHRONIC PAIN Patient has a morphine pump which will be continued during this hospitalization As needed for breakthrough pain Multimodal pain regimen   Bipolar affective (HCC) Stable Continue Lexapro, Caplyta, Trintellix, buspirone and Topamax     DVT prophylaxis: Lovenox Code Status: Full Family Communication: None today Disposition Plan: Status is: Inpatient Remains inpatient appropriate because: Gluteal cellulitis on intravenous antibiotics.  Hopeful to discharge 6/30   Level of care: Med-Surg  Consultants:  General surgery  Procedures:  None  Antimicrobials: Cefazolin   Subjective: Seen and examined.  Persistent pain in gluteal region associated with pruritus  Objective: Vitals:   02/04/22 1622 02/04/22 1954 02/05/22 0543 02/05/22 0806  BP: (!) 155/87 (!) 146/88 110/69 120/76  Pulse: 72 66 (!) 58 (!) 59  Resp: 17 19 20 16   Temp: 97.8 F (36.6 C) 98 F (36.7 C) (!) 97.5 F (36.4 C) 97.7 F (36.5 C)  TempSrc: Oral Oral    SpO2: 93% 96% 97% 96%  Weight:      Height:        Intake/Output Summary (Last 24 hours) at 02/05/2022 1058 Last data filed at 02/05/2022 0620 Gross per 24 hour  Intake 3028.28 ml  Output 600 ml  Net 2428.28 ml   Filed Weights   02/03/22 1228  Weight: 88.9 kg    Examination:  General exam: Mild distress due to pain Respiratory system: Clear to auscultation. Respiratory effort normal. Cardiovascular  system: S1-S2, RRR, no murmurs, no pedal edema Gastrointestinal system: Soft, NT/ND, normal bowel sounds Central nervous system: Alert and oriented. No focal neurological deficits. Extremities: Symmetric 5 x 5 power.   Skin: Gluteal region erythematous, tender to  touch Psychiatry: Judgement and insight appear normal. Mood & affect appropriate.     Data Reviewed: I have personally reviewed following labs and imaging studies  CBC: Recent Labs  Lab 02/03/22 1254 02/05/22 0432  WBC 8.8 8.3  NEUTROABS 5.4  --   HGB 14.6 14.9  HCT 46.1 46.9  MCV 90.2 89.0  PLT 210 202   Basic Metabolic Panel: Recent Labs  Lab 02/03/22 1254 02/05/22 0432  NA 140 143  K 4.0 4.4  CL 107 113*  CO2 25 25  GLUCOSE 136* 126*  BUN 16 11  CREATININE 1.48* 1.24  CALCIUM 8.7* 8.0*   GFR: Estimated Creatinine Clearance: 73.1 mL/min (by C-G formula based on SCr of 1.24 mg/dL). Liver Function Tests: No results for input(s): "AST", "ALT", "ALKPHOS", "BILITOT", "PROT", "ALBUMIN" in the last 168 hours. No results for input(s): "LIPASE", "AMYLASE" in the last 168 hours. No results for input(s): "AMMONIA" in the last 168 hours. Coagulation Profile: No results for input(s): "INR", "PROTIME" in the last 168 hours. Cardiac Enzymes: No results for input(s): "CKTOTAL", "CKMB", "CKMBINDEX", "TROPONINI" in the last 168 hours. BNP (last 3 results) No results for input(s): "PROBNP" in the last 8760 hours. HbA1C: No results for input(s): "HGBA1C" in the last 72 hours. CBG: No results for input(s): "GLUCAP" in the last 168 hours. Lipid Profile: No results for input(s): "CHOL", "HDL", "LDLCALC", "TRIG", "CHOLHDL", "LDLDIRECT" in the last 72 hours. Thyroid Function Tests: No results for input(s): "TSH", "T4TOTAL", "FREET4", "T3FREE", "THYROIDAB" in the last 72 hours. Anemia Panel: No results for input(s): "VITAMINB12", "FOLATE", "FERRITIN", "TIBC", "IRON", "RETICCTPCT" in the last 72 hours. Sepsis Labs: No results for input(s): "PROCALCITON", "LATICACIDVEN" in the last 168 hours.  No results found for this or any previous visit (from the past 240 hour(s)).       Radiology Studies: CT PELVIS W CONTRAST  Result Date: 02/03/2022 CLINICAL DATA:  Anal/rectal abscess  EXAM: CT PELVIS WITH CONTRAST TECHNIQUE: Multidetector CT imaging of the pelvis was performed using the standard protocol following the bolus administration of intravenous contrast. RADIATION DOSE REDUCTION: This exam was performed according to the departmental dose-optimization program which includes automated exposure control, adjustment of the mA and/or kV according to patient size and/or use of iterative reconstruction technique. CONTRAST:  OMNIPAQUE IOHEXOL 300 MG/ML  SOLN COMPARISON:  None Available. FINDINGS: Urinary Tract:  The bladder is moderate distended. Bowel:  Unremarkable visualized pelvic bowel loops. Vascular/Lymphatic: No pathologically enlarged lymph nodes. No significant vascular abnormality seen. Reproductive:  Unremarkable Other: There is skin thickening and subcutaneous soft tissue swelling along the left gluteal cleft from near the anus to the medial upper left thigh. There is no well-defined fluid collection. No ischiorectal fossa involvement. No soft tissue gas Musculoskeletal: No acute osseous abnormality. No suspicious osseous lesion. IMPRESSION: Skin thickening and subcutaneous soft tissue swelling along the left gluteal cleft from near the anus to the medial upper left thigh, as can be seen in cellulitis. No soft tissue gas. No well-defined/drainable fluid collection. Electronically Signed   By: Caprice Renshaw M.D.   On: 02/03/2022 14:17        Scheduled Meds:  ALPRAZolam  0.5 mg Oral BID   atorvastatin  10 mg Oral Daily   busPIRone  10 mg Oral  BID   cholecalciferol  2,000 Units Oral Daily   diphenhydrAMINE  50 mg Oral TID   enoxaparin (LOVENOX) injection  40 mg Subcutaneous Q24H   escitalopram  20 mg Oral Daily   famotidine  20 mg Oral BID   lamoTRIgine  200 mg Oral BID   levothyroxine  75 mcg Oral QAC breakfast   lumateperone tosylate  42 mg Oral QPM   nystatin cream   Topical BID   omega-3 acid ethyl esters  2 g Oral Daily   topiramate  50 mg Oral QHS    vortioxetine HBr  10 mg Oral Daily   Continuous Infusions:  sodium chloride 100 mL/hr at 02/05/22 1010    ceFAZolin (ANCEF) IV 1 g (02/05/22 0501)     LOS: 2 days     Sidney Ace, MD [facility]   If 7PM-7AM, please contact night-coverage  02/05/2022, 10:58 AM

## 2022-02-06 ENCOUNTER — Encounter: Payer: Self-pay | Admitting: Internal Medicine

## 2022-02-06 ENCOUNTER — Inpatient Hospital Stay: Payer: BC Managed Care – PPO

## 2022-02-06 DIAGNOSIS — L03317 Cellulitis of buttock: Secondary | ICD-10-CM

## 2022-02-06 LAB — CBC
HCT: 46.7 % (ref 39.0–52.0)
Hemoglobin: 14.9 g/dL (ref 13.0–17.0)
MCH: 28.7 pg (ref 26.0–34.0)
MCHC: 31.9 g/dL (ref 30.0–36.0)
MCV: 89.8 fL (ref 80.0–100.0)
Platelets: 196 10*3/uL (ref 150–400)
RBC: 5.2 MIL/uL (ref 4.22–5.81)
RDW: 13.2 % (ref 11.5–15.5)
WBC: 7.2 10*3/uL (ref 4.0–10.5)
nRBC: 0 % (ref 0.0–0.2)

## 2022-02-06 LAB — BASIC METABOLIC PANEL
Anion gap: 5 (ref 5–15)
BUN: 11 mg/dL (ref 6–20)
CO2: 25 mmol/L (ref 22–32)
Calcium: 8.5 mg/dL — ABNORMAL LOW (ref 8.9–10.3)
Chloride: 112 mmol/L — ABNORMAL HIGH (ref 98–111)
Creatinine, Ser: 1.31 mg/dL — ABNORMAL HIGH (ref 0.61–1.24)
GFR, Estimated: >60 mL/min (ref >60)
Glucose, Bld: 108 mg/dL — ABNORMAL HIGH (ref 70–99)
Potassium: 4.2 mmol/L (ref 3.5–5.1)
Sodium: 142 mmol/L (ref 135–145)

## 2022-02-06 MED ORDER — NICOTINE 21 MG/24HR TD PT24
21.0000 mg | MEDICATED_PATCH | Freq: Every day | TRANSDERMAL | Status: DC
  Administered 2022-02-06 – 2022-02-10 (×5): 21 mg via TRANSDERMAL
  Filled 2022-02-06 (×5): qty 1

## 2022-02-06 MED ORDER — ORAL CARE MOUTH RINSE: 15.0000 mL | OROMUCOSAL | Status: DC | PRN

## 2022-02-06 MED ORDER — IOHEXOL 300 MG/ML  SOLN
100.0000 mL | Freq: Once | INTRAMUSCULAR | Status: AC | PRN
  Administered 2022-02-06: 100 mL via INTRAVENOUS

## 2022-02-06 NOTE — Progress Notes (Signed)
[facility] SURGICAL PROGRESS NOTE  Hospital Day(s): 3.   Interval History:  Patient seen and examined No acute events or new complaints overnight.  Patient reports the itching is improved but he is more sore now on the right inner thigh with some erythema in this area as well no No fever, chills, nausea No new labs this morning He is on Ancef  Vital signs in last 24 hours: [min-max] current  Temp:  [97.5 F (36.4 C)-98.1 F (36.7 C)] 97.9 F (36.6 C) (06/30 0255) Pulse Rate:  [54-96] 96 (06/30 0255) Resp:  [15-16] 16 (06/30 0255) BP: (100-147)/(53-92) 122/53 (06/30 0255) SpO2:  [92 %-96 %] 92 % (06/30 0255)     Height: 5\' 5"  (165.1 cm) Weight: 88.9 kg BMI (Calculated): 32.61   Intake/Output last 2 shifts:  06/29 0701 - 06/30 0700 In: 848.1 [I.V.:798.1; IV Piggyback:50] Out: -    Physical Exam:  Constitutional: alert, cooperative and no distress  Respiratory: breathing non-labored at rest  Cardiovascular: regular rate and sinus rhythm  GU: Left sided gluteal and perianal erythema and previously noted urticarial rash are improved. He does have new area of erythema to the right inner thigh, no evidence of abscess or purulence   Labs:     Latest Ref Rng & Units 02/05/2022    4:32 AM 02/03/2022   12:54 PM 11/11/2021   10:41 AM  CBC  WBC 4.0 - 10.5 K/uL 8.3  8.8    Hemoglobin 13.0 - 17.0 g/dL 01/11/2022  95.0  93.2   Hematocrit 39.0 - 52.0 % 46.9  46.1  47.6   Platelets 150 - 400 K/uL 202  210        Latest Ref Rng & Units 02/05/2022    4:32 AM 02/03/2022   12:54 PM 07/23/2021    9:51 AM  CMP  Glucose 70 - 99 mg/dL 07/25/2021  245  72   BUN 6 - 20 mg/dL 11  16  11    Creatinine 0.61 - 1.24 mg/dL 809   9.83   Sodium 135 - 145 mmol/L 143  140  141   Potassium 3.5 - 5.1 mmol/L 4.4  4.0  4.3   Chloride 98 - 111 mmol/L 113  107  99   CO2 22 - 32 mmol/L 25  25  23    Calcium 8.9 - 10.3 mg/dL 8.0  8.7  9.5   Total Protein 6.0 - 8.5 g/dL   7.2   Total Bilirubin 0.0 -  1.2 mg/dL   0.3   Alkaline Phos 44 - 121 IU/L   104   AST 0 - 40 IU/L   23   ALT 0 - 44 IU/L   18      Imaging studies: No new pertinent imaging studies   Assessment/Plan: 51 y.o. male with potential gluteal cellulitis vs some degree of allergic reaction from surgical prep   - No indication for any further surgical intervention  - New area of erythema in the right inner thigh; no evidence of abscess or worsening infection. May consider repeating pelvic CT if worsens, develops fevers, etc.   - Agree with Abx  - Continue Benadryl vs atarax; added famotidine as well yesterday (06/29)  - Pain control prn  - Further management per primary service; Nothing to add at this point from surgical perspective; We will be available while in house. He has follow up with me next week (07/06).   All of the above findings and recommendations were discussed with  the patient, and the medical team, and all of patient's questions were answered to his expressed satisfaction.  -- [name], PA-C [facility] 02/06/2022, 7:15 AM M-F: 7am - 4pm

## 2022-02-06 NOTE — Progress Notes (Signed)
PROGRESS NOTE    Cody [name]  OAC:[id number] DOB: 06-12-1971 DOA: 02/03/2022 PCP: Joaquim Nam, MD    Brief Narrative:  52 y.o. male with medical history significant for chronic pain syndrome on morphine pump, degenerative disc disorder, hypertension, hypothyroidism, status post recent hemorrhoidectomy for thrombosed hemorrhoids with anal spasm who presents to the ER for evaluation of redness and pain involving his buttocks and scrotal area. Patient states that he had hemorrhoidectomy on 01/28/22 and within a few days postsurgery he developed a rash that has spread to involve both buttocks and scrotum.  He states that the rash was initially pruritic and he had used some hydrocortisone cream without any improvement.  He denies having any fever or chills but complains of pain with defecation which he describes as severe pain rated 10 x 10 in intensity at its worst as well as continued rectal bleeding post surgery for which he has to place dressings to prevent him from soiling his close and bedding's.  6/29: More allergic symptoms and relatively unchanged gluteal pain.  White count normal.  Renal function normal.  Remains on Ancef.  6/30: No appreciable status changes.  Patient states that area of redness has expanded and is now affecting his perennial region as well as his penis.  Repeat CT pelvis performed.  Interval improvement in cellulitic change without evidence of abscess.   Assessment & Plan:   Principal Problem:   Cellulitis of gluteal region Active Problems:   Hypothyroidism   Bipolar affective (HCC)   SYNDROME, CHRONIC PAIN   Thrombosed hemorrhoids  * Cellulitis of gluteal region Patient presents to the ER for evaluation of redness and pruritus involving the gluteal area and scrotum. Imaging is consistent with cellulitis Strangely there seems to be a allergic component Patient has had history of surgeries without history of allergic reactions but he is markedly  pruritic Repeat CT pelvis on 6/30 shows interval improvement Plan: Continue Ancef 1 g every 8 hours Multimodal pain control Surgery following, no plans for surgical intervention Monitor cultures, no growth to date Possible allergic component, scheduled Benadryl 50 mg 3 times daily As needed Atarax and famotidine on board Vitals per unit protocol Daily labs   Hypothyroidism Stable  Continue Synthroid   Thrombosed hemorrhoids Status post recent hemorrhoidectomy Patient continues to complain of severe anal pain with defecation and bleeding H&H is stable Multimodal pain control   SYNDROME, CHRONIC PAIN Patient has a morphine pump which will be continued during this hospitalization As needed for breakthrough pain Multimodal pain regimen   Bipolar affective (HCC) Stable Continue Lexapro, Caplyta, Trintellix, buspirone and Topamax     DVT prophylaxis: Lovenox Code Status: Full Family Communication: None today Disposition Plan: Status is: Inpatient Remains inpatient appropriate because: Gluteal cellulitis on intravenous antibiotics.  Hopeful to discharge 7/1   Level of care: Med-Surg  Consultants:  General surgery  Procedures:  None  Antimicrobials: Cefazolin   Subjective: Seen and examined.  Persistent pain in gluteal region associated with pruritus.  States that area of pain and redness now involves his penis.  Objective: Vitals:   02/05/22 1946 02/06/22 0249 02/06/22 0255 02/06/22 0818  BP: (!) 141/72 (!) 100/56 (!) 122/53 126/83  Pulse: 65 (!) 54 96 (!) 59  Resp: 16 16 16 18   Temp: 98.1 F (36.7 C) (!) 97.5 F (36.4 C) 97.9 F (36.6 C) 98 F (36.7 C)  TempSrc:      SpO2: 96% 94% 92% 96%  Weight:      Height:  Intake/Output Summary (Last 24 hours) at 02/06/2022 1328 Last data filed at 02/06/2022 1300 Gross per 24 hour  Intake 2504.82 ml  Output 1350 ml  Net 1154.82 ml   Filed Weights   02/03/22 1228  Weight: 88.9 kg     Examination:  General exam: Anxious appearing Respiratory system: Clear to auscultation. Respiratory effort normal. Cardiovascular system: S1-S2, RRR, no murmurs, no pedal edema Gastrointestinal system: Soft, NT/ND, normal bowel sounds Central nervous system: Alert and oriented. No focal neurological deficits. Extremities: Symmetric 5 x 5 power.   Skin: Gluteal region erythematous, tender to touch, extending) M Psychiatry: Judgement and insight appear normal. Mood & affect appropriate.     Data Reviewed: I have personally reviewed following labs and imaging studies  CBC: Recent Labs  Lab 02/03/22 1254 02/05/22 0432 02/06/22 0947  WBC 8.8 8.3 7.2  NEUTROABS 5.4  --   --   HGB 14.6 14.9 14.9  HCT 46.1 46.9 46.7  MCV 90.2 89.0 89.8  PLT 210 202 196   Basic Metabolic Panel: Recent Labs  Lab 02/03/22 1254 02/05/22 0432 02/06/22 0947  NA 140 143 142  K 4.0 4.4 4.2  CL 107 113* 112*  CO2 25 25 25   GLUCOSE 136* 126* 108*  BUN 16 11 11   CREATININE 1.48* 1.24 1.31*  CALCIUM 8.7* 8.0* 8.5*   GFR: Estimated Creatinine Clearance: 69.2 mL/min (A) (by C-G formula based on SCr of 1.31 mg/dL (H)). Liver Function Tests: No results for input(s): "AST", "ALT", "ALKPHOS", "BILITOT", "PROT", "ALBUMIN" in the last 168 hours. No results for input(s): "LIPASE", "AMYLASE" in the last 168 hours. No results for input(s): "AMMONIA" in the last 168 hours. Coagulation Profile: No results for input(s): "INR", "PROTIME" in the last 168 hours. Cardiac Enzymes: No results for input(s): "CKTOTAL", "CKMB", "CKMBINDEX", "TROPONINI" in the last 168 hours. BNP (last 3 results) No results for input(s): "PROBNP" in the last 8760 hours. HbA1C: No results for input(s): "HGBA1C" in the last 72 hours. CBG: No results for input(s): "GLUCAP" in the last 168 hours. Lipid Profile: No results for input(s): "CHOL", "HDL", "LDLCALC", "TRIG", "CHOLHDL", "LDLDIRECT" in the last 72 hours. Thyroid Function  Tests: No results for input(s): "TSH", "T4TOTAL", "FREET4", "T3FREE", "THYROIDAB" in the last 72 hours. Anemia Panel: No results for input(s): "VITAMINB12", "FOLATE", "FERRITIN", "TIBC", "IRON", "RETICCTPCT" in the last 72 hours. Sepsis Labs: No results for input(s): "PROCALCITON", "LATICACIDVEN" in the last 168 hours.  No results found for this or any previous visit (from the past 240 hour(s)).       Radiology Studies: CT PELVIS W CONTRAST  Result Date: 02/06/2022 CLINICAL DATA:  Soft tissue infection suspected, pelvis, xray done EXAM: CT PELVIS WITH CONTRAST TECHNIQUE: Multidetector CT imaging of the pelvis was performed using the standard protocol following the bolus administration of intravenous contrast. RADIATION DOSE REDUCTION: This exam was performed according to the departmental dose-optimization program which includes automated exposure control, adjustment of the mA and/or kV according to patient size and/or use of iterative reconstruction technique. CONTRAST:  OMNIPAQUE IOHEXOL 300 MG/ML  SOLN COMPARISON:  CT 02/03/2022 FINDINGS: Urinary Tract:  No abnormality visualized. Bowel:  Unremarkable visualized pelvic bowel loops. Vascular/Lymphatic: There are few prominent inguinal lymph nodes, likely reactive. No significant vascular findings. Reproductive:  Unremarkable Other: Interval improvement in skin thickening and soft tissue swelling along the left gluteal cleft. There is no focal fluid collection. No soft tissue gas. Musculoskeletal: No acute osseous abnormality. No suspicious osseous lesion. IMPRESSION: Interval improvement in left gluteal  fold skin thickening and soft tissue swelling. No soft tissue abscess. Electronically Signed   By: Caprice Renshaw M.D.   On: 02/06/2022 11:34        Scheduled Meds:  ALPRAZolam  0.5 mg Oral BID   atorvastatin  10 mg Oral Daily   busPIRone  10 mg Oral BID   cholecalciferol  2,000 Units Oral Daily   diphenhydrAMINE  50 mg Oral TID    enoxaparin (LOVENOX) injection  40 mg Subcutaneous Q24H   escitalopram  20 mg Oral Daily   famotidine  20 mg Oral BID   lamoTRIgine  200 mg Oral BID   levothyroxine  75 mcg Oral QAC breakfast   lumateperone tosylate  42 mg Oral QPM   nicotine  21 mg Transdermal Daily   nystatin cream   Topical BID   omega-3 acid ethyl esters  2 g Oral Daily   topiramate  50 mg Oral QHS   vortioxetine HBr  10 mg Oral Daily   Continuous Infusions:   ceFAZolin (ANCEF) IV 1 g (02/06/22 1323)     LOS: 3 days     Tresa Moore, MD [facility]   If 7PM-7AM, please contact night-coverage  02/06/2022, 1:28 PM

## 2022-02-07 DIAGNOSIS — L03317 Cellulitis of buttock: Secondary | ICD-10-CM | POA: Diagnosis not present

## 2022-02-07 LAB — SARS CORONAVIRUS 2 BY RT PCR: SARS Coronavirus 2 by RT PCR: NEGATIVE

## 2022-02-07 MED ORDER — HYDROMORPHONE HCL 2 MG PO TABS
4.0000 mg | ORAL_TABLET | ORAL | Status: DC | PRN
  Administered 2022-02-07 – 2022-02-11 (×19): 4 mg via ORAL
  Filled 2022-02-07 (×19): qty 2

## 2022-02-07 MED ORDER — WITCH HAZEL-GLYCERIN EX PADS
MEDICATED_PAD | CUTANEOUS | Status: DC | PRN
Start: 2022-02-07 — End: 2022-02-11
  Filled 2022-02-07: qty 100

## 2022-02-07 MED ORDER — PROMETHAZINE HCL 25 MG/ML IJ SOLN
12.5000 mg | Freq: Four times a day (QID) | INTRAMUSCULAR | Status: DC | PRN
  Administered 2022-02-07 – 2022-02-11 (×2): 12.5 mg via INTRAVENOUS
  Filled 2022-02-07: qty 12.5
  Filled 2022-02-07: qty 0.5

## 2022-02-07 MED ORDER — DIPHENHYDRAMINE-ZINC ACETATE 2-0.1 % EX CREA
TOPICAL_CREAM | Freq: Three times a day (TID) | CUTANEOUS | Status: DC | PRN
  Administered 2022-02-08: 1 via TOPICAL
  Filled 2022-02-07: qty 28

## 2022-02-07 MED ORDER — KETOROLAC TROMETHAMINE 15 MG/ML IJ SOLN
15.0000 mg | Freq: Four times a day (QID) | INTRAMUSCULAR | Status: DC
  Administered 2022-02-07 – 2022-02-09 (×7): 15 mg via INTRAVENOUS
  Filled 2022-02-07 (×7): qty 1

## 2022-02-07 NOTE — Progress Notes (Signed)
PROGRESS NOTE    Cody [name]  RCB:[id number] DOB: 10/27/1970 DOA: 02/03/2022 PCP: Joaquim Nam, MD    Brief Narrative:  51 y.o. male with medical history significant for chronic pain syndrome on morphine pump, degenerative disc disorder, hypertension, hypothyroidism, status post recent hemorrhoidectomy for thrombosed hemorrhoids with anal spasm who presents to the ER for evaluation of redness and pain involving his buttocks and scrotal area. Patient states that he had hemorrhoidectomy on 01/28/22 and within a few days postsurgery he developed a rash that has spread to involve both buttocks and scrotum.  He states that the rash was initially pruritic and he had used some hydrocortisone cream without any improvement.  He denies having any fever or chills but complains of pain with defecation which he describes as severe pain rated 10 x 10 in intensity at its worst as well as continued rectal bleeding post surgery for which he has to place dressings to prevent him from soiling his close and bedding's.  6/29: More allergic symptoms and relatively unchanged gluteal pain.  White count normal.  Renal function normal.  Remains on Ancef.  6/30: No appreciable status changes.  Patient states that area of redness has expanded and is now affecting his perennial region as well as his penis.  Repeat CT pelvis performed.  Interval improvement in cellulitic change without evidence of abscess.  7/1: Patient endorses continued pain in the perennial area.  Perineum and penis more erythematous   Assessment & Plan:   Principal Problem:   Cellulitis of gluteal region Active Problems:   Hypothyroidism   Bipolar affective (HCC)   SYNDROME, CHRONIC PAIN   Thrombosed hemorrhoids  * Cellulitis of gluteal region Patient presents to the ER for evaluation of redness and pruritus involving the gluteal area and scrotum. Imaging is consistent with cellulitis Strangely there seems to be a allergic  component Patient has had history of surgeries without history of allergic reactions but he is markedly pruritic Repeat CT pelvis on 6/30 shows interval improvement Area of erythema expanding into the penile region Plan: Continue Ancef 1 g every 8 hours Multimodal pain control Surgery following, no plans for surgical intervention Monitor cultures, no growth to date Possible allergic component, continue scheduled Benadryl 50 mg 3 times daily As needed Atarax and famotidine on board Vitals per unit protocol Daily labs Scrotal elevation Barrier cream   Hypothyroidism Stable  Continue Synthroid   Thrombosed hemorrhoids Status post recent hemorrhoidectomy Patient continues to complain of severe anal pain with defecation and bleeding H&H is stable Multimodal pain control Pain control is been challenging   SYNDROME, CHRONIC PAIN Patient has a morphine pump which will be continued during this hospitalization As needed for breakthrough pain Multimodal pain regimen Minimize IV narcotics   Bipolar affective (HCC) Stable Continue Lexapro, Caplyta, Trintellix, buspirone and Topamax     DVT prophylaxis: Lovenox Code Status: Full Family Communication: None today Disposition Plan: Status is: Inpatient Remains inpatient appropriate because: Gluteal cellulitis on intravenous antibiotics.    Level of care: Med-Surg  Consultants:  General surgery  Procedures:  None  Antimicrobials: Cefazolin   Subjective: Seen and examined.  Persistent pain in gluteal region associated with pruritus.  States that area of pain and redness now involves his penis.  Objective: Vitals:   02/06/22 1615 02/06/22 2006 02/07/22 0422 02/07/22 0805  BP: (!) 141/86 137/78 100/71 105/76  Pulse: 76 71 64 (!) 58  Resp: 18 20 16 16   Temp: 97.7 F (36.5 C) 98.1 F (36.7  C) 97.7 F (36.5 C) 97.7 F (36.5 C)  TempSrc:  Oral Oral Oral  SpO2: 96% 98% 96% 92%  Weight:      Height:         Intake/Output Summary (Last 24 hours) at 02/07/2022 1137 Last data filed at 02/07/2022 1100 Gross per 24 hour  Intake 170 ml  Output 500 ml  Net -330 ml   Filed Weights   02/03/22 1228  Weight: 88.9 kg    Examination:  General exam: Anxious Respiratory system: Lungs clear.  Normal work of breathing.  Room air Cardiovascular system: S1-S2, RRR, no murmurs, no pedal edema Gastrointestinal system: Soft, NT/ND, normal bowel sounds Central nervous system: Alert and oriented. No focal neurological deficits. Extremities: Symmetric 5 x 5 power.   Skin: Gluteal region erythematous, tender to touch, extending to perineum Psychiatry: Judgement and insight appear normal. Mood & affect appropriate.     Data Reviewed: I have personally reviewed following labs and imaging studies  CBC: Recent Labs  Lab 02/03/22 1254 02/05/22 0432 02/06/22 0947  WBC 8.8 8.3 7.2  NEUTROABS 5.4  --   --   HGB 14.6 14.9 14.9  HCT 46.1 46.9 46.7  MCV 90.2 89.0 89.8  PLT 210 202 196   Basic Metabolic Panel: Recent Labs  Lab 02/03/22 1254 02/05/22 0432 02/06/22 0947  NA 140 143 142  K 4.0 4.4 4.2  CL 107 113* 112*  CO2 25 25 25   GLUCOSE 136* 126* 108*  BUN 16 11 11   CREATININE 1.48* 1.24 1.31*  CALCIUM 8.7* 8.0* 8.5*   GFR: Estimated Creatinine Clearance: 69.2 mL/min (A) (by C-G formula based on SCr of 1.31 mg/dL (H)). Liver Function Tests: No results for input(s): "AST", "ALT", "ALKPHOS", "BILITOT", "PROT", "ALBUMIN" in the last 168 hours. No results for input(s): "LIPASE", "AMYLASE" in the last 168 hours. No results for input(s): "AMMONIA" in the last 168 hours. Coagulation Profile: No results for input(s): "INR", "PROTIME" in the last 168 hours. Cardiac Enzymes: No results for input(s): "CKTOTAL", "CKMB", "CKMBINDEX", "TROPONINI" in the last 168 hours. BNP (last 3 results) No results for input(s): "PROBNP" in the last 8760 hours. HbA1C: No results for input(s): "HGBA1C" in the last  72 hours. CBG: No results for input(s): "GLUCAP" in the last 168 hours. Lipid Profile: No results for input(s): "CHOL", "HDL", "LDLCALC", "TRIG", "CHOLHDL", "LDLDIRECT" in the last 72 hours. Thyroid Function Tests: No results for input(s): "TSH", "T4TOTAL", "FREET4", "T3FREE", "THYROIDAB" in the last 72 hours. Anemia Panel: No results for input(s): "VITAMINB12", "FOLATE", "FERRITIN", "TIBC", "IRON", "RETICCTPCT" in the last 72 hours. Sepsis Labs: No results for input(s): "PROCALCITON", "LATICACIDVEN" in the last 168 hours.  No results found for this or any previous visit (from the past 240 hour(s)).       Radiology Studies: CT PELVIS W CONTRAST  Result Date: 02/06/2022 CLINICAL DATA:  Soft tissue infection suspected, pelvis, xray done EXAM: CT PELVIS WITH CONTRAST TECHNIQUE: Multidetector CT imaging of the pelvis was performed using the standard protocol following the bolus administration of intravenous contrast. RADIATION DOSE REDUCTION: This exam was performed according to the departmental dose-optimization program which includes automated exposure control, adjustment of the mA and/or kV according to patient size and/or use of iterative reconstruction technique. CONTRAST:  OMNIPAQUE IOHEXOL 300 MG/ML  SOLN COMPARISON:  CT 02/03/2022 FINDINGS: Urinary Tract:  No abnormality visualized. Bowel:  Unremarkable visualized pelvic bowel loops. Vascular/Lymphatic: There are few prominent inguinal lymph nodes, likely reactive. No significant vascular findings. Reproductive:  Unremarkable  Other: Interval improvement in skin thickening and soft tissue swelling along the left gluteal cleft. There is no focal fluid collection. No soft tissue gas. Musculoskeletal: No acute osseous abnormality. No suspicious osseous lesion. IMPRESSION: Interval improvement in left gluteal fold skin thickening and soft tissue swelling. No soft tissue abscess. Electronically Signed   By: Caprice Renshaw M.D.   On: 02/06/2022  11:34        Scheduled Meds:  ALPRAZolam  0.5 mg Oral BID   atorvastatin  10 mg Oral Daily   busPIRone  10 mg Oral BID   cholecalciferol  2,000 Units Oral Daily   diphenhydrAMINE  50 mg Oral TID   enoxaparin (LOVENOX) injection  40 mg Subcutaneous Q24H   escitalopram  20 mg Oral Daily   famotidine  20 mg Oral BID   ketorolac  15 mg Intravenous Q6H   lamoTRIgine  200 mg Oral BID   levothyroxine  75 mcg Oral QAC breakfast   lumateperone tosylate  42 mg Oral QPM   nicotine  21 mg Transdermal Daily   nystatin cream   Topical BID   omega-3 acid ethyl esters  2 g Oral Daily   topiramate  50 mg Oral QHS   vortioxetine HBr  10 mg Oral Daily   Continuous Infusions:   ceFAZolin (ANCEF) IV 1 g (02/07/22 0526)     LOS: 4 days     Tresa Moore, MD [facility]   If 7PM-7AM, please contact night-coverage  02/07/2022, 11:37 AM

## 2022-02-08 ENCOUNTER — Other Ambulatory Visit: Payer: Self-pay | Admitting: Urology

## 2022-02-08 DIAGNOSIS — L03317 Cellulitis of buttock: Secondary | ICD-10-CM | POA: Diagnosis not present

## 2022-02-08 DIAGNOSIS — E349 Endocrine disorder, unspecified: Secondary | ICD-10-CM

## 2022-02-08 LAB — CBC WITH DIFFERENTIAL/PLATELET
Abs Immature Granulocytes: 0.08 10*3/uL — ABNORMAL HIGH (ref 0.00–0.07)
Basophils Absolute: 0.1 10*3/uL (ref 0.0–0.1)
Basophils Relative: 1 %
Eosinophils Absolute: 1.1 10*3/uL — ABNORMAL HIGH (ref 0.0–0.5)
Eosinophils Relative: 14 %
HCT: 48.8 % (ref 39.0–52.0)
Hemoglobin: 15.6 g/dL (ref 13.0–17.0)
Immature Granulocytes: 1 %
Lymphocytes Relative: 36 %
Lymphs Abs: 2.9 10*3/uL (ref 0.7–4.0)
MCH: 28.7 pg (ref 26.0–34.0)
MCHC: 32 g/dL (ref 30.0–36.0)
MCV: 89.7 fL (ref 80.0–100.0)
Monocytes Absolute: 0.8 10*3/uL (ref 0.1–1.0)
Monocytes Relative: 9 %
Neutro Abs: 3.1 10*3/uL (ref 1.7–7.7)
Neutrophils Relative %: 39 %
Platelets: 215 10*3/uL (ref 150–400)
RBC: 5.44 MIL/uL (ref 4.22–5.81)
RDW: 13.3 % (ref 11.5–15.5)
WBC: 8 10*3/uL (ref 4.0–10.5)
nRBC: 0 % (ref 0.0–0.2)

## 2022-02-08 LAB — BASIC METABOLIC PANEL
Anion gap: 5 (ref 5–15)
BUN: 13 mg/dL (ref 6–20)
CO2: 29 mmol/L (ref 22–32)
Calcium: 8.6 mg/dL — ABNORMAL LOW (ref 8.9–10.3)
Chloride: 107 mmol/L (ref 98–111)
Creatinine, Ser: 1.48 mg/dL — ABNORMAL HIGH (ref 0.61–1.24)
GFR, Estimated: 57 mL/min — ABNORMAL LOW (ref >60)
Glucose, Bld: 71 mg/dL (ref 70–99)
Potassium: 4 mmol/L (ref 3.5–5.1)
Sodium: 141 mmol/L (ref 135–145)

## 2022-02-08 NOTE — Progress Notes (Signed)
PROGRESS NOTE    Cody [name]  [id number] DOB: 07-21-1971 DOA: 02/03/2022 PCP: Tonia Ghent, MD    Brief Narrative:  51 y.o. male with medical history significant for chronic pain syndrome on morphine pump, degenerative disc disorder, hypertension, hypothyroidism, status post recent hemorrhoidectomy for thrombosed hemorrhoids with anal spasm who presents to the ER for evaluation of redness and pain involving his buttocks and scrotal area. Patient states that he had hemorrhoidectomy on 01/28/22 and within a few days postsurgery he developed a rash that has spread to involve both buttocks and scrotum.  He states that the rash was initially pruritic and he had used some hydrocortisone cream without any improvement.  He denies having any fever or chills but complains of pain with defecation which he describes as severe pain rated 10 x 10 in intensity at its worst as well as continued rectal bleeding post surgery for which he has to place dressings to prevent him from soiling his close and bedding's.  6/29: More allergic symptoms and relatively unchanged gluteal pain.  White count normal.  Renal function normal.  Remains on Ancef.  6/30: No appreciable status changes.  Patient states that area of redness has expanded and is now affecting his perennial region as well as his penis.  Repeat CT pelvis performed.  Interval improvement in cellulitic change without evidence of abscess.  7/1: Patient endorses continued pain in the perennial area.  Perineum and penis more erythematous   Assessment & Plan:   Principal Problem:   Cellulitis of gluteal region Active Problems:   Hypothyroidism   Bipolar affective ([place])   SYNDROME, CHRONIC PAIN   Thrombosed hemorrhoids  * Cellulitis of gluteal region Patient presents to the ER for evaluation of redness and pruritus involving the gluteal area and scrotum. Imaging is consistent with cellulitis Strangely there seems to be a allergic  component Patient has had history of surgeries without history of allergic reactions but he is markedly pruritic Repeat CT pelvis on 6/30 shows interval improvement Area of erythema expanding into the penile region Plan: Continue Ancef 1 g every 8 hours Multimodal pain control, minimize narcotics No plans for surgical intervention Monitor cultures, no growth to date Possible allergic component, continue scheduled Benadryl 50 mg 3 times daily Topical Benadryl added 7/1 As needed Atarax and famotidine on board Vitals per unit protocol Daily labs Scrotal elevation Barrier cream   Hypothyroidism Stable  Continue Synthroid   Thrombosed hemorrhoids Status post recent hemorrhoidectomy Patient continues to complain of severe anal pain with defecation and bleeding H&H is stable Multimodal pain control Patient opioid tolerant, pain control is been challenging   SYNDROME, CHRONIC PAIN Patient has a morphine pump which will be continued during this hospitalization As needed for breakthrough pain Multimodal pain regimen Minimize IV narcotics   Bipolar affective (HCC) Stable Continue Lexapro, Caplyta, Trintellix, buspirone and Topamax     DVT prophylaxis: Lovenox Code Status: Full Family Communication: None today Disposition Plan: Status is: Inpatient Remains inpatient appropriate because: Gluteal cellulitis on intravenous antibiotics.    Level of care: Med-Surg  Consultants:  General surgery  Procedures:  None  Antimicrobials: Cefazolin   Subjective: Seen and examined.  Persistent pain in gluteal region associated with pruritus.  States that area of pain and redness now involves his penis.  Objective: Vitals:   02/07/22 1956 02/08/22 0508 02/08/22 0511 02/08/22 0741  BP: (!) 176/91 128/71  114/75  Pulse: (!) 54 97 (!) 49 (!) 52  Resp: 18 18  16  Temp: 97.7 F (36.5 C) 97.9 F (36.6 C)  97.8 F (36.6 C)  TempSrc: Oral   Oral  SpO2: 97%  96% 97%  Weight:       Height:        Intake/Output Summary (Last 24 hours) at 02/08/2022 1141 Last data filed at 02/08/2022 1045 Gross per 24 hour  Intake 475 ml  Output --  Net 475 ml   Filed Weights   02/03/22 1228  Weight: 88.9 kg    Examination:  General exam: Anxious appearing Respiratory system: Lungs clear.  Normal work of breathing.  Room air Cardiovascular system: S1-S2, RRR, no murmurs, no pedal edema Gastrointestinal system: Soft, NT/ND, normal bowel sounds Central nervous system: Alert and oriented. No focal neurological deficits. Extremities: Symmetric 5 x 5 power.   Skin: Gluteal region erythematous, tender to touch, extending to perineum Psychiatry: Judgement and insight appear normal. Mood & affect appropriate.     Data Reviewed: I have personally reviewed following labs and imaging studies  CBC: Recent Labs  Lab 02/03/22 1254 02/05/22 0432 02/06/22 0947 02/08/22 0739  WBC 8.8 8.3 7.2 8.0  NEUTROABS 5.4  --   --  3.1  HGB 14.6 14.9 14.9 15.6  HCT 46.1 46.9 46.7 48.8  MCV 90.2 89.0 89.8 89.7  PLT 210 202 196 215   Basic Metabolic Panel: Recent Labs  Lab 02/03/22 1254 02/05/22 0432 02/06/22 0947 02/08/22 0739  NA 140 143 142 141  K 4.0 4.4 4.2 4.0  CL 107 113* 112* 107  CO2 25 25 25 29   GLUCOSE 136* 126* 108* 71  BUN 16 11 11 13   CREATININE 1.48* 1.24 1.31* 1.48*  CALCIUM 8.7* 8.0* 8.5* 8.6*   GFR: Estimated Creatinine Clearance: 61.2 mL/min (A) (by C-G formula based on SCr of 1.48 mg/dL (H)). Liver Function Tests: No results for input(s): "AST", "ALT", "ALKPHOS", "BILITOT", "PROT", "ALBUMIN" in the last 168 hours. No results for input(s): "LIPASE", "AMYLASE" in the last 168 hours. No results for input(s): "AMMONIA" in the last 168 hours. Coagulation Profile: No results for input(s): "INR", "PROTIME" in the last 168 hours. Cardiac Enzymes: No results for input(s): "CKTOTAL", "CKMB", "CKMBINDEX", "TROPONINI" in the last 168 hours. BNP (last 3 results) No  results for input(s): "PROBNP" in the last 8760 hours. HbA1C: No results for input(s): "HGBA1C" in the last 72 hours. CBG: No results for input(s): "GLUCAP" in the last 168 hours. Lipid Profile: No results for input(s): "CHOL", "HDL", "LDLCALC", "TRIG", "CHOLHDL", "LDLDIRECT" in the last 72 hours. Thyroid Function Tests: No results for input(s): "TSH", "T4TOTAL", "FREET4", "T3FREE", "THYROIDAB" in the last 72 hours. Anemia Panel: No results for input(s): "VITAMINB12", "FOLATE", "FERRITIN", "TIBC", "IRON", "RETICCTPCT" in the last 72 hours. Sepsis Labs: No results for input(s): "PROCALCITON", "LATICACIDVEN" in the last 168 hours.  Recent Results (from the past 240 hour(s))  SARS Coronavirus 2 by RT PCR (hospital order, performed in [facility] hospital lab) *cepheid single result test* Anterior Nasal Swab     Status: None   Collection Time: 02/07/22  3:25 PM   Specimen: Anterior Nasal Swab  Result Value Ref Range Status   SARS Coronavirus 2 by RT PCR NEGATIVE NEGATIVE Final    Comment: (NOTE) SARS-CoV-2 target nucleic acids are NOT DETECTED.  The SARS-CoV-2 RNA is generally detectable in upper and lower respiratory specimens during the acute phase of infection. The lowest concentration of SARS-CoV-2 viral copies this assay can detect is 250 copies / mL. A negative result does not preclude SARS-CoV-2  infection and should not be used as the sole basis for treatment or other patient management decisions.  A negative result may occur with improper specimen collection / handling, submission of specimen other than nasopharyngeal swab, presence of viral mutation(s) within the areas targeted by this assay, and inadequate number of viral copies (<250 copies / mL). A negative result must be combined with clinical observations, patient history, and epidemiological information.  Fact Sheet for Patients:   RoadLapTop.co.za  Fact Sheet for Healthcare  Providers: http://kim-miller.com/  This test is not yet approved or  cleared by the [place] FDA and has been authorized for detection and/or diagnosis of SARS-CoV-2 by FDA under an Emergency Use Authorization (EUA).  This EUA will remain in effect (meaning this test can be used) for the duration of the COVID-19 declaration under Section 564(b)(1) of the Act, 21 U.S.C. section 360bbb-3(b)(1), unless the authorization is terminated or revoked sooner.  Performed at [facility], [address] [place], [place] [address]          Radiology Studies: No results found.      Scheduled Meds:  ALPRAZolam  0.5 mg Oral BID   atorvastatin  10 mg Oral Daily   busPIRone  10 mg Oral BID   cholecalciferol  2,000 Units Oral Daily   diphenhydrAMINE  50 mg Oral TID   enoxaparin (LOVENOX) injection  40 mg Subcutaneous Q24H   escitalopram  20 mg Oral Daily   famotidine  20 mg Oral BID   ketorolac  15 mg Intravenous Q6H   lamoTRIgine  200 mg Oral BID   levothyroxine  75 mcg Oral QAC breakfast   lumateperone tosylate  42 mg Oral QPM   nicotine  21 mg Transdermal Daily   nystatin cream   Topical BID   omega-3 acid ethyl esters  2 g Oral Daily   topiramate  50 mg Oral QHS   vortioxetine HBr  10 mg Oral Daily   Continuous Infusions:   ceFAZolin (ANCEF) IV 1 g (02/08/22 0512)   promethazine (PHENERGAN) injection (IM or IVPB) Stopped (02/07/22 1837)     LOS: 5 days     Tresa Moore, MD [facility]   If 7PM-7AM, please contact night-coverage  02/08/2022, 11:41 AM

## 2022-02-09 DIAGNOSIS — L03317 Cellulitis of buttock: Secondary | ICD-10-CM | POA: Diagnosis not present

## 2022-02-09 LAB — CBC
HCT: 49.2 % (ref 39.0–52.0)
Hemoglobin: 15.8 g/dL (ref 13.0–17.0)
MCH: 28.4 pg (ref 26.0–34.0)
MCHC: 32.1 g/dL (ref 30.0–36.0)
MCV: 88.3 fL (ref 80.0–100.0)
Platelets: 205 10*3/uL (ref 150–400)
RBC: 5.57 MIL/uL (ref 4.22–5.81)
RDW: 13.3 % (ref 11.5–15.5)
WBC: 7.8 10*3/uL (ref 4.0–10.5)
nRBC: 0 % (ref 0.0–0.2)

## 2022-02-09 LAB — BASIC METABOLIC PANEL
Anion gap: 2 — ABNORMAL LOW (ref 5–15)
BUN: 16 mg/dL (ref 6–20)
CO2: 30 mmol/L (ref 22–32)
Calcium: 8.5 mg/dL — ABNORMAL LOW (ref 8.9–10.3)
Chloride: 108 mmol/L (ref 98–111)
Creatinine, Ser: 1.57 mg/dL — ABNORMAL HIGH (ref 0.61–1.24)
GFR, Estimated: 53 mL/min — ABNORMAL LOW (ref >60)
Glucose, Bld: 117 mg/dL — ABNORMAL HIGH (ref 70–99)
Potassium: 4.7 mmol/L (ref 3.5–5.1)
Sodium: 140 mmol/L (ref 135–145)

## 2022-02-09 MED ORDER — SODIUM CHLORIDE 0.9 % IV SOLN
1.0000 g | INTRAVENOUS | Status: DC
  Administered 2022-02-09 – 2022-02-10 (×2): 1 g via INTRAVENOUS
  Filled 2022-02-09 (×3): qty 10

## 2022-02-09 MED ORDER — ENSURE ENLIVE PO LIQD
237.0000 mL | Freq: Two times a day (BID) | ORAL | Status: DC
  Administered 2022-02-09 – 2022-02-10 (×3): 237 mL via ORAL

## 2022-02-09 MED ORDER — SODIUM CHLORIDE 0.9 % IV SOLN: INTRAVENOUS | Status: DC

## 2022-02-09 NOTE — TOC Initial Note (Signed)
Transition of Care [facility]) - Initial/Assessment Note    Patient Details  Name: Cody [name] MRN: [id number] Date of Birth: [birth year]  Transition of Care [facility]) CM/SW Contact:    Chapman Fitch, RN Phone Number: 02/09/2022, 3:50 PM  Clinical Narrative:                     Transition of Care (TOC) Screening Note   Patient Details  Name: Cody [name] Date of Birth: [birth year]   Transition of Care [facility]) CM/SW Contact:    Chapman Fitch, RN Phone Number: 02/09/2022, 3:50 PM    Transition of Care Department [facility]) has reviewed patient and no TOC needs have been identified at this time. We will continue to monitor patient advancement through interdisciplinary progression rounds. If new patient transition needs arise, please place a TOC consult.  Per MD no anticipated needs at dc for [facility]      Patient Goals and CMS Choice        Expected Discharge Plan and Services                                                Prior Living Arrangements/Services                       Activities of Daily Living Home Assistive Devices/Equipment: Eyeglasses ADL Screening (condition at time of admission) Patient's cognitive ability adequate to safely complete daily activities?: Yes Is the patient deaf or have difficulty hearing?: No Does the patient have difficulty seeing, even when wearing glasses/contacts?: No Does the patient have difficulty concentrating, remembering, or making decisions?: No Patient able to express need for assistance with ADLs?: Yes Does the patient have difficulty dressing or bathing?: No Independently performs ADLs?: Yes (appropriate for developmental age) Does the patient have difficulty walking or climbing stairs?: No Weakness of Legs: None Weakness of Arms/Hands: None  Permission Sought/Granted                  Emotional Assessment              Admission diagnosis:  Cellulitis of buttock [L03.317] Cellulitis of gluteal  region [L03.317] Patient Active Problem List   Diagnosis Date Noted   Cellulitis 02/04/2022   PTSD (post-traumatic stress disorder) 02/04/2022   Cellulitis of gluteal region 02/03/2022   Anal spasm 01/13/2022   Thrombosed hemorrhoids 01/09/2022   Hypertension 02/24/2021   Vaping nicotine dependence, 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   Chronic migraine with aura 11/12/2020   Chronic migraine without aura without status migrainosus, not intractable 11/12/2020   Nonintractable headache 09/01/2020   Diarrhea 06/16/2020   Medicare annual wellness visit, initial 03/04/2020   Fatty liver 07/05/2019   Obesity with body mass index greater than 30 04/27/2018   Advance care planning 09/15/2015   LLQ pain 09/15/2015   Low serum testosterone level 04/04/2011   Vitamin D deficiency 05/01/2010   CIGARETTE SMOKER 05/01/2010   HLD (hyperlipidemia) 04/01/2009   Anxiety state 04/01/2009   Bipolar affective (HCC) 04/01/2009   FASTING HYPERGLYCEMIA 04/01/2009   Hypothyroidism 04/07/2007   SYNDROME, CHRONIC PAIN 04/07/2007   PCP:  Joaquim Nam, MD Pharmacy:   [facility] [phone] - [address], [place] - [address] [place] [address] Phone: [phone] Fax: [phone]  [facility] ([facility] ) -  [place], [place] - [address] [place] [address] Phone: [phone] Fax: [phone]  [id number] [place], [place] - [address] [place] [address] Phone: [phone] Fax: [phone]     Social Determinants of Health (SDOH) Interventions    Readmission Risk Interventions     No data to display

## 2022-02-09 NOTE — Progress Notes (Signed)
PROGRESS NOTE    Cody [name]  OYD:[id number] DOB: 1971/03/04 DOA: 02/03/2022 PCP: Joaquim Nam, MD    Brief Narrative:  51 y.o. male with medical history significant for chronic pain syndrome on morphine pump, degenerative disc disorder, hypertension, hypothyroidism, status post recent hemorrhoidectomy for thrombosed hemorrhoids with anal spasm who presents to the ER for evaluation of redness and pain involving his buttocks and scrotal area. Patient states that he had hemorrhoidectomy on 01/28/22 and within a few days postsurgery he developed a rash that has spread to involve both buttocks and scrotum.  He states that the rash was initially pruritic and he had used some hydrocortisone cream without any improvement.  He denies having any fever or chills but complains of pain with defecation which he describes as severe pain rated 10 x 10 in intensity at its worst as well as continued rectal bleeding post surgery for which he has to place dressings to prevent him from soiling his close and bedding's.  6/29: More allergic symptoms and relatively unchanged gluteal pain.  White count normal.  Renal function normal.  Remains on Ancef.  6/30: No appreciable status changes.  Patient states that area of redness has expanded and is now affecting his perennial region as well as his penis.  Repeat CT pelvis performed.  Interval improvement in cellulitic change without evidence of abscess.  7/1: Patient endorses continued pain in the perennial area.  Perineum and penis more erythematous  7/3: Improving cellulitic changes.  Patient endorses more pain around his penis now.  Kidney function improving.  Assessment & Plan:   Principal Problem:   Cellulitis of gluteal region Active Problems:   Hypothyroidism   Bipolar affective (HCC)   SYNDROME, CHRONIC PAIN   Thrombosed hemorrhoids  * Cellulitis of gluteal region Patient presents to the ER for evaluation of redness and pruritus involving the  gluteal area and scrotum. Imaging is consistent with cellulitis Strangely there seems to be a allergic component Patient has had history of surgeries without history of allergic reactions but he is markedly pruritic Repeat CT pelvis on 6/30 shows interval improvement Area of erythema expanding into the penile region Plan: Change antibiotics to Rocephin for better empiric coverage Multimodal pain control, minimize narcotics No plans for surgical intervention Monitor cultures, no growth to date Possible allergic component, continue scheduled Benadryl 50 mg 3 times daily Topical Benadryl added 7/1 As needed Atarax and famotidine on board Vitals per unit protocol Daily labs Scrotal elevation Barrier cream  Acute kidney injury Suspect prerenal azotemia Patient endorses not drinking enough fluid Plan: NS 100 cc/h Daily labs   Hypothyroidism Stable  Continue Synthroid   Thrombosed hemorrhoids Status post recent hemorrhoidectomy Patient continues to complain of severe anal pain with defecation and bleeding H&H is stable Multimodal pain control Patient opioid tolerant, pain control is been challenging   SYNDROME, CHRONIC PAIN Patient has a morphine pump which will be continued during this hospitalization As needed for breakthrough pain Multimodal pain regimen Minimize IV narcotics   Bipolar affective (HCC) Stable Continue Lexapro, Caplyta, Trintellix, buspirone and Topamax     DVT prophylaxis: Lovenox Code Status: Full Family Communication: None today Disposition Plan: Status is: Inpatient Remains inpatient appropriate because: Gluteal cellulitis on intravenous antibiotics.    Level of care: Med-Surg  Consultants:  General surgery  Procedures:  None  Antimicrobials: Cefazolin   Subjective: Seen and examined.  Persistent pain in gluteal region associated with pruritus.  States that area of pain and redness now involves  his penis.  Objective: Vitals:    02/08/22 0741 02/08/22 1945 02/09/22 0508 02/09/22 0730  BP: 114/75 130/88 127/79 (!) 117/93  Pulse: (!) 52 66 (!) 58 (!) 52  Resp: 16 18 18 18   Temp: 97.8 F (36.6 C) 98.3 F (36.8 C) (!) 97.4 F (36.3 C) 97.7 F (36.5 C)  TempSrc: Oral  Oral   SpO2: 97% 97% 98% 93%  Weight:      Height:        Intake/Output Summary (Last 24 hours) at 02/09/2022 1357 Last data filed at 02/09/2022 1000 Gross per 24 hour  Intake 594.97 ml  Output --  Net 594.97 ml   Filed Weights   02/03/22 1228  Weight: 88.9 kg    Examination:  General exam: Appears anxious Respiratory system: Lungs clear.  Normal work of breathing.  Room air Cardiovascular system: S1-S2, RRR, no murmurs, no pedal edema Gastrointestinal system: Soft, NT/ND, normal bowel sounds Central nervous system: Alert and oriented. No focal neurological deficits. Extremities: Symmetric 5 x 5 power.   Skin: Gluteal region erythematous, tender to touch, extending to perineum.  Penile erythema noted. Psychiatry: Judgement and insight appear normal. Mood & affect appropriate.     Data Reviewed: I have personally reviewed following labs and imaging studies  CBC: Recent Labs  Lab 02/03/22 1254 02/05/22 0432 02/06/22 0947 02/08/22 0739 02/09/22 0756  WBC 8.8 8.3 7.2 8.0 7.8  NEUTROABS 5.4  --   --  3.1  --   HGB 14.6 14.9 14.9 15.6 15.8  HCT 46.1 46.9 46.7 48.8 49.2  MCV 90.2 89.0 89.8 89.7 88.3  PLT 210 202 196 215 [id number]   Basic Metabolic Panel: Recent Labs  Lab 02/03/22 1254 02/05/22 0432 02/06/22 0947 02/08/22 0739 02/09/22 0756  NA 140 143 142 141 140  K 4.0 4.4 4.2 4.0 4.7  CL 107 113* 112* 107 108  CO2 25 25 25 29 30   GLUCOSE 136* 126* 108* 71 117*  BUN 16 11 11 13 16   CREATININE 1.48* 1.24 1.31* 1.48* 1.57*  CALCIUM 8.7* 8.0* 8.5* 8.6* 8.5*   GFR: Estimated Creatinine Clearance: 57.7 mL/min (A) (by C-G formula based on SCr of 1.57 mg/dL (H)). Liver Function Tests: No results for input(s): "AST", "ALT",  "ALKPHOS", "BILITOT", "PROT", "ALBUMIN" in the last 168 hours. No results for input(s): "LIPASE", "AMYLASE" in the last 168 hours. No results for input(s): "AMMONIA" in the last 168 hours. Coagulation Profile: No results for input(s): "INR", "PROTIME" in the last 168 hours. Cardiac Enzymes: No results for input(s): "CKTOTAL", "CKMB", "CKMBINDEX", "TROPONINI" in the last 168 hours. BNP (last 3 results) No results for input(s): "PROBNP" in the last 8760 hours. HbA1C: No results for input(s): "HGBA1C" in the last 72 hours. CBG: No results for input(s): "GLUCAP" in the last 168 hours. Lipid Profile: No results for input(s): "CHOL", "HDL", "LDLCALC", "TRIG", "CHOLHDL", "LDLDIRECT" in the last 72 hours. Thyroid Function Tests: No results for input(s): "TSH", "T4TOTAL", "FREET4", "T3FREE", "THYROIDAB" in the last 72 hours. Anemia Panel: No results for input(s): "VITAMINB12", "FOLATE", "FERRITIN", "TIBC", "IRON", "RETICCTPCT" in the last 72 hours. Sepsis Labs: No results for input(s): "PROCALCITON", "LATICACIDVEN" in the last 168 hours.  Recent Results (from the past 240 hour(s))  SARS Coronavirus 2 by RT PCR (hospital order, performed in [facility] hospital lab) *cepheid single result test* Anterior Nasal Swab     Status: None   Collection Time: 02/07/22  3:25 PM   Specimen: Anterior Nasal Swab  Result Value Ref Range  Status   SARS Coronavirus 2 by RT PCR NEGATIVE NEGATIVE Final    Comment: (NOTE) SARS-CoV-2 target nucleic acids are NOT DETECTED.  The SARS-CoV-2 RNA is generally detectable in upper and lower respiratory specimens during the acute phase of infection. The lowest concentration of SARS-CoV-2 viral copies this assay can detect is 250 copies / mL. A negative result does not preclude SARS-CoV-2 infection and should not be used as the sole basis for treatment or other patient management decisions.  A negative result may occur with improper specimen collection / handling,  submission of specimen other than nasopharyngeal swab, presence of viral mutation(s) within the areas targeted by this assay, and inadequate number of viral copies (<250 copies / mL). A negative result must be combined with clinical observations, patient history, and epidemiological information.  Fact Sheet for Patients:   RoadLapTop.co.za  Fact Sheet for Healthcare Providers: http://kim-miller.com/  This test is not yet approved or  cleared by the [place] FDA and has been authorized for detection and/or diagnosis of SARS-CoV-2 by FDA under an Emergency Use Authorization (EUA).  This EUA will remain in effect (meaning this test can be used) for the duration of the COVID-19 declaration under Section 564(b)(1) of the Act, 21 U.S.C. section 360bbb-3(b)(1), unless the authorization is terminated or revoked sooner.  Performed at [facility], [address] [place], [place] [address]          Radiology Studies: No results found.      Scheduled Meds:  ALPRAZolam  0.5 mg Oral BID   atorvastatin  10 mg Oral Daily   busPIRone  10 mg Oral BID   cholecalciferol  2,000 Units Oral Daily   diphenhydrAMINE  50 mg Oral TID   enoxaparin (LOVENOX) injection  40 mg Subcutaneous Q24H   escitalopram  20 mg Oral Daily   famotidine  20 mg Oral BID   feeding supplement  237 mL Oral BID BM   lamoTRIgine  200 mg Oral BID   levothyroxine  75 mcg Oral QAC breakfast   lumateperone tosylate  42 mg Oral QPM   nicotine  21 mg Transdermal Daily   nystatin cream   Topical BID   omega-3 acid ethyl esters  2 g Oral Daily   topiramate  50 mg Oral QHS   vortioxetine HBr  10 mg Oral Daily   Continuous Infusions:  sodium chloride 100 mL/hr at 02/09/22 1351   cefTRIAXone (ROCEPHIN)  IV 1 g (02/09/22 1354)   promethazine (PHENERGAN) injection (IM or IVPB) Stopped (02/07/22 1837)     LOS: 6 days     Cody Moore, MD [facility]   If 7PM-7AM, please contact night-coverage  02/09/2022, 1:57 PM

## 2022-02-09 NOTE — Progress Notes (Deleted)
PROGRESS NOTE    Cody [name]  IEP:[id number] DOB: 07/01/1971 DOA: 02/03/2022 PCP: Joaquim Nam, MD    Brief Narrative:  51 y.o. male with medical history significant for chronic pain syndrome on morphine pump, degenerative disc disorder, hypertension, hypothyroidism, status post recent hemorrhoidectomy for thrombosed hemorrhoids with anal spasm who presents to the ER for evaluation of redness and pain involving his buttocks and scrotal area. Patient states that he had hemorrhoidectomy on 01/28/22 and within a few days postsurgery he developed a rash that has spread to involve both buttocks and scrotum.  He states that the rash was initially pruritic and he had used some hydrocortisone cream without any improvement.  He denies having any fever or chills but complains of pain with defecation which he describes as severe pain rated 10 x 10 in intensity at its worst as well as continued rectal bleeding post surgery for which he has to place dressings to prevent him from soiling his close and bedding's.  6/29: More allergic symptoms and relatively unchanged gluteal pain.  White count normal.  Renal function normal.  Remains on Ancef.  6/30: No appreciable status changes.  Patient states that area of redness has expanded and is now affecting his perennial region as well as his penis.  Repeat CT pelvis performed.  Interval improvement in cellulitic change without evidence of abscess.  7/1: Patient endorses continued pain in the perennial area.  Perineum and penis more erythematous   Assessment & Plan:   Principal Problem:   Cellulitis of gluteal region Active Problems:   Hypothyroidism   Bipolar affective (HCC)   SYNDROME, CHRONIC PAIN   Thrombosed hemorrhoids  * Cellulitis of gluteal region Patient presents to the ER for evaluation of redness and pruritus involving the gluteal area and scrotum. Imaging is consistent with cellulitis Strangely there seems to be a allergic  component Patient has had history of surgeries without history of allergic reactions but he is markedly pruritic Repeat CT pelvis on 6/30 shows interval improvement Area of erythema expanding into the penile region Plan: Continue Ancef 1 g every 8 hours Multimodal pain control, minimize narcotics No plans for surgical intervention Monitor cultures, no growth to date Possible allergic component, continue scheduled Benadryl 50 mg 3 times daily Topical Benadryl added 7/1 As needed Atarax and famotidine on board Vitals per unit protocol Daily labs Scrotal elevation Barrier cream   Hypothyroidism Stable  Continue Synthroid   Thrombosed hemorrhoids Status post recent hemorrhoidectomy Patient continues to complain of severe anal pain with defecation and bleeding H&H is stable Multimodal pain control Patient opioid tolerant, pain control is been challenging   SYNDROME, CHRONIC PAIN Patient has a morphine pump which will be continued during this hospitalization As needed for breakthrough pain Multimodal pain regimen Minimize IV narcotics   Bipolar affective (HCC) Stable Continue Lexapro, Caplyta, Trintellix, buspirone and Topamax     DVT prophylaxis: Lovenox Code Status: Full Family Communication: None today Disposition Plan: Status is: Inpatient Remains inpatient appropriate because: Gluteal cellulitis on intravenous antibiotics.    Level of care: Med-Surg  Consultants:  General surgery  Procedures:  None  Antimicrobials: Cefazolin   Subjective: Seen and examined.  Persistent pain in gluteal region associated with pruritus.  States that area of pain and redness now involves his penis.  Objective: Vitals:   02/08/22 0741 02/08/22 1945 02/09/22 0508 02/09/22 0730  BP: 114/75 130/88 127/79 (!) 117/93  Pulse: (!) 52 66 (!) 58 (!) 52  Resp: 16 18 18  18  Temp: 97.8 F (36.6 C) 98.3 F (36.8 C) (!) 97.4 F (36.3 C) 97.7 F (36.5 C)  TempSrc: Oral  Oral    SpO2: 97% 97% 98% 93%  Weight:      Height:        Intake/Output Summary (Last 24 hours) at 02/09/2022 1315 Last data filed at 02/09/2022 1000 Gross per 24 hour  Intake 594.97 ml  Output --  Net 594.97 ml   Filed Weights   02/03/22 1228  Weight: 88.9 kg    Examination:  General exam: Anxious appearing Respiratory system: Lungs clear.  Normal work of breathing.  Room air Cardiovascular system: S1-S2, RRR, no murmurs, no pedal edema Gastrointestinal system: Soft, NT/ND, normal bowel sounds Central nervous system: Alert and oriented. No focal neurological deficits. Extremities: Symmetric 5 x 5 power.   Skin: Gluteal region erythematous, tender to touch, extending to perineum Psychiatry: Judgement and insight appear normal. Mood & affect appropriate.     Data Reviewed: I have personally reviewed following labs and imaging studies  CBC: Recent Labs  Lab 02/03/22 1254 02/05/22 0432 02/06/22 0947 02/08/22 0739 02/09/22 0756  WBC 8.8 8.3 7.2 8.0 7.8  NEUTROABS 5.4  --   --  3.1  --   HGB 14.6 14.9 14.9 15.6 15.8  HCT 46.1 46.9 46.7 48.8 49.2  MCV 90.2 89.0 89.8 89.7 88.3  PLT 210 202 196 215 205   Basic Metabolic Panel: Recent Labs  Lab 02/03/22 1254 02/05/22 0432 02/06/22 0947 02/08/22 0739 02/09/22 0756  NA 140 143 142 141 140  K 4.0 4.4 4.2 4.0 4.7  CL 107 113* 112* 107 108  CO2 25 25 25 29 30   GLUCOSE 136* 126* 108* 71 117*  BUN 16 11 11 13 16   CREATININE 1.48* 1.24 1.31* 1.48* 1.57*  CALCIUM 8.7* 8.0* 8.5* 8.6* 8.5*   GFR: Estimated Creatinine Clearance: 57.7 mL/min (A) (by C-G formula based on SCr of 1.57 mg/dL (H)). Liver Function Tests: No results for input(s): "AST", "ALT", "ALKPHOS", "BILITOT", "PROT", "ALBUMIN" in the last 168 hours. No results for input(s): "LIPASE", "AMYLASE" in the last 168 hours. No results for input(s): "AMMONIA" in the last 168 hours. Coagulation Profile: No results for input(s): "INR", "PROTIME" in the last 168  hours. Cardiac Enzymes: No results for input(s): "CKTOTAL", "CKMB", "CKMBINDEX", "TROPONINI" in the last 168 hours. BNP (last 3 results) No results for input(s): "PROBNP" in the last 8760 hours. HbA1C: No results for input(s): "HGBA1C" in the last 72 hours. CBG: No results for input(s): "GLUCAP" in the last 168 hours. Lipid Profile: No results for input(s): "CHOL", "HDL", "LDLCALC", "TRIG", "CHOLHDL", "LDLDIRECT" in the last 72 hours. Thyroid Function Tests: No results for input(s): "TSH", "T4TOTAL", "FREET4", "T3FREE", "THYROIDAB" in the last 72 hours. Anemia Panel: No results for input(s): "VITAMINB12", "FOLATE", "FERRITIN", "TIBC", "IRON", "RETICCTPCT" in the last 72 hours. Sepsis Labs: No results for input(s): "PROCALCITON", "LATICACIDVEN" in the last 168 hours.  Recent Results (from the past 240 hour(s))  SARS Coronavirus 2 by RT PCR (hospital order, performed in [facility] hospital lab) *cepheid single result test* Anterior Nasal Swab     Status: None   Collection Time: 02/07/22  3:25 PM   Specimen: Anterior Nasal Swab  Result Value Ref Range Status   SARS Coronavirus 2 by RT PCR NEGATIVE NEGATIVE Final    Comment: (NOTE) SARS-CoV-2 target nucleic acids are NOT DETECTED.  The SARS-CoV-2 RNA is generally detectable in upper and lower respiratory specimens during the acute phase of  infection. The lowest concentration of SARS-CoV-2 viral copies this assay can detect is 250 copies / mL. A negative result does not preclude SARS-CoV-2 infection and should not be used as the sole basis for treatment or other patient management decisions.  A negative result may occur with improper specimen collection / handling, submission of specimen other than nasopharyngeal swab, presence of viral mutation(s) within the areas targeted by this assay, and inadequate number of viral copies (<250 copies / mL). A negative result must be combined with clinical observations, patient history, and  epidemiological information.  Fact Sheet for Patients:   RoadLapTop.co.za  Fact Sheet for Healthcare Providers: http://kim-miller.com/  This test is not yet approved or  cleared by the [place] FDA and has been authorized for detection and/or diagnosis of SARS-CoV-2 by FDA under an Emergency Use Authorization (EUA).  This EUA will remain in effect (meaning this test can be used) for the duration of the COVID-19 declaration under Section 564(b)(1) of the Act, 21 U.S.C. section 360bbb-3(b)(1), unless the authorization is terminated or revoked sooner.  Performed at [facility], [address] [place], [place] [address]          Radiology Studies: No results found.      Scheduled Meds:  ALPRAZolam  0.5 mg Oral BID   atorvastatin  10 mg Oral Daily   busPIRone  10 mg Oral BID   cholecalciferol  2,000 Units Oral Daily   diphenhydrAMINE  50 mg Oral TID   enoxaparin (LOVENOX) injection  40 mg Subcutaneous Q24H   escitalopram  20 mg Oral Daily   famotidine  20 mg Oral BID   lamoTRIgine  200 mg Oral BID   levothyroxine  75 mcg Oral QAC breakfast   lumateperone tosylate  42 mg Oral QPM   nicotine  21 mg Transdermal Daily   nystatin cream   Topical BID   omega-3 acid ethyl esters  2 g Oral Daily   topiramate  50 mg Oral QHS   vortioxetine HBr  10 mg Oral Daily   Continuous Infusions:   ceFAZolin (ANCEF) IV 1 g (02/09/22 0506)   promethazine (PHENERGAN) injection (IM or IVPB) Stopped (02/07/22 1837)     LOS: 6 days     Tresa Moore, MD [facility]   If 7PM-7AM, please contact night-coverage  02/09/2022, 1:15 PM

## 2022-02-10 DIAGNOSIS — L03317 Cellulitis of buttock: Secondary | ICD-10-CM | POA: Diagnosis not present

## 2022-02-10 LAB — CBC WITH DIFFERENTIAL/PLATELET
Abs Immature Granulocytes: 0.06 10*3/uL (ref 0.00–0.07)
Basophils Absolute: 0.1 10*3/uL (ref 0.0–0.1)
Basophils Relative: 1 %
Eosinophils Absolute: 1 10*3/uL — ABNORMAL HIGH (ref 0.0–0.5)
Eosinophils Relative: 12 %
HCT: 45.2 % (ref 39.0–52.0)
Hemoglobin: 14.4 g/dL (ref 13.0–17.0)
Immature Granulocytes: 1 %
Lymphocytes Relative: 38 %
Lymphs Abs: 3 10*3/uL (ref 0.7–4.0)
MCH: 28.6 pg (ref 26.0–34.0)
MCHC: 31.9 g/dL (ref 30.0–36.0)
MCV: 89.9 fL (ref 80.0–100.0)
Monocytes Absolute: 0.6 10*3/uL (ref 0.1–1.0)
Monocytes Relative: 8 %
Neutro Abs: 3.2 10*3/uL (ref 1.7–7.7)
Neutrophils Relative %: 40 %
Platelets: 183 10*3/uL (ref 150–400)
RBC: 5.03 MIL/uL (ref 4.22–5.81)
RDW: 13.4 % (ref 11.5–15.5)
WBC: 8 10*3/uL (ref 4.0–10.5)
nRBC: 0 % (ref 0.0–0.2)

## 2022-02-10 LAB — BASIC METABOLIC PANEL
Anion gap: 4 — ABNORMAL LOW (ref 5–15)
BUN: 13 mg/dL (ref 6–20)
CO2: 29 mmol/L (ref 22–32)
Calcium: 8.2 mg/dL — ABNORMAL LOW (ref 8.9–10.3)
Chloride: 108 mmol/L (ref 98–111)
Creatinine, Ser: 1.59 mg/dL — ABNORMAL HIGH (ref 0.61–1.24)
GFR, Estimated: 53 mL/min — ABNORMAL LOW (ref >60)
Glucose, Bld: 128 mg/dL — ABNORMAL HIGH (ref 70–99)
Potassium: 4.6 mmol/L (ref 3.5–5.1)
Sodium: 141 mmol/L (ref 135–145)

## 2022-02-10 MED ORDER — SENNOSIDES-DOCUSATE SODIUM 8.6-50 MG PO TABS
1.0000 | ORAL_TABLET | Freq: Two times a day (BID) | ORAL | Status: DC
  Administered 2022-02-10 (×2): 1 via ORAL
  Filled 2022-02-10 (×3): qty 1

## 2022-02-10 MED ORDER — POLYETHYLENE GLYCOL 3350 17 G PO PACK
17.0000 g | PACK | Freq: Every day | ORAL | Status: DC
  Administered 2022-02-10: 17 g via ORAL
  Filled 2022-02-10: qty 1

## 2022-02-10 NOTE — Progress Notes (Signed)
       CROSS COVER NOTE  NAME: Cody [name] MRN: [id number] DOB : [birth year] ATTENDING PHYSICIAN: @Attending @   Notified by nursing that patient had an unwitnessed fall.  No loss of consciousness or change in sensorium that precipitated the fall.  [name] is independent of ADLs and when ambulating. Per report his laptop fell off of his bedside table and he fell reaching for the laptop. Patient is reporting mild pain of his tail bone that will be managed with ordered PRN pain meds and supportive care.  No further investigative work-up  at this time, we will continue to monitor and consider testing if patient tailbone pain worsens or he experiences a change from neurological baseline.  This document was prepared using Dragon voice recognition software and may include unintentional dictation errors.  Max Sane DNP, MHA, FNP-BC Nurse Practitioner [facility] Pager [phone]

## 2022-02-10 NOTE — Progress Notes (Signed)
PROGRESS NOTE    Cody [name]  ZOX:[id number] DOB: 11/28/1970 DOA: 02/03/2022 PCP: Joaquim Nam, MD    Brief Narrative:  51 y.o. male with medical history significant for chronic pain syndrome on morphine pump, degenerative disc disorder, hypertension, hypothyroidism, status post recent hemorrhoidectomy for thrombosed hemorrhoids with anal spasm who presents to the ER for evaluation of redness and pain involving his buttocks and scrotal area. Patient states that he had hemorrhoidectomy on 01/28/22 and within a few days postsurgery he developed a rash that has spread to involve both buttocks and scrotum.  He states that the rash was initially pruritic and he had used some hydrocortisone cream without any improvement.  He denies having any fever or chills but complains of pain with defecation which he describes as severe pain rated 10 x 10 in intensity at its worst as well as continued rectal bleeding post surgery for which he has to place dressings to prevent him from soiling his close and bedding's.  6/29: More allergic symptoms and relatively unchanged gluteal pain.  White count normal.  Renal function normal.  Remains on Ancef.  6/30: No appreciable status changes.  Patient states that area of redness has expanded and is now affecting his perennial region as well as his penis.  Repeat CT pelvis performed.  Interval improvement in cellulitic change without evidence of abscess.  7/1: Patient endorses continued pain in the perennial area.  Perineum and penis more erythematous  7/3: Improving cellulitic changes.  Patient endorses more pain around his penis now.  Kidney function improving.  7/4: Improving cellulitic changes.  Kidney function worsening  Assessment & Plan:   Principal Problem:   Cellulitis of gluteal region Active Problems:   Hypothyroidism   Bipolar affective (HCC)   SYNDROME, CHRONIC PAIN   Thrombosed hemorrhoids  * Cellulitis of gluteal region Patient presents to  the ER for evaluation of redness and pruritus involving the gluteal area and scrotum. Imaging is consistent with cellulitis Strangely there seems to be a allergic component Patient has had history of surgeries without history of allergic reactions but he is markedly pruritic Repeat CT pelvis on 6/30 shows interval improvement Area of erythema expanding into the penile region Plan: Continue Rocephin for now Today is day 7, consider discontinuation tomorrow Multimodal pain control, minimize use of breakthrough narcotic Monitor cultures, no growth to date Continue scheduled Benadryl Continue topical Benadryl As needed Atarax and famotidine Daily labs Possible discharge 7/5 if kidney function improving   Acute kidney injury Possibly some element of chronic kidney disease Unclear nature of azotemia Creatinine worsening despite IV fluids Plan: Continue NS 100 cc/h Daily labs Possible discharge 7/5 creatinine improving If not consider nephrology involvement   Hypothyroidism Stable  Continue Synthroid   Thrombosed hemorrhoids Status post recent hemorrhoidectomy Patient continues to complain of severe anal pain with defecation and bleeding H&H is stable Multimodal pain control Patient opioid tolerant, pain control is been challenging   SYNDROME, CHRONIC PAIN Patient has a morphine pump which will be continued during this hospitalization As needed for breakthrough pain Multimodal pain regimen Minimize IV narcotics   Bipolar affective (HCC) Stable Continue Lexapro, Caplyta, Trintellix, buspirone and Topamax     DVT prophylaxis: Lovenox Code Status: Full Family Communication: Wife at bedside 7/4 Disposition Plan: Status is: Inpatient Remains inpatient appropriate because: Gluteal cellulitis on intravenous antibiotics.    Level of care: Med-Surg  Consultants:   Procedures:  None  Antimicrobials: Ceftriaxone   Subjective: Seen and examined.  More  comfortable  appearing this morning.  Still with some redness on inner thighs and they are perennial.  Objective: Vitals:   02/09/22 1644 02/09/22 2025 02/10/22 0559 02/10/22 0857  BP: (!) 150/78 (!) 146/90 114/75 118/66  Pulse: 65 68 (!) 55 (!) 53  Resp: 18 16 14 18   Temp: 98 F (36.7 C) 98 F (36.7 C) (!) 97.5 F (36.4 C)   TempSrc:  Oral Oral   SpO2: 95% 97% 96% 97%  Weight:      Height:        Intake/Output Summary (Last 24 hours) at 02/10/2022 1457 Last data filed at 02/10/2022 1429 Gross per 24 hour  Intake 100 ml  Output --  Net 100 ml   Filed Weights   02/03/22 1228  Weight: 88.9 kg    Examination:  General exam: NAD Respiratory system: Lungs clear.  Normal work of breathing.  Room air Cardiovascular system: S1-S2, RRR, no murmurs, no pedal edema Gastrointestinal system: Soft, NT/ND, normal bowel sounds Central nervous system: Alert and oriented. No focal neurological deficits. Extremities: Symmetric 5 x 5 power.   Skin: Gluteal region erythematous, tender to touch, extending to perineum.  Penile erythema noted.  Improving over interval Psychiatry: Judgement and insight appear normal. Mood & affect appropriate.     Data Reviewed: I have personally reviewed following labs and imaging studies  CBC: Recent Labs  Lab 02/05/22 0432 02/06/22 0947 02/08/22 0739 02/09/22 0756 02/10/22 0803  WBC 8.3 7.2 8.0 7.8 8.0  NEUTROABS  --   --  3.1  --  3.2  HGB 14.9 14.9 15.6 15.8 14.4  HCT 46.9 46.7 48.8 49.2 45.2  MCV 89.0 89.8 89.7 88.3 89.9  PLT 202 196 215 205 183   Basic Metabolic Panel: Recent Labs  Lab 02/05/22 0432 02/06/22 0947 02/08/22 0739 02/09/22 0756 02/10/22 0803  NA 143 142 141 140 141  K 4.4 4.2 4.0 4.7 4.6  CL 113* 112* 107 108 108  CO2 25 25 29 30 29   GLUCOSE 126* 108* 71 117* 128*  BUN 11 11 13 16 13   CREATININE 1.24 1.31* 1.48* 1.57* 1.59*  CALCIUM 8.0* 8.5* 8.6* 8.5* 8.2*   GFR: Estimated Creatinine Clearance: 57 mL/min (A) (by C-G formula  based on SCr of 1.59 mg/dL (H)). Liver Function Tests: No results for input(s): "AST", "ALT", "ALKPHOS", "BILITOT", "PROT", "ALBUMIN" in the last 168 hours. No results for input(s): "LIPASE", "AMYLASE" in the last 168 hours. No results for input(s): "AMMONIA" in the last 168 hours. Coagulation Profile: No results for input(s): "INR", "PROTIME" in the last 168 hours. Cardiac Enzymes: No results for input(s): "CKTOTAL", "CKMB", "CKMBINDEX", "TROPONINI" in the last 168 hours. BNP (last 3 results) No results for input(s): "PROBNP" in the last 8760 hours. HbA1C: No results for input(s): "HGBA1C" in the last 72 hours. CBG: No results for input(s): "GLUCAP" in the last 168 hours. Lipid Profile: No results for input(s): "CHOL", "HDL", "LDLCALC", "TRIG", "CHOLHDL", "LDLDIRECT" in the last 72 hours. Thyroid Function Tests: No results for input(s): "TSH", "T4TOTAL", "FREET4", "T3FREE", "THYROIDAB" in the last 72 hours. Anemia Panel: No results for input(s): "VITAMINB12", "FOLATE", "FERRITIN", "TIBC", "IRON", "RETICCTPCT" in the last 72 hours. Sepsis Labs: No results for input(s): "PROCALCITON", "LATICACIDVEN" in the last 168 hours.  Recent Results (from the past 240 hour(s))  SARS Coronavirus 2 by RT PCR (hospital order, performed in [facility] hospital lab) *cepheid single result test* Anterior Nasal Swab     Status: None   Collection Time: 02/07/22  3:25 PM   Specimen: Anterior Nasal Swab  Result Value Ref Range Status   SARS Coronavirus 2 by RT PCR NEGATIVE NEGATIVE Final    Comment: (NOTE) SARS-CoV-2 target nucleic acids are NOT DETECTED.  The SARS-CoV-2 RNA is generally detectable in upper and lower respiratory specimens during the acute phase of infection. The lowest concentration of SARS-CoV-2 viral copies this assay can detect is 250 copies / mL. A negative result does not preclude SARS-CoV-2 infection and should not be used as the sole basis for treatment or other patient  management decisions.  A negative result may occur with improper specimen collection / handling, submission of specimen other than nasopharyngeal swab, presence of viral mutation(s) within the areas targeted by this assay, and inadequate number of viral copies (<250 copies / mL). A negative result must be combined with clinical observations, patient history, and epidemiological information.  Fact Sheet for Patients:   RoadLapTop.co.za  Fact Sheet for Healthcare Providers: http://kim-miller.com/  This test is not yet approved or  cleared by the [place] FDA and has been authorized for detection and/or diagnosis of SARS-CoV-2 by FDA under an Emergency Use Authorization (EUA).  This EUA will remain in effect (meaning this test can be used) for the duration of the COVID-19 declaration under Section 564(b)(1) of the Act, 21 U.S.C. section 360bbb-3(b)(1), unless the authorization is terminated or revoked sooner.  Performed at [facility], [address] [place], [place] [address]          Radiology Studies: No results found.      Scheduled Meds:  ALPRAZolam  0.5 mg Oral BID   atorvastatin  10 mg Oral Daily   busPIRone  10 mg Oral BID   cholecalciferol  2,000 Units Oral Daily   diphenhydrAMINE  50 mg Oral TID   enoxaparin (LOVENOX) injection  40 mg Subcutaneous Q24H   escitalopram  20 mg Oral Daily   famotidine  20 mg Oral BID   feeding supplement  237 mL Oral BID BM   lamoTRIgine  200 mg Oral BID   levothyroxine  75 mcg Oral QAC breakfast   lumateperone tosylate  42 mg Oral QPM   nicotine  21 mg Transdermal Daily   omega-3 acid ethyl esters  2 g Oral Daily   polyethylene glycol  17 g Oral Daily   senna-docusate  1 tablet Oral BID   topiramate  50 mg Oral QHS   vortioxetine HBr  10 mg Oral Daily   Continuous Infusions:  sodium chloride Stopped (02/09/22 1354)   cefTRIAXone (ROCEPHIN)  IV 1 g (02/10/22  1333)   promethazine (PHENERGAN) injection (IM or IVPB) Stopped (02/07/22 1837)     LOS: 7 days     Tresa Moore, MD [facility]   If 7PM-7AM, please contact night-coverage  02/10/2022, 2:57 PM

## 2022-02-11 DIAGNOSIS — L03317 Cellulitis of buttock: Secondary | ICD-10-CM | POA: Diagnosis not present

## 2022-02-11 LAB — CBC WITH DIFFERENTIAL/PLATELET
Abs Immature Granulocytes: 0.07 10*3/uL (ref 0.00–0.07)
Basophils Absolute: 0.1 10*3/uL (ref 0.0–0.1)
Basophils Relative: 1 %
Eosinophils Absolute: 1 10*3/uL — ABNORMAL HIGH (ref 0.0–0.5)
Eosinophils Relative: 11 %
HCT: 45.5 % (ref 39.0–52.0)
Hemoglobin: 14.7 g/dL (ref 13.0–17.0)
Immature Granulocytes: 1 %
Lymphocytes Relative: 34 %
Lymphs Abs: 3.1 10*3/uL (ref 0.7–4.0)
MCH: 28.8 pg (ref 26.0–34.0)
MCHC: 32.3 g/dL (ref 30.0–36.0)
MCV: 89 fL (ref 80.0–100.0)
Monocytes Absolute: 0.6 10*3/uL (ref 0.1–1.0)
Monocytes Relative: 7 %
Neutro Abs: 4.5 10*3/uL (ref 1.7–7.7)
Neutrophils Relative %: 46 %
Platelets: 196 10*3/uL (ref 150–400)
RBC: 5.11 MIL/uL (ref 4.22–5.81)
RDW: 13.2 % (ref 11.5–15.5)
WBC: 9.4 10*3/uL (ref 4.0–10.5)
nRBC: 0 % (ref 0.0–0.2)

## 2022-02-11 LAB — BASIC METABOLIC PANEL
Anion gap: 4 — ABNORMAL LOW (ref 5–15)
BUN: 12 mg/dL (ref 6–20)
CO2: 25 mmol/L (ref 22–32)
Calcium: 8.5 mg/dL — ABNORMAL LOW (ref 8.9–10.3)
Chloride: 111 mmol/L (ref 98–111)
Creatinine, Ser: 1.24 mg/dL (ref 0.61–1.24)
GFR, Estimated: >60 mL/min (ref >60)
Glucose, Bld: 130 mg/dL — ABNORMAL HIGH (ref 70–99)
Potassium: 3.9 mmol/L (ref 3.5–5.1)
Sodium: 140 mmol/L (ref 135–145)

## 2022-02-11 LAB — GLUCOSE, CAPILLARY: Glucose-Capillary: 88 mg/dL (ref 70–99)

## 2022-02-11 MED ORDER — TESTOSTERONE CYPIONATE 100 MG/ML IM SOLN
100.0000 mg | Freq: Once | INTRAMUSCULAR | Status: DC
Start: 2022-02-11 — End: 2022-02-11

## 2022-02-11 MED ORDER — TESTOSTERONE CYPIONATE 200 MG/ML IM SOLN
100.0000 mg | Freq: Once | INTRAMUSCULAR | Status: AC
Start: 2022-02-11 — End: 2022-02-11
  Administered 2022-02-11: 100 mg via INTRAMUSCULAR

## 2022-02-11 NOTE — Progress Notes (Signed)
Patient discharged to home with family.  Escorted patient to ppharmacy to pick up home meds.  Reviewed discharge instructions with patient who verbalized understanding.  Piv removed.

## 2022-02-11 NOTE — Discharge Summary (Signed)
Physician Discharge Summary  Cody [name] JPV:[id number] DOB: 1970/09/15 DOA: 02/03/2022  PCP: Joaquim Nam, MD  Admit date: 02/03/2022 Discharge date: 02/11/2022  Admitted From: Home Disposition: Home  Recommendations for Outpatient Follow-up:  Follow up with PCP in 1-2 weeks Please obtain BMP/CBC in one week   Home Health: N/A Equipment/Devices: N/A  Discharge Condition: Stable CODE STATUS: Full code Diet recommendation: Regular diet  Discharge summary:  51 year old gentleman with chronic pain syndrome on morphine pump and oral Dilaudid, hypertension, hypothyroidism who recently underwent hemorrhoidectomy for thrombosed hemorrhoids and anal spasm presented back to the ER with redness and pain involving his buttocks and scrotal area and spreading rash.  Seen by surgery.  Advised to treat conservatively with antibiotics and antipruritics.  He did have expanding erythematous area of the perineum and penis that improved with conservative management.  Developed acute kidney injury while in the hospital that has ultimately improved.  After prolonged treatment in the hospital with IV antibiotics and IV fluids, received 7 days of IV antibiotics as suggested by surgery.  Kidney functions normalized.  Discharged home with resuming all his home medications including multimodal pain regimen, benzodiazepines, Synthroid, his multiple medications for bipolar affective disorder including Lexapro, Trintellix, BuSpar and Topamax.   Discharge Diagnoses:  Principal Problem:   Cellulitis of gluteal region Active Problems:   Hypothyroidism   Bipolar affective (HCC)   SYNDROME, CHRONIC PAIN   Thrombosed hemorrhoids    Discharge Instructions  Discharge Instructions     Call MD for:  redness, tenderness, or signs of infection (pain, swelling, redness, odor or green/yellow discharge around incision site)   Complete by: As directed    Call MD for:  severe uncontrolled pain   Complete by: As  directed    Diet general   Complete by: As directed    Increase activity slowly   Complete by: As directed    No wound care   Complete by: As directed       Allergies as of 02/11/2022       Reactions   Neurontin [gabapentin]    lethargic   Vancomycin    Swelling    Doxycycline    vomiting   Lithium    tremor   Mirtazapine    REACTION: lethargy ( Remeron)        Medication List     STOP taking these medications    cephALEXin 500 MG capsule Commonly known as: KEFLEX   fluconazole 200 MG tablet Commonly known as: DIFLUCAN   oxyCODONE 5 MG immediate release tablet Commonly known as: Oxy IR/ROXICODONE   Proctofoam HC rectal foam Generic drug: hydrocortisone-pramoxine       TAKE these medications    acetaminophen 500 MG tablet Commonly known as: TYLENOL Take 1,000 mg by mouth every 6 (six) hours as needed for mild pain.   ALPRAZolam 0.5 MG tablet Commonly known as: XANAX Take 1 mg by mouth 2 (two) times daily as needed for anxiety or sleep.   AMBULATORY NON FORMULARY MEDICATION 8.608 mg by Intrathecal Infusion route daily. Morphine Pump Implant   atorvastatin 10 MG tablet Commonly known as: LIPITOR Take 1 tablet (10 mg total) by mouth daily. NEEDS TO SCHEDULE PHYSICAL   B-D 3CC LUER-LOK SYR 21GX1-1/2 21G X 1-1/2" 3 ML Misc Generic drug: SYRINGE-NEEDLE (DISP) 3 ML Use this needle to injection   BD SafetyGlide Needle 18G X 1-1/2" Misc Generic drug: NEEDLE (DISP) 18 G Draw up   busPIRone 10 MG tablet Commonly known as:  BUSPAR Take 1 tablet (10 mg total) by mouth 2 (two) times daily.   Caplyta 42 MG capsule Generic drug: lumateperone tosylate Take 42 mg by mouth daily.   Emgality 120 MG/ML Soaj Generic drug: Galcanezumab-gnlm Inject 120 mg into the skin every 30 (thirty) days.   escitalopram 20 MG tablet Commonly known as: Lexapro Take 1 tablet (20 mg total) by mouth daily.   HYDROmorphone 2 MG tablet Commonly known as: DILAUDID Take 2 mg  by mouth 4 (four) times daily as needed.   hydrOXYzine 50 MG tablet Commonly known as: ATARAX Take 50 mg by mouth as needed. BID   ibuprofen 800 MG tablet Commonly known as: ADVIL Take 1 tablet (800 mg total) by mouth every 8 (eight) hours as needed.   lamoTRIgine 200 MG tablet Commonly known as: LAMICTAL Take 1 tablet (200 mg total) by mouth 2 (two) times daily.   levothyroxine 75 MCG tablet Commonly known as: SYNTHROID TAKE 1 TABLET BY MOUTH DAILY  BEFORE BREAKFAST   Omega-3 1000 MG Caps Take 2 each by mouth daily.   OVER THE COUNTER MEDICATION Take 1 Piece by mouth daily. CBD gummy (contains small amount of THC)   polyethylene glycol powder 17 GM/SCOOP powder Commonly known as: GLYCOLAX/MIRALAX Take 17 g by mouth every other day as needed.   promethazine 25 MG tablet Commonly known as: PHENERGAN TAKE 1/2 TO 1 TABLET BY MOUTH EVERY 8 HOURS AS NEEDED FOR NAUSEA AND VOMITING   SUMAtriptan 100 MG tablet Commonly known as: IMITREX Take 1 tablet (100 mg total) by mouth every 2 (two) hours as needed for migraine (max 2 doses in 24 hours.). May repeat in 2 hours if headache persists or recurs.   tadalafil 5 MG tablet Commonly known as: CIALIS Take 1 tablet (5 mg total) by mouth daily as needed.   testosterone cypionate 200 MG/ML injection Commonly known as: DEPOTESTOSTERONE CYPIONATE INJECT 0.5 MLS (100 MG TOTAL) INTO THE MUSCLE EVERY 14 (FOURTEEN) DAYS.   tiZANidine 4 MG tablet Commonly known as: ZANAFLEX Take 4 mg by mouth as needed.   topiramate 25 MG tablet Commonly known as: TOPAMAX Take 50 mg by mouth daily.   Trintellix 10 MG Tabs tablet Generic drug: vortioxetine HBr Take 10 mg by mouth daily.   Vitamin D 50 MCG (2000 UT) Caps Take 1 capsule by mouth daily.        Allergies  Allergen Reactions   Neurontin [Gabapentin]     lethargic   Vancomycin     Swelling    Doxycycline     vomiting   Lithium     tremor   Mirtazapine     REACTION:  lethargy ( Remeron)    Consultations: General surgery   Procedures/Studies: CT PELVIS W CONTRAST  Result Date: 02/06/2022 CLINICAL DATA:  Soft tissue infection suspected, pelvis, xray done EXAM: CT PELVIS WITH CONTRAST TECHNIQUE: Multidetector CT imaging of the pelvis was performed using the standard protocol following the bolus administration of intravenous contrast. RADIATION DOSE REDUCTION: This exam was performed according to the departmental dose-optimization program which includes automated exposure control, adjustment of the mA and/or kV according to patient size and/or use of iterative reconstruction technique. CONTRAST:  OMNIPAQUE IOHEXOL 300 MG/ML  SOLN COMPARISON:  CT 02/03/2022 FINDINGS: Urinary Tract:  No abnormality visualized. Bowel:  Unremarkable visualized pelvic bowel loops. Vascular/Lymphatic: There are few prominent inguinal lymph nodes, likely reactive. No significant vascular findings. Reproductive:  Unremarkable Other: Interval improvement in skin thickening and soft tissue  swelling along the left gluteal cleft. There is no focal fluid collection. No soft tissue gas. Musculoskeletal: No acute osseous abnormality. No suspicious osseous lesion. IMPRESSION: Interval improvement in left gluteal fold skin thickening and soft tissue swelling. No soft tissue abscess. Electronically Signed   By: Caprice RenshawJacob  Kahn M.D.   On: 02/06/2022 11:34   CT PELVIS W CONTRAST  Result Date: 02/03/2022 CLINICAL DATA:  Anal/rectal abscess EXAM: CT PELVIS WITH CONTRAST TECHNIQUE: Multidetector CT imaging of the pelvis was performed using the standard protocol following the bolus administration of intravenous contrast. RADIATION DOSE REDUCTION: This exam was performed according to the departmental dose-optimization program which includes automated exposure control, adjustment of the mA and/or kV according to patient size and/or use of iterative reconstruction technique. CONTRAST:  100mL OMNIPAQUE IOHEXOL  300 MG/ML  SOLN COMPARISON:  None Available. FINDINGS: Urinary Tract:  The bladder is moderate distended. Bowel:  Unremarkable visualized pelvic bowel loops. Vascular/Lymphatic: No pathologically enlarged lymph nodes. No significant vascular abnormality seen. Reproductive:  Unremarkable Other: There is skin thickening and subcutaneous soft tissue swelling along the left gluteal cleft from near the anus to the medial upper left thigh. There is no well-defined fluid collection. No ischiorectal fossa involvement. No soft tissue gas Musculoskeletal: No acute osseous abnormality. No suspicious osseous lesion. IMPRESSION: Skin thickening and subcutaneous soft tissue swelling along the left gluteal cleft from near the anus to the medial upper left thigh, as can be seen in cellulitis. No soft tissue gas. No well-defined/drainable fluid collection. Electronically Signed   By: Caprice RenshawJacob  Kahn M.D.   On: 02/03/2022 14:17   (Echo, Carotid, EGD, Colonoscopy, ERCP)    Subjective: Patient seen and examined.  Has some headache but denies any other complaints.  He does complain of some pain still present on the penile area however redness and erythema has improved.  Is very excited to go home.  Pain is managed with home regimen of oral pain medications.   Discharge Exam: Vitals:   02/11/22 0350 02/11/22 0838  BP: 138/89 (!) 152/120  Pulse: 64 63  Resp: 20 18  Temp: 98.2 F (36.8 C) 98 F (36.7 C)  SpO2: 98% 97%   Vitals:   02/10/22 0857 02/10/22 2033 02/11/22 0350 02/11/22 0838  BP: 118/66 (!) 146/99 138/89 (!) 152/120  Pulse: (!) 53 75 64 63  Resp: 18 20 20 18   Temp:  98.1 F (36.7 C) 98.2 F (36.8 C) 98 F (36.7 C)  TempSrc:   Oral Oral  SpO2: 97% 96% 98% 97%  Weight:      Height:        General: Pt is alert, awake, not in acute distress Cardiovascular: RRR, S1/S2 +, no rubs, no gallops Respiratory: CTA bilaterally, no wheezing, no rhonchi Abdominal: Soft, NT, ND, bowel sounds + Extremities: no  edema, no cyanosis No evidence of redness, erythema or rashes.    The results of significant diagnostics from this hospitalization (including imaging, microbiology, ancillary and laboratory) are listed below for reference.     Microbiology: Recent Results (from the past 240 hour(s))  SARS Coronavirus 2 by RT PCR (hospital order, performed in [facility] lab) *cepheid single result test* Anterior Nasal Swab     Status: None   Collection Time: 02/07/22  3:25 PM   Specimen: Anterior Nasal Swab  Result Value Ref Range Status   SARS Coronavirus 2 by RT PCR NEGATIVE NEGATIVE Final    Comment: (NOTE) SARS-CoV-2 target nucleic acids are NOT DETECTED.  The SARS-CoV-2  RNA is generally detectable in upper and lower respiratory specimens during the acute phase of infection. The lowest concentration of SARS-CoV-2 viral copies this assay can detect is 250 copies / mL. A negative result does not preclude SARS-CoV-2 infection and should not be used as the sole basis for treatment or other patient management decisions.  A negative result may occur with improper specimen collection / handling, submission of specimen other than nasopharyngeal swab, presence of viral mutation(s) within the areas targeted by this assay, and inadequate number of viral copies (<250 copies / mL). A negative result must be combined with clinical observations, patient history, and epidemiological information.  Fact Sheet for Patients:   RoadLapTop.co.za  Fact Sheet for Healthcare Providers: http://kim-miller.com/  This test is not yet approved or  cleared by the [place] FDA and has been authorized for detection and/or diagnosis of SARS-CoV-2 by FDA under an Emergency Use Authorization (EUA).  This EUA will remain in effect (meaning this test can be used) for the duration of the COVID-19 declaration under Section 564(b)(1) of the Act, 21 U.S.C. section  360bbb-3(b)(1), unless the authorization is terminated or revoked sooner.  Performed at [facility], [address] [place], [place] [address]      Labs: BNP (last 3 results) No results for input(s): "BNP" in the last 8760 hours. Basic Metabolic Panel: Recent Labs  Lab 02/06/22 0947 02/08/22 0739 02/09/22 0756 02/10/22 0803 02/11/22 0412  NA 142 141 140 141 140  K 4.2 4.0 4.7 4.6 3.9  CL 112* 107 108 108 111  CO2 25 29 30 29 25   GLUCOSE 108* 71 117* 128* 130*  BUN 11 13 16 13 12   CREATININE 1.31* 1.48* 1.57* 1.59* 1.24  CALCIUM 8.5* 8.6* 8.5* 8.2* 8.5*   Liver Function Tests: No results for input(s): "AST", "ALT", "ALKPHOS", "BILITOT", "PROT", "ALBUMIN" in the last 168 hours. No results for input(s): "LIPASE", "AMYLASE" in the last 168 hours. No results for input(s): "AMMONIA" in the last 168 hours. CBC: Recent Labs  Lab 02/06/22 0947 02/08/22 0739 02/09/22 0756 02/10/22 0803 02/11/22 0412  WBC 7.2 8.0 7.8 8.0 9.4  NEUTROABS  --  3.1  --  3.2 4.5  HGB 14.9 15.6 15.8 14.4 14.7  HCT 46.7 48.8 49.2 45.2 45.5  MCV 89.8 89.7 88.3 89.9 89.0  PLT 196 215 205 183 196   Cardiac Enzymes: No results for input(s): "CKTOTAL", "CKMB", "CKMBINDEX", "TROPONINI" in the last 168 hours. BNP: Invalid input(s): "POCBNP" CBG: Recent Labs  Lab 02/11/22 0837  GLUCAP 88   D-Dimer No results for input(s): "DDIMER" in the last 72 hours. Hgb A1c No results for input(s): "HGBA1C" in the last 72 hours. Lipid Profile No results for input(s): "CHOL", "HDL", "LDLCALC", "TRIG", "CHOLHDL", "LDLDIRECT" in the last 72 hours. Thyroid function studies No results for input(s): "TSH", "T4TOTAL", "T3FREE", "THYROIDAB" in the last 72 hours.  Invalid input(s): "FREET3" Anemia work up No results for input(s): "VITAMINB12", "FOLATE", "FERRITIN", "TIBC", "IRON", "RETICCTPCT" in the last 72 hours. Urinalysis    Component Value Date/Time   COLORURINE STRAW (A) 05/16/2019 1551    APPEARANCEUR Clear 09/12/2021 1025   LABSPEC 1.010 05/16/2019 1551   LABSPEC 1.009 03/23/2014 1924   PHURINE 7.0 05/16/2019 1551   GLUCOSEU Negative 09/12/2021 1025   GLUCOSEU Negative 03/23/2014 1924   HGBUR NEGATIVE 05/16/2019 1551   BILIRUBINUR Negative 09/12/2021 1025   BILIRUBINUR Negative 03/23/2014 1924   KETONESUR NEGATIVE 05/16/2019 1551   PROTEINUR Negative 09/12/2021 1025   PROTEINUR  NEGATIVE 05/16/2019 1551   UROBILINOGEN 0.2 11/21/2013 1549   NITRITE Negative 09/12/2021 1025   NITRITE NEGATIVE 05/16/2019 1551   LEUKOCYTESUR Negative 09/12/2021 1025   LEUKOCYTESUR NEGATIVE 05/16/2019 1551   LEUKOCYTESUR Negative 03/23/2014 1924   Sepsis Labs Recent Labs  Lab 02/08/22 0739 02/09/22 0756 02/10/22 0803 02/11/22 0412  WBC 8.0 7.8 8.0 9.4   Microbiology Recent Results (from the past 240 hour(s))  SARS Coronavirus 2 by RT PCR (hospital order, performed in [facility] hospital lab) *cepheid single result test* Anterior Nasal Swab     Status: None   Collection Time: 02/07/22  3:25 PM   Specimen: Anterior Nasal Swab  Result Value Ref Range Status   SARS Coronavirus 2 by RT PCR NEGATIVE NEGATIVE Final    Comment: (NOTE) SARS-CoV-2 target nucleic acids are NOT DETECTED.  The SARS-CoV-2 RNA is generally detectable in upper and lower respiratory specimens during the acute phase of infection. The lowest concentration of SARS-CoV-2 viral copies this assay can detect is 250 copies / mL. A negative result does not preclude SARS-CoV-2 infection and should not be used as the sole basis for treatment or other patient management decisions.  A negative result may occur with improper specimen collection / handling, submission of specimen other than nasopharyngeal swab, presence of viral mutation(s) within the areas targeted by this assay, and inadequate number of viral copies (<250 copies / mL). A negative result must be combined with clinical observations, patient history, and  epidemiological information.  Fact Sheet for Patients:   RoadLapTop.co.za  Fact Sheet for Healthcare Providers: http://kim-miller.com/  This test is not yet approved or  cleared by the [place] FDA and has been authorized for detection and/or diagnosis of SARS-CoV-2 by FDA under an Emergency Use Authorization (EUA).  This EUA will remain in effect (meaning this test can be used) for the duration of the COVID-19 declaration under Section 564(b)(1) of the Act, 21 U.S.C. section 360bbb-3(b)(1), unless the authorization is terminated or revoked sooner.  Performed at [facility], [address] [place], [place] [address]      Time coordinating discharge:  35 minutes  SIGNED:   Dorcas Carrow, MD  [facility] 02/11/2022, 9:17 AM

## 2022-02-11 NOTE — Plan of Care (Signed)
  Problem: Clinical Measurements: Goal: Ability to avoid or minimize complications of infection will improve Outcome: Progressing   Problem: Skin Integrity: Goal: Skin integrity will improve Outcome: Progressing   Problem: Education: Goal: Knowledge of General Education information will improve Description: Including pain rating scale, medication(s)/side effects and non-pharmacologic comfort measures Outcome: Progressing   Problem: Health Behavior/Discharge Planning: Goal: Ability to manage health-related needs will improve Outcome: Progressing   Problem: Clinical Measurements: Goal: Ability to maintain clinical measurements within normal limits will improve Outcome: Progressing Goal: Will remain free from infection Outcome: Progressing Goal: Diagnostic test results will improve Outcome: Progressing Goal: Respiratory complications will improve Outcome: Progressing Goal: Cardiovascular complication will be avoided Outcome: Progressing   Problem: Activity: Goal: Risk for activity intolerance will decrease Outcome: Progressing   Problem: Nutrition: Goal: Adequate nutrition will be maintained Outcome: Progressing   Problem: Coping: Goal: Level of anxiety will decrease Outcome: Not Progressing Note: Patient continues to be anxious about condition with bleeding when having stools, though educated that due to his recent procedure some blood is expected with stools. Nurse continues to educated and reassure patient    Problem: Elimination: Goal: Will not experience complications related to bowel motility Outcome: Progressing Goal: Will not experience complications related to urinary retention Outcome: Progressing   Problem: Pain Managment: Goal: General experience of comfort will improve Outcome: Progressing   Problem: Safety: Goal: Ability to remain free from injury will improve Outcome: Progressing   Problem: Skin Integrity: Goal: Risk for impaired skin integrity  will decrease Outcome: Progressing   Problem: Education: Goal: Knowledge of General Education information will improve Description: Including pain rating scale, medication(s)/side effects and non-pharmacologic comfort measures Outcome: Progressing   Problem: Health Behavior/Discharge Planning: Goal: Ability to manage health-related needs will improve Outcome: Progressing   Problem: Clinical Measurements: Goal: Ability to maintain clinical measurements within normal limits will improve Outcome: Progressing Goal: Will remain free from infection Outcome: Progressing Goal: Diagnostic test results will improve Outcome: Progressing Goal: Respiratory complications will improve Outcome: Progressing Goal: Cardiovascular complication will be avoided Outcome: Progressing   Problem: Activity: Goal: Risk for activity intolerance will decrease Outcome: Progressing   Problem: Nutrition: Goal: Adequate nutrition will be maintained Outcome: Progressing   Problem: Coping: Goal: Level of anxiety will decrease Outcome: Progressing   Problem: Elimination: Goal: Will not experience complications related to bowel motility Outcome: Progressing Goal: Will not experience complications related to urinary retention Outcome: Progressing   Problem: Pain Managment: Goal: General experience of comfort will improve Outcome: Progressing   Problem: Safety: Goal: Ability to remain free from injury will improve Outcome: Progressing   Problem: Skin Integrity: Goal: Risk for impaired skin integrity will decrease Outcome: Progressing

## 2022-02-12 ENCOUNTER — Encounter: Payer: Medicare Other | Admitting: Physician Assistant

## 2022-02-12 ENCOUNTER — Other Ambulatory Visit: Payer: Medicare Other

## 2022-02-13 ENCOUNTER — Ambulatory Visit: Payer: BC Managed Care – PPO | Admitting: Urology

## 2022-02-19 ENCOUNTER — Encounter: Payer: Self-pay | Admitting: Physician Assistant

## 2022-02-19 ENCOUNTER — Ambulatory Visit (INDEPENDENT_AMBULATORY_CARE_PROVIDER_SITE_OTHER): Payer: Medicare Other | Admitting: Physician Assistant

## 2022-02-19 ENCOUNTER — Other Ambulatory Visit: Payer: Self-pay

## 2022-02-19 VITALS — BP 113/78 | HR 82 | Temp 98.4°F | Wt 193.0 lb

## 2022-02-19 DIAGNOSIS — K645 Perianal venous thrombosis: Secondary | ICD-10-CM

## 2022-02-19 DIAGNOSIS — K641 Second degree hemorrhoids: Secondary | ICD-10-CM

## 2022-02-19 DIAGNOSIS — E291 Testicular hypofunction: Secondary | ICD-10-CM

## 2022-02-19 DIAGNOSIS — Z09 Encounter for follow-up examination after completed treatment for conditions other than malignant neoplasm: Secondary | ICD-10-CM

## 2022-02-19 NOTE — Progress Notes (Signed)
[facility] POST-OP OFFICE VISIT  02/19/2022  HPI: Cody [name] is a 50 y.o. male 22 days s/p EUA and two column hemorrhoidectomy with Dr Claudine Mouton. Unfortunately, it seems his post-operative course was complicated by some degree of cellulitis vs allergic reaction which required admission from 06/27 - 07/05 for IV Abx.    Since discharge he noted a few days of fatigue but his energy is coming back Pain is almost all gone Erythema and rash is significantly improved and nearly resolved Still some itching; using benadryl No fever, chills, or rectal bleeding No other complaints   Vital signs: BP 113/78   Pulse 82   Temp 98.4 F (36.9 C) (Oral)   Wt 193 lb (87.5 kg)   SpO2 93%   BMI 32.12 kg/m    Physical Exam: Constitutional: Well appearing male, NAD GU: Chaperone present, external rectal examination is benign, previously seen erythema is now resolved  Assessment/Plan: This is a 51 y.o. male 22 days s/p EUA and two column hemorrhoidectomy   - Nothing further from surgerical perspective   - Pain control prn  - Bowel regimen as needed  - He can follow up on as needed basis; He understands to call with questions/concerns  -- [name], PA-C [facility] 02/19/2022, 3:40 PM M-F: 7am - 4pm

## 2022-02-19 NOTE — Patient Instructions (Signed)
If you have any concerns or questions, please feel free to call our office. Follow up as needed.   Surgical Procedures for Hemorrhoids, Care After This sheet gives you information about how to care for yourself after your procedure. Your health care provider may also give you more specific instructions. If you have problems or questions, contact your health care provider. What can I expect after the procedure? After the procedure, it is common to have: Rectal pain. Pain when you are having a bowel movement. Slight rectal bleeding. This is more likely to happen with the first bowel movement after surgery. Follow these instructions at home: Medicines Take over-the-counter and prescription medicines only as told by your health care provider. If you were prescribed an antibiotic medicine, use it as told by your health care provider. Do not stop using the antibiotic even if your condition improves. Ask your health care provider if the medicine prescribed to you requires you to avoid driving or using heavy machinery. Use a stool softener or a bulk laxative as told by your health care provider. Eating and drinking Follow instructions from your health care provider about what to eat or drink after your procedure. You may need to take actions to prevent or treat constipation, such as: Drink enough fluid to keep your urine pale yellow. Take over-the-counter or prescription medicines. Eat foods that are high in fiber, such as beans, whole grains, and fresh fruits and vegetables. Limit foods that are high in fat and processed sugars, such as fried or sweet foods. Activity  Rest as told by your health care provider. Avoid sitting for a long time without moving. Get up to take short walks every 1-2 hours. This is important to improve blood flow and breathing. Ask for help if you feel weak or unsteady. Return to your normal activities as told by your health care provider. Ask your health care provider  what activities are safe for you. Do not lift anything that is heavier than 10 lb (4.5 kg), or the limit that you are told, until your health care provider says that it is safe. Do not strain to have a bowel movement. Do not spend a long time sitting on the toilet. General instructions  Take warm sitz baths for 15-20 minutes, 2-3 times a day to relieve soreness or itching and to keep the rectal area clean. Apply ice packs to the area to reduce swelling and pain. Do not drive for 24 hours if you were given a sedative during your procedure. Keep all follow-up visits as told by your health care provider. This is important. Contact a health care provider if: Your pain medicine is not helping. You have a fever or chills. You have bad smelling drainage. You have a lot of swelling. You become constipated. You have trouble passing urine. Get help right away if: You have very bad rectal pain. You have heavy bleeding from your rectum. Summary After the procedure, it is common to have pain and slight rectal bleeding. Take warm sitz baths for 15-20 minutes, 2-3 times a day to relieve soreness or itching and to keep the rectal area clean. Avoid straining when having a bowel movement. Eat foods that are high in fiber, such as beans, whole grains, and fresh fruits and vegetables. Take over-the-counter and prescription medicines only as told by your health care provider. This information is not intended to replace advice given to you by your health care provider. Make sure you discuss any questions you have with your   health care provider. Document Revised: 02/05/2021 Document Reviewed: 02/05/2021 Elsevier Patient Education  2023 Elsevier Inc.  

## 2022-02-20 ENCOUNTER — Telehealth: Payer: Self-pay | Admitting: Surgery

## 2022-02-20 ENCOUNTER — Telehealth: Payer: Self-pay

## 2022-02-20 ENCOUNTER — Other Ambulatory Visit: Payer: Medicare Other

## 2022-02-20 NOTE — Telephone Encounter (Signed)
[facility] Night - Client TELEPHONE ADVICE RECORD [facility] Patient Name: Cody [name] [place] Gender: Male DOB: [birth year] Age: 51 Y 1 M 22 D Return Phone Number: [phone] (Primary), [phone] (Secondary) Address: City/ State/ Zip: [place] [address] Client [facility] Night - Client Client Site [facility] - Night Provider Raechel Ache - MD Contact Type Call Who Is Calling Patient / Member / Family / Caregiver Call Type Triage / Clinical Relationship To Patient Self Return Phone Number [phone] (Secondary) Chief Complaint Itching Reason for Call Symptomatic / Request for Health Information Initial Comment Caller states his private area is itching really bad after his hemorrhoidectomy. Translation No Nurse Assessment Nurse: Lesly Rubenstein, RN, Crystal Date/Time (Eastern Time): 02/19/2022 5:09:49 PM Confirm and document reason for call. If symptomatic, describe symptoms. ---Caller states his private area is itching really bad after his hemorrhoidectomy. States he had a staph infection after surgery. States he needs a strong antiitch cream to apply to rectal area, inside and out. No signs of infection, no bleeding. Saw the surgeon today and states he referred him to pcp about itching cream. Does the patient have any new or worsening symptoms? ---Yes Will a triage be completed? ---Yes Related visit to physician within the last 2 weeks? ---Yes Does the PT have any chronic conditions? (i.e. diabetes, asthma, this includes High risk factors for pregnancy, etc.) ---Yes List chronic conditions. ---Chronic Pain syndrome Is this a behavioral health or substance abuse call? ---No Guidelines Guideline Title Affirmed Question Affirmed Notes Nurse Date/Time Lamount Cohen Time) Rectal Symptoms MODERATESEVERE rectal itching (i.e., interferes with school, work, or sleep) Parrott, RN, Crystal 02/19/2022  5:12:16 PM PLEASE NOTE: All timestamps contained within this report are represented as [place] Standard Time. CONFIDENTIALTY NOTICE: This fax transmission is intended only for the addressee. It contains information that is legally privileged, confidential or otherwise protected from use or disclosure. If you are not the intended recipient, you are strictly prohibited from reviewing, disclosing, copying using or disseminating any of this information or taking any action in reliance on or regarding this information. If you have received this fax in error, please notify us immediately by telephone so that we can arrange for its return to [place]. Phone: [phone], Toll-Free: [phone], Fax: [phone] Page: 2 of 2 Call Id: [id number] Disp. Time Lamount Cohen Time) Disposition Final User 02/19/2022 5:20:50 PM See PCP within 24 Hours Yes Lesly Rubenstein, RN, Crystal Final Disposition 02/19/2022 5:20:50 PM See PCP within 24 Hours Yes Parrott, RN, Buyer, retail Understands Yes PreDisposition Call Doctor Care Advice Given Per Guideline SEE PCP WITHIN 24 HOURS: * IF OFFICE WILL BE OPEN: You need to be examined within the next 24 hours. Call your doctor (or NP/PA) when the office opens and make an appointment. CARE ADVICE given per Rectal Symptoms (Adult) guideline. CALL BACK IF: * You become worse HYDROCORTISONE OINTMENT FOR RECTAL ITCHING: WARM SALINE SITZ BATH - HOW TO MAKE A SITZ BATH: Comments User: Floyce Stakes, RN Date/Time (Eastern Time): 02/19/2022 5:20:35 PM Caller states he has been using sitz baths, hemorrhoid pads. Debucaine has been used as well. States he had surgery on 21st, surgeon cleared him today. States the Dr told him the itching would not be 'fun' for a while. States he reached out to surgeons office but they are closed as well. I, this nurse, explained that due to his recent surgery, the surgeon would normally prescribe this. If he needed it from PCP  they  would have to see him to do exam. Caller just wanted something called in. Thought this would be quicker. Referrals GO TO FACILITY REFUSE

## 2022-02-20 NOTE — Telephone Encounter (Signed)
I spoke with pt and pt saw surgical PA on 02/19/22 and pt is going to call surgeon office now about benadryl not helping rectal itching and see if can get cream from surgeon. Pt said if cannot get cream from surgeon pt will call back to DR Para March since last saw him on 02/03/22. Advised to call surgeon's office now. Pt voiced understanding. Sending note to Dr Para March as Lorain Childes.

## 2022-02-20 NOTE — Telephone Encounter (Signed)
Incoming call from patient.  He had hemorroidectomy done on 01/28/22 with Dr. Sandi Carne everything doing ok except for the extreme itching he is having around the anal area.  Is using OTC creams, taking Benadryl for the itching and that is not working. He is requesting for a topical ointment to be called in for the itching to his pharmacy.  Please call him back with status.  Thank you.

## 2022-02-20 NOTE — Telephone Encounter (Signed)
Noted. Thanks.

## 2022-02-23 ENCOUNTER — Other Ambulatory Visit: Payer: Medicare Other

## 2022-02-23 DIAGNOSIS — E291 Testicular hypofunction: Secondary | ICD-10-CM | POA: Diagnosis not present

## 2022-02-23 NOTE — Progress Notes (Unsigned)
02/24/2022 9:29 AM   Cody [name] 05-09-1971 [id number]  Referring provider: Joaquim Nam, MD [address] [place],  [place] [address]  Urological history: 1. Testosterone deficiency -contributing factors of age, obesity and chronic opioid use -testosterone, 02/2022 - 155 -HCT and hemoglobin, 02/2022 - 47.2%/15.2 -managed with testosterone cypionate 200 mg/cc, 0.5 cc every 14 days   2. BPH with LU TS -PSA, 02/2022 - 1.4  -Predominantly obstructive urinary symptoms -I PSS 2/2 -Tadalafil 5 mg daily  3. ED -contributing factors of age, testosterone deficiency, BPH, chronic pain medications, smoking, HLD, HTN, depression, anxiety and hypothyroidism -SHIM 21  Chief Complaint  Patient presents with   Erectile Dysfunction   Benign Prostatic Hypertrophy     HPI: Cody [name] is a 51 y.o. male who presents today to for follow up.   No urinary complaints.  Patient denies any modifying or aggravating factors.  Patient denies any gross hematuria, dysuria or suprapubic/flank pain.  Patient denies any fevers, chills, nausea or vomiting.    IPSS     Row Name 02/24/22 0900         International Prostate Symptom Score   How often have you had the sensation of not emptying your bladder? Not at All     How often have you had to urinate less than every two hours? Less than 1 in 5 times     How often have you found you stopped and started again several times when you urinated? Not at All     How often have you found it difficult to postpone urination? Not at All     How often have you had a weak urinary stream? Not at All     How often have you had to strain to start urination? Not at All     How many times did you typically get up at night to urinate? 1 Time     Total IPSS Score 2       Quality of Life due to urinary symptoms   If you were to spend the rest of your life with your urinary condition just the way it is now how would you feel about that? Mostly Satisfied                 Score:  1-7 Mild 8-19 Moderate 20-35 Severe  He states that since being on 0.5 cc of testosterone cypionate, he has no erections, no libidos and no spontaneous erections.  He has no curvature or pain with erections.   SHIM     Row Name 02/24/22 0913         SHIM: Over the last 6 months:   How do you rate your confidence that you could get and keep an erection? Moderate     When you had erections with sexual stimulation, how often were your erections hard enough for penetration (entering your partner)? Most Times (much more than half the time)     During sexual intercourse, how often were you able to maintain your erection after you had penetrated (entered) your partner? Almost Always or Always     During sexual intercourse, how difficult was it to maintain your erection to completion of intercourse? Not Difficult     When you attempted sexual intercourse, how often was it satisfactory for you? 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; herniated disc L5- S1   Degenerative disk disease    Depression    Fracture, mandible (HCC)    Fracture, tibia    Headache    Hyperlipidemia    Hypertension    resolved   Hypothyroidism 05/2007   Dr Marga Melnick (retired)/ Dr Crawford Givens   Neuromuscular disorder [facility])    PTSD (post-traumatic stress disorder)    Suicidal behavior    a.) ideations with attempt x 1    Surgical History: Past Surgical History:  Procedure Laterality  Date   APPENDECTOMY     peritonitis   BACK SURGERY     EVALUATION UNDER ANESTHESIA WITH HEMORRHOIDECTOMY N/A 01/28/2022   Procedure: EXAM UNDER ANESTHESIA WITH HEMORRHOIDECTOMY;  Surgeon: Campbell Lerner, MD;  Location: [facility] ORS;  Service: General;  Laterality: N/A;   FRACTURE SURGERY     INTRATHECAL PUMP IMPLANTATION  2007   LAMINECTOMY  2004   arterial injury, transfused 7 units pc, implant surgery    MANDIBLE FRACTURE SURGERY     mugged   THORACIC DISCECTOMY     T10   UPPER GASTROINTESTINAL ENDOSCOPY  2006   gastritis    Home Medications:  Allergies as of 02/24/2022       Reactions   Neurontin [gabapentin]    lethargic   Vancomycin    Swelling    Doxycycline    vomiting   Lithium    tremor   Mirtazapine    REACTION: lethargy ( Remeron)        Medication List        Accurate as of February 24, 2022  9:29 AM. If you have any questions, ask your nurse or doctor.          ALPRAZolam 0.5 MG tablet Commonly known as: XANAX Take 1 mg by mouth 2 (two) times daily as needed for anxiety or sleep.   AMBULATORY NON FORMULARY MEDICATION 8.608 mg by Intrathecal Infusion route daily. Morphine Pump Implant   atorvastatin 10 MG tablet Commonly known as: LIPITOR Take 1 tablet (10 mg total) by mouth daily. NEEDS TO SCHEDULE PHYSICAL   B-D 3CC LUER-LOK SYR 21GX1-1/2 21G X 1-1/2" 3 ML Misc Generic drug: SYRINGE-NEEDLE (DISP) 3 ML Use this needle to injection   BD SafetyGlide Needle 18G X 1-1/2" Misc Generic drug: NEEDLE (DISP) 18 G Draw up   buPROPion 150 MG 24 hr tablet Commonly known as: WELLBUTRIN XL Take 150 mg by mouth every morning.   busPIRone 10 MG tablet Commonly known as: BUSPAR Take 1 tablet (10 mg total) by mouth 2 (two) times daily.   Caplyta 42 MG capsule Generic drug: lumateperone tosylate Take 42 mg by mouth daily.   Emgality 120 MG/ML Soaj Generic drug: Galcanezumab-gnlm Inject 120 mg into the skin every 30 (thirty) days.   escitalopram 20 MG  tablet Commonly known as: Lexapro Take 1 tablet (20 mg total) by mouth daily.   HYDROmorphone 2 MG tablet Commonly known as: DILAUDID Take 2  mg by mouth 4 (four) times daily as needed.   hydrOXYzine 50 MG tablet Commonly known as: ATARAX Take 50 mg by mouth as needed. BID   ibuprofen 800 MG tablet Commonly known as: ADVIL Take 1 tablet (800 mg total) by mouth every 8 (eight) hours as needed.   lamoTRIgine 200 MG tablet Commonly known as: LAMICTAL Take 1 tablet (200 mg total) by mouth 2 (two) times daily.   levothyroxine 75 MCG tablet Commonly known as: SYNTHROID TAKE 1 TABLET BY MOUTH DAILY  BEFORE BREAKFAST   Nucynta 100 MG Tabs Generic drug: Tapentadol HCl Take 1 tablet by mouth every 6 (six) hours as needed.   Omega-3 1000 MG Caps Take 2 each by mouth daily.   OVER THE COUNTER MEDICATION Take 1 Piece by mouth daily. CBD gummy (contains small amount of THC)   polyethylene glycol powder 17 GM/SCOOP powder Commonly known as: GLYCOLAX/MIRALAX Take 17 g by mouth every other day as needed.   promethazine 25 MG tablet Commonly known as: PHENERGAN TAKE 1/2 TO 1 TABLET BY MOUTH EVERY 8 HOURS AS NEEDED FOR NAUSEA AND VOMITING   SUMAtriptan 100 MG tablet Commonly known as: IMITREX Take 1 tablet (100 mg total) by mouth every 2 (two) hours as needed for migraine (max 2 doses in 24 hours.). May repeat in 2 hours if headache persists or recurs.   tadalafil 5 MG tablet Commonly known as: CIALIS Take 1 tablet (5 mg total) by mouth daily as needed.   testosterone cypionate 200 MG/ML injection Commonly known as: DEPOTESTOSTERONE CYPIONATE Inject 0.75 cc every 14 days What changed:  how much to take how to take this when to take this additional instructions   tiZANidine 4 MG tablet Commonly known as: ZANAFLEX Take 4 mg by mouth as needed.   topiramate 25 MG tablet Commonly known as: TOPAMAX Take 50 mg by mouth daily.   Trintellix 10 MG Tabs tablet Generic drug:  vortioxetine HBr Take 10 mg by mouth daily.   Vitamin D 50 MCG (2000 UT) Caps Take 1 capsule by mouth daily.        Allergies:  Allergies  Allergen Reactions   Neurontin [Gabapentin]     lethargic   Vancomycin     Swelling    Doxycycline     vomiting   Lithium     tremor   Mirtazapine     REACTION: lethargy ( Remeron)    Family History: Family History  Problem Relation Age of Onset   Ulcers Mother    Depression Mother    Thyroid disease Mother        hypo   Stroke Father 75   Thyroid disease Father        hypo   Lung cancer Maternal Grandmother    Thyroid disease Maternal Grandmother        hypo   Colon cancer Maternal Grandfather    Heart attack Maternal Grandfather 55   Esophageal cancer Maternal Grandfather    Diabetes Neg Hx    Prostate cancer Neg Hx    Kidney cancer Neg Hx    Bladder Cancer Neg Hx    Migraines Neg Hx    Headache Neg Hx     Social History:  reports that he quit smoking about 12 years ago. His smoking use included e-cigarettes and cigarettes. He smoked an average of .25 packs per day. He has never been exposed to tobacco smoke. He has never used smokeless tobacco. He reports that he does not  drink alcohol and does not use drugs.  ROS: Pertinent ROS in HPI  Physical Exam: BP 101/72   Pulse 72   Ht 5\' 5"  (1.651 m)   Wt 196 lb (88.9 kg)   BMI 32.62 kg/m   Constitutional:  Well nourished. Alert and oriented, No acute distress. HEENT: [place] AT, moist mucus membranes.  Trachea midline Cardiovascular: No clubbing, cyanosis, or edema. Respiratory: Normal respiratory effort, no increased work of breathing. Neurologic: Grossly intact, no focal deficits, moving all 4 extremities. Psychiatric: Normal mood and affect.  Patient deferred GU exam secondary to recent hemorrhoidectomy  Laboratory Data:    Latest Ref Rng & Units 02/11/2022    4:12 AM 02/10/2022    8:03 AM 02/09/2022    7:56 AM  BMP  Glucose 70 - 99 mg/dL 04/12/2022  295  621   BUN 6 - 20  mg/dL 12  13  16    Creatinine 0.61 - 1.24 mg/dL 308   6.57   Sodium 135 - 145 mmol/L 140  141  140   Potassium 3.5 - 5.1 mmol/L 3.9  4.6  4.7   Chloride 98 - 111 mmol/L 111  108  108   CO2 22 - 32 mmol/L 25  29  30    Calcium 8.9 - 10.3 mg/dL 8.5  8.2  8.5        Latest Ref Rng & Units 02/23/2022    2:34 PM 02/11/2022    4:12 AM 02/10/2022    8:03 AM  CBC  WBC 4.0 - 10.5 K/uL  9.4  8.0   Hemoglobin 13.0 - 17.7 g/dL 02/25/2022  04/14/2022  04/13/2022   Hematocrit 37.5 - 51.0 % 47.2  45.5  45.2   Platelets 150 - 400 K/uL  196  183   I have reviewed the labs.   Pertinent Imaging: No recent imaging  Assessment & Plan:    1. Testosterone deficiency  -testosterone levels are subtherapeutic -H & H WNL -increase to 0.75 cc every 14 days of testosterone cypionate  2. BPH with LUTS -PSA stable -continue conservative management, avoiding bladder irritants and timed voiding's -Continue Cialis 5 mg daily  3. Erectile dysfunction:    - will reassess once testosterone levels are therapeutic     Return in about 3 months (around 05/27/2022) for testosterone (one week after injection) H & H.  These notes generated with voice recognition software. I apologize for typographical errors.  84.1, PA-C  [facility] [address]  [address] [place], [facility] [address] [phone]

## 2022-02-24 ENCOUNTER — Encounter: Payer: Self-pay | Admitting: Urology

## 2022-02-24 ENCOUNTER — Ambulatory Visit (INDEPENDENT_AMBULATORY_CARE_PROVIDER_SITE_OTHER): Payer: BC Managed Care – PPO | Admitting: Urology

## 2022-02-24 ENCOUNTER — Other Ambulatory Visit: Payer: Self-pay | Admitting: Physician Assistant

## 2022-02-24 VITALS — BP 101/72 | HR 72 | Ht 65.0 in | Wt 196.0 lb

## 2022-02-24 DIAGNOSIS — E291 Testicular hypofunction: Secondary | ICD-10-CM

## 2022-02-24 DIAGNOSIS — N529 Male erectile dysfunction, unspecified: Secondary | ICD-10-CM

## 2022-02-24 DIAGNOSIS — E349 Endocrine disorder, unspecified: Secondary | ICD-10-CM

## 2022-02-24 DIAGNOSIS — N400 Enlarged prostate without lower urinary tract symptoms: Secondary | ICD-10-CM

## 2022-02-24 LAB — TESTOSTERONE: Testosterone: 155 ng/dL — ABNORMAL LOW (ref 264–916)

## 2022-02-24 LAB — PSA: Prostate Specific Ag, Serum: 1.4 ng/mL (ref 0.0–4.0)

## 2022-02-24 LAB — HEMOGLOBIN AND HEMATOCRIT, BLOOD
Hematocrit: 47.2 % (ref 37.5–51.0)
Hemoglobin: 15.2 g/dL (ref 13.0–17.7)

## 2022-02-24 MED ORDER — HYDROXYZINE HCL 50 MG PO TABS: 50.0000 mg | ORAL_TABLET | Freq: Four times a day (QID) | ORAL | 0 refills | Status: DC | PRN

## 2022-02-24 MED ORDER — TESTOSTERONE CYPIONATE 200 MG/ML IM SOLN: INTRAMUSCULAR | 0 refills | Status: DC

## 2022-02-25 ENCOUNTER — Other Ambulatory Visit: Payer: Self-pay

## 2022-02-25 DIAGNOSIS — E291 Testicular hypofunction: Secondary | ICD-10-CM

## 2022-02-25 MED ORDER — HYDROXYZINE HCL 50 MG PO TABS: 50.0000 mg | ORAL_TABLET | Freq: Two times a day (BID) | ORAL | 0 refills | Status: DC

## 2022-03-01 ENCOUNTER — Other Ambulatory Visit: Payer: Self-pay | Admitting: Family Medicine

## 2022-03-01 DIAGNOSIS — E038 Other specified hypothyroidism: Secondary | ICD-10-CM

## 2022-03-01 DIAGNOSIS — E785 Hyperlipidemia, unspecified: Secondary | ICD-10-CM

## 2022-03-02 ENCOUNTER — Other Ambulatory Visit: Payer: BC Managed Care – PPO

## 2022-03-02 ENCOUNTER — Ambulatory Visit: Payer: BC Managed Care – PPO

## 2022-03-03 ENCOUNTER — Other Ambulatory Visit (INDEPENDENT_AMBULATORY_CARE_PROVIDER_SITE_OTHER): Payer: BC Managed Care – PPO

## 2022-03-03 ENCOUNTER — Ambulatory Visit (INDEPENDENT_AMBULATORY_CARE_PROVIDER_SITE_OTHER): Payer: BC Managed Care – PPO

## 2022-03-03 VITALS — Wt 196.0 lb

## 2022-03-03 DIAGNOSIS — Z0000 Encounter for general adult medical examination without abnormal findings: Secondary | ICD-10-CM | POA: Diagnosis not present

## 2022-03-03 DIAGNOSIS — E785 Hyperlipidemia, unspecified: Secondary | ICD-10-CM

## 2022-03-03 DIAGNOSIS — E038 Other specified hypothyroidism: Secondary | ICD-10-CM | POA: Diagnosis not present

## 2022-03-03 NOTE — Patient Instructions (Signed)
Cody [name] , Thank you for taking time to come for your Medicare Wellness Visit. I appreciate your ongoing commitment to your health goals. Please review the following plan we discussed and let me know if I can assist you in the future.   Screening recommendations/referrals: Colonoscopy: 02/19/12 Recommended yearly ophthalmology/optometry visit for glaucoma screening and checkup Recommended yearly dental visit for hygiene and checkup  Vaccinations: Influenza vaccine: 05/18/21 Pneumococcal vaccine: n/d Tdap vaccine: n/d Shingles vaccine: n/d   Covid-19: 11/08/19, 11/28/19, 07/10/20, + 2 other boosters (need dates)  Advanced directives: no  Conditions/risks identified: none  Next appointment: Follow up in one year for your annual wellness visit 03/05/23 @ 10:30 am by phone  Preventive Care 40-64 Years, Male Preventive care refers to lifestyle choices and visits with your health care provider that can promote health and wellness. What does preventive care include? A yearly physical exam. This is also called an annual well check. Dental exams once or twice a year. Routine eye exams. Ask your health care provider how often you should have your eyes checked. Personal lifestyle choices, including: Daily care of your teeth and gums. Regular physical activity. Eating a healthy diet. Avoiding tobacco and drug use. Limiting alcohol use. Practicing safe sex. Taking low-dose aspirin every day starting at age 61. What happens during an annual well check? The services and screenings done by your health care provider during your annual well check will depend on your age, overall health, lifestyle risk factors, and family history of disease. Counseling  Your health care provider may ask you questions about your: Alcohol use. Tobacco use. Drug use. Emotional well-being. Home and relationship well-being. Sexual activity. Eating habits. Work and work Astron[employer]. Screening  You may have the  following tests or measurements: Height, weight, and BMI. Blood pressure. Lipid and cholesterol levels. These may be checked every 5 years, or more frequently if you are over 50 years old. Skin check. Lung cancer screening. You may have this screening every year starting at age 50 if you have a 30-pack-year history of smoking and currently smoke or have quit within the past 15 years. Fecal occult blood test (FOBT) of the stool. You may have this test every year starting at age 82. Flexible sigmoidoscopy or colonoscopy. You may have a sigmoidoscopy every 5 years or a colonoscopy every 10 years starting at age 3. Prostate cancer screening. Recommendations will vary depending on your family history and other risks. Hepatitis C blood test. Hepatitis B blood test. Sexually transmitted disease (STD) testing. Diabetes screening. This is done by checking your blood sugar (glucose) after you have not eaten for a while (fasting). You may have this done every 1-3 years. Discuss your test results, treatment options, and if necessary, the need for more tests with your health care provider. Vaccines  Your health care provider may recommend certain vaccines, such as: Influenza vaccine. This is recommended every year. Tetanus, diphtheria, and acellular pertussis (Tdap, Td) vaccine. You may need a Td booster every 10 years. Zoster vaccine. You may need this after age 19. Pneumococcal 13-valent conjugate (PCV13) vaccine. You may need this if you have certain conditions and have not been vaccinated. Pneumococcal polysaccharide (PPSV23) vaccine. You may need one or two doses if you smoke cigarettes or if you have certain conditions. Talk to your health care provider about which screenings and vaccines you need and how often you need them. This information is not intended to replace advice given to you by your health care provider. Make  sure you discuss any questions you have with your health care  provider. Document Released: 08/23/2015 Document Revised: 04/15/2016 Document Reviewed: 05/28/2015 Elsevier Interactive Patient Education  2017 [employer].  Fall Prevention in the Home Falls can cause injuries. They can happen to people of all ages. There are many things you can do to make your home safe and to help prevent falls. What can I do on the outside of my home? Regularly fix the edges of walkways and driveways and fix any cracks. Remove anything that might make you trip as you walk through a door, such as a raised step or threshold. Trim any bushes or trees on the path to your home. Use bright outdoor lighting. Clear any walking paths of anything that might make someone trip, such as rocks or tools. Regularly check to see if handrails are loose or broken. Make sure that both sides of any steps have handrails. Any raised decks and porches should have guardrails on the edges. Have any leaves, snow, or ice cleared regularly. Use sand or salt on walking paths during winter. Clean up any spills in your garage right away. This includes oil or grease spills. What can I do in the bathroom? Use night lights. Install grab bars by the toilet and in the tub and shower. Do not use towel bars as grab bars. Use non-skid mats or decals in the tub or shower. If you need to sit down in the shower, use a plastic, non-slip stool. Keep the floor dry. Clean up any water that spills on the floor as soon as it happens. Remove soap buildup in the tub or shower regularly. Attach bath mats securely with double-sided non-slip rug tape. Do not have throw rugs and other things on the floor that can make you trip. What can I do in the bedroom? Use night lights. Make sure that you have a light by your bed that is easy to reach. Do not use any sheets or blankets that are too big for your bed. They should not hang down onto the floor. Have a firm chair that has side arms. You can use this for support while  you get dressed. Do not have throw rugs and other things on the floor that can make you trip. What can I do in the kitchen? Clean up any spills right away. Avoid walking on wet floors. Keep items that you use a lot in easy-to-reach places. If you need to reach something above you, use a strong step stool that has a grab bar. Keep electrical cords out of the way. Do not use floor polish or wax that makes floors slippery. If you must use wax, use non-skid floor wax. Do not have throw rugs and other things on the floor that can make you trip. What can I do with my stairs? Do not leave any items on the stairs. Make sure that there are handrails on both sides of the stairs and use them. Fix handrails that are broken or loose. Make sure that handrails are as long as the stairways. Check any carpeting to make sure that it is firmly attached to the stairs. Fix any carpet that is loose or worn. Avoid having throw rugs at the top or bottom of the stairs. If you do have throw rugs, attach them to the floor with carpet tape. Make sure that you have a light switch at the top of the stairs and the bottom of the stairs. If you do not have them, ask  someone to add them for you. What else can I do to help prevent falls? Wear shoes that: Do not have high heels. Have rubber bottoms. Are comfortable and fit you well. Are closed at the toe. Do not wear sandals. If you use a stepladder: Make sure that it is fully opened. Do not climb a closed stepladder. Make sure that both sides of the stepladder are locked into place. Ask someone to hold it for you, if possible. Clearly mark and make sure that you can see: Any grab bars or handrails. First and last steps. Where the edge of each step is. Use tools that help you move around (mobility aids) if they are needed. These include: Canes. Walkers. Scooters. Crutches. Turn on the lights when you go into a dark area. Replace any light bulbs as soon as they burn  out. Set up your furniture so you have a clear path. Avoid moving your furniture around. If any of your floors are uneven, fix them. If there are any pets around you, be aware of where they are. Review your medicines with your doctor. Some medicines can make you feel dizzy. This can increase your chance of falling. Ask your doctor what other things that you can do to help prevent falls. This information is not intended to replace advice given to you by your health care provider. Make sure you discuss any questions you have with your health care provider. Document Released: 05/23/2009 Document Revised: 01/02/2016 Document Reviewed: 08/31/2014 Elsevier Interactive Patient Education  2017 [employer].

## 2022-03-03 NOTE — Progress Notes (Signed)
Virtual Visit via Telephone Note  I connected with  Cody [name] on 03/03/22 at 11:45 AM EDT by telephone and verified that I am speaking with the correct person using two identifiers.  Location: Patient: home Provider: [facility] Persons participating in the virtual visit: patient/Nurse Health Advisor   I discussed the limitations, risks, security and privacy concerns of performing an evaluation and management service by telephone and the availability of in person appointments. The patient expressed understanding and agreed to proceed.  Interactive audio and video telecommunications were attempted between this nurse and patient, however failed, due to patient having technical difficulties OR patient did not have access to video capability.  We continued and completed visit with audio only.  Some vital signs may be absent or patient reported.   Hal HopeLorrie S [name] Staub, LPN  Subjective:   Cody [name] is a 51 y.o. male who presents for Medicare Annual/Subsequent preventive examination.  Review of Systems     Cardiac Risk Factors include: advanced age (>7055men, 31>65 women);male gender     Objective:    There were no vitals filed for this visit. There is no height or weight on file to calculate BMI.     03/03/2022   11:57 AM 02/03/2022    5:48 PM 02/03/2022   12:30 PM 01/28/2022   11:35 AM 01/22/2022    9:36 AM 05/16/2019    2:44 PM 05/11/2018   10:05 PM  Advanced Directives  Does Patient Have a Medical Advance Directive? No No No No No No No  Would patient like information on creating a medical advance directive? No - Patient declined No - Patient declined No - Patient declined No - Patient declined   No - Patient declined    Current Medications (verified) Outpatient Encounter Medications as of 03/03/2022  Medication Sig   ALPRAZolam (XANAX) 0.5 MG tablet Take 1 mg by mouth 2 (two) times daily as needed for anxiety or sleep.   ALPRAZolam (XANAX) 1 MG tablet SMARTSIG:0.5 Tablet(s)  By Mouth 1-2 Times Daily   AMBULATORY NON FORMULARY MEDICATION 8.608 mg by Intrathecal Infusion route daily. Morphine Pump Implant   atorvastatin (LIPITOR) 10 MG tablet Take 1 tablet (10 mg total) by mouth daily. NEEDS TO SCHEDULE PHYSICAL   buPROPion (WELLBUTRIN XL) 150 MG 24 hr tablet Take 150 mg by mouth every morning.   busPIRone (BUSPAR) 10 MG tablet Take 1 tablet (10 mg total) by mouth 2 (two) times daily.   CAPLYTA 42 MG capsule Take 42 mg by mouth daily.   Cholecalciferol (VITAMIN D) 50 MCG (2000 UT) CAPS Take 1 capsule by mouth daily.   escitalopram (LEXAPRO) 20 MG tablet Take 1 tablet (20 mg total) by mouth daily.   Galcanezumab-gnlm (EMGALITY) 120 MG/ML SOAJ Inject 120 mg into the skin every 30 (thirty) days.   hydrOXYzine (ATARAX) 50 MG tablet Take 1 tablet (50 mg total) by mouth in the morning and at bedtime. BID   levothyroxine (SYNTHROID) 75 MCG tablet TAKE 1 TABLET BY MOUTH DAILY  BEFORE BREAKFAST   NEEDLE, DISP, 18 G (BD SAFETYGLIDE NEEDLE) 18G X 1-1/2" MISC Draw up   NUCYNTA 100 MG TABS Take 1 tablet by mouth every 6 (six) hours as needed.   Omega-3 1000 MG CAPS Take 2 each by mouth daily.   OVER THE COUNTER MEDICATION Take 1 Piece by mouth daily. CBD gummy (contains small amount of THC)   polyethylene glycol powder (GLYCOLAX/MIRALAX) 17 GM/SCOOP powder Take 17 g by mouth every other  day as needed.   promethazine (PHENERGAN) 25 MG tablet TAKE 1/2 TO 1 TABLET BY MOUTH EVERY 8 HOURS AS NEEDED FOR NAUSEA AND VOMITING   SUMAtriptan (IMITREX) 100 MG tablet Take 1 tablet (100 mg total) by mouth every 2 (two) hours as needed for migraine (max 2 doses in 24 hours.). May repeat in 2 hours if headache persists or recurs.   SYRINGE-NEEDLE, DISP, 3 ML (B-D 3CC LUER-LOK SYR 21GX1-1/2) 21G X 1-1/2" 3 ML MISC Use this needle to injection   tadalafil (CIALIS) 5 MG tablet Take 1 tablet (5 mg total) by mouth daily as needed.   testosterone cypionate (DEPOTESTOSTERONE CYPIONATE) 200 MG/ML  injection Inject 0.75 cc every 14 days   tiZANidine (ZANAFLEX) 4 MG tablet Take 4 mg by mouth as needed.   topiramate (TOPAMAX) 25 MG tablet Take 50 mg by mouth daily.   topiramate (TOPAMAX) 50 MG tablet Take 50 mg by mouth daily.   vortioxetine HBr (TRINTELLIX) 10 MG TABS tablet Take 10 mg by mouth daily.   [DISCONTINUED] Tapentadol HCl 100 MG TABS Take by mouth.   HYDROmorphone (DILAUDID) 2 MG tablet Take 2 mg by mouth 4 (four) times daily as needed. (Patient not taking: Reported on 02/24/2022)   ibuprofen (ADVIL) 800 MG tablet Take 1 tablet (800 mg total) by mouth every 8 (eight) hours as needed. (Patient not taking: Reported on 03/03/2022)   lamoTRIgine (LAMICTAL) 100 MG tablet Take 100 mg by mouth 2 (two) times daily. (Patient not taking: Reported on 03/03/2022)   lamoTRIgine (LAMICTAL) 200 MG tablet Take 1 tablet (200 mg total) by mouth 2 (two) times daily. (Patient not taking: Reported on 03/03/2022)   No facility-administered encounter medications on file as of 03/03/2022.    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; herniated disc L5- S1   Degenerative disk disease    Depression    Fracture, mandible (HCC)    Fracture, tibia    Headache    Hyperlipidemia    Hypertension    resolved   Hypothyroidism 05/2007   Dr Marga Melnick (retired)/ Dr Crawford Givens   Neuromuscular disorder [facility])    PTSD (post-traumatic stress disorder)    Suicidal behavior    a.) ideations with attempt x 1   Past Surgical History:  Procedure Laterality Date   APPENDECTOMY     peritonitis   BACK SURGERY      EVALUATION UNDER ANESTHESIA WITH HEMORRHOIDECTOMY N/A 01/28/2022   Procedure: EXAM UNDER ANESTHESIA WITH HEMORRHOIDECTOMY;  Surgeon: Campbell Lerner, MD;  Location: [facility] ORS;  Service: General;  Laterality: N/A;   FRACTURE SURGERY     INTRATHECAL PUMP IMPLANTATION  2007   LAMINECTOMY  2004   arterial injury, transfused 7 units pc, implant surgery    MANDIBLE FRACTURE SURGERY     mugged   THORACIC DISCECTOMY     T10   UPPER GASTROINTESTINAL ENDOSCOPY  2006   gastritis   Family History  Problem Relation Age of Onset   Ulcers Mother    Depression Mother    Thyroid  disease Mother        hypo   Stroke Father 39   Thyroid disease Father        hypo   Lung cancer Maternal Grandmother    Thyroid disease Maternal Grandmother        hypo   Colon cancer Maternal Grandfather    Heart attack Maternal Grandfather 5   Esophageal cancer Maternal Grandfather    Diabetes Neg Hx    Prostate cancer Neg Hx    Kidney cancer Neg Hx    Bladder Cancer Neg Hx    Migraines Neg Hx    Headache Neg Hx    Social History   Socioeconomic History   Marital status: Married    Spouse name: Research scientist (physical sciences)   Number of children: 0   Years of education: Not on file   Highest education level: Not on file  Occupational History   Occupation: Disabled  Tobacco Use   Smoking status: Former    Packs/day: 0.25    Types: E-cigarettes, Cigarettes    Quit date: 08/10/2009    Years since quitting: 12.5    Passive exposure: Never   Smokeless tobacco: Never   Tobacco comments:    smoked age intermittently age [age]; up to 2 cigarettes/ day   Vaping Use   Vaping Use: Every day   Substances: Nicotine, Flavoring  Substance and Sexual Activity   Alcohol use: No   Drug use: No   Sexual activity: Not on file  Other Topics Concern   Not on file  Social History Narrative   From [place].     [address] Grad   To [place] 2000   Disability from bipolar affective disorder, prev accounting.     Married 1997   No  kids   Enjoys walking, travel.     Social Determinants of Health   Financial Resource Strain: Low Risk  (03/03/2022)   Overall Financial Resource Strain (CARDIA)    Difficulty of Paying Living Expenses: Not hard at all  Food Insecurity: No Food Insecurity (03/03/2022)   Hunger Vital Sign    Worried About Running Out of Food in the Last Year: Never true    Ran Out of Food in the Last Year: Never true  Transportation Needs: No Transportation Needs (03/03/2022)   PRAPARE - Administrator, Civil Service (Medical): No    Lack of Transportation (Non-Medical): No  Physical Activity: Insufficiently Active (03/03/2022)   Exercise Vital Sign    Days of Exercise per Week: 2 days    Minutes of Exercise per Session: 20 min  Stress: No Stress Concern Present (03/03/2022)   [employer] of Occupational Health - Occupational Stress Questionnaire    Feeling of Stress : Only a little  Social Connections: Socially Isolated (03/03/2022)   Social Connection and Isolation Panel [NHANES]    Frequency of Communication with Friends and Family: Once a week    Frequency of Social Gatherings with Friends and Family: Once a week    Attends Religious Services: Never    Database administrator or Organizations: No    Attends Engineer, structural: Never    Marital Status: Married    Tobacco Counseling Counseling given: Not Answered Tobacco comments: smoked age intermittently age [age]-36;[age]; up to 2 cigarettes/ day    Clinical Intake:  Pre-visit preparation completed: Yes  Pain : No/denies pain     Nutritional Risks: None Diabetes: No  How often do you need to have someone help you  when you read instructions, pamphlets, or other written materials from your doctor or pharmacy?: 1 - Never  Diabetic?no     Information entered by :: Kennedy Bucker, LPN   Activities of Daily Living    03/03/2022   11:58 AM 02/03/2022    5:51 PM  In your present state of health, do you  have any difficulty performing the following activities:  Hearing? 0 0  Vision? 0 0  Difficulty concentrating or making decisions? 0 0  Walking or climbing stairs? 0 0  Dressing or bathing? 0 0  Doing errands, shopping? 0 0  Preparing Food and eating ? N   Using the Toilet? N   In the past six months, have you accidently leaked urine? N   Do you have problems with loss of bowel control? N   Managing your Medications? N   Managing your Finances? N   Housekeeping or managing your Housekeeping? N     Patient Care Team: Joaquim Nam, MD as PCP - General (Family Medicine) Darliss Ridgel, MD as Referring Physician (Psychiatry) Salena Saner, MD as Referring Physician (Pain Medicine)  Indicate any recent Medical Services you may have received from other than [facility] providers in the past year (date may be approximate).     Assessment:   This is a routine wellness examination for Cody [name].  Hearing/Vision screen Hearing Screening - Comments:: No aids Vision Screening - Comments:: Wears glasses-   Dietary issues and exercise activities discussed: Current Exercise Habits: Home exercise routine, Type of exercise: walking, Time (Minutes): 20, Frequency (Times/Week): 2, Weekly Exercise (Minutes/Week): 40, Intensity: Mild   Goals Addressed             This Visit's Progress    DIET - EAT MORE FRUITS AND VEGETABLES         Depression Screen    03/03/2022   11:54 AM 06/13/2020   12:37 PM 02/28/2018   12:30 PM 03/09/2017   12:46 PM  PHQ 2/9 Scores  PHQ - 2 Score 0 0 6 6  PHQ- 9 Score 1  14     Fall Risk    03/03/2022   11:57 AM 06/13/2020   12:37 PM 02/28/2018   12:30 PM  Fall Risk   Falls in the past year? 1 0 No  Number falls in past yr: 0 0   Injury with Fall? 0    Risk for fall due to : History of fall(s)    Follow up Falls prevention discussed Falls evaluation completed     FALL RISK PREVENTION PERTAINING TO THE HOME:  Any stairs in or around the home? Yes  If  so, are there any without handrails? No  Home free of loose throw rugs in walkways, pet beds, electrical cords, etc? Yes  Adequate lighting in your home to reduce risk of falls? Yes   ASSISTIVE DEVICES UTILIZED TO PREVENT FALLS:  Life alert? No  Use of a cane, walker or w/c? No  Grab bars in the bathroom? No  Shower chair or bench in shower? No  Elevated toilet seat or a handicapped toilet? No   Cognitive Function:declined    02/28/2018   12:15 PM  MMSE - Mini Mental State Exam  Orientation to time 5  Orientation to Place 5  Registration 3  Attention/ Calculation 0  Recall 3  Language- name 2 objects 0  Language- repeat 1  Language- follow 3 step command 3  Language- read & follow direction 0  Write  a sentence 0  Copy design 0  Total score 20        03/03/2022   12:05 PM  6CIT Screen  What Year? 0 points  What month? 0 points  What time? 0 points  Count back from 20 0 points  Months in reverse 0 points  Repeat phrase 0 points  Total Score 0 points    Immunizations Immunization History  Administered Date(s) Administered   Influenza Whole 05/01/2010   Influenza,inj,Quad PF,6+ Mos 04/25/2018, 06/24/2019, 05/18/2021   Influenza-Unspecified 05/14/2015, 05/24/2020   PFIZER(Purple Top)SARS-COV-2 Vaccination 11/08/2019, 11/28/2019, 07/10/2020    TDAP status: Due, Education has been provided regarding the importance of this vaccine. Advised may receive this vaccine at local pharmacy or Health Dept. Aware to provide a copy of the vaccination record if obtained from local pharmacy or Health Dept. Verbalized acceptance and understanding.  Flu Vaccine status: Up to date  Pneumococcal vaccine status: Declined,  Education has been provided regarding the importance of this vaccine but patient still declined. Advised may receive this vaccine at local pharmacy or Health Dept. Aware to provide a copy of the vaccination record if obtained from local pharmacy or Health Dept.  Verbalized acceptance and understanding.   Covid-19 vaccine status: Completed vaccines  Qualifies for Shingles Vaccine? Yes   Zostavax completed No   Shingrix Completed?: No.    Education has been provided regarding the importance of this vaccine. Patient has been advised to call insurance company to determine out of pocket expense if they have not yet received this vaccine. Advised may also receive vaccine at local pharmacy or Health Dept. Verbalized acceptance and understanding.  Screening Tests Health Maintenance  Topic Date Due   TETANUS/TDAP  Never done   Zoster Vaccines- Shingrix (1 of 2) Never done   COVID-19 Vaccine (4 - Booster for Pfizer series) 09/04/2020   COLONOSCOPY (Pts 45-16yrs Insurance coverage will need to be confirmed)  02/18/2022   INFLUENZA VACCINE  03/10/2022   Hepatitis C Screening  Completed   HIV Screening  Completed   HPV VACCINES  Aged Out    Health Maintenance  Health Maintenance Due  Topic Date Due   TETANUS/TDAP  Never done   Zoster Vaccines- Shingrix (1 of 2) Never done   COVID-19 Vaccine (4 - Booster for Pfizer series) 09/04/2020   COLONOSCOPY (Pts 45-62yrs Insurance coverage will need to be confirmed)  02/18/2022    Colorectal cancer screening: Type of screening: Colonoscopy. Completed 02/19/12. Repeat every 10 years  Lung Cancer Screening: (Low Dose CT Chest recommended if Age 1-80 years, 30 pack-year currently smoking OR have quit w/in 15years.) does not qualify.   Additional Screening:  Hepatitis C Screening: does qualify; Completed 07/23/21  Vision Screening: Recommended annual ophthalmology exams for early detection of glaucoma and other disorders of the eye. Is the patient up to date with their annual eye exam?  Yes  Who is the provider or what is the name of the office in which the patient attends annual eye exams? Doesn't know MD name If pt is not established with a provider, would they like to be referred to a provider to establish  care? No .   Dental Screening: Recommended annual dental exams for proper oral hygiene  Community Resource Referral / Chronic Care Management: CRR required this visit?  No   CCM required this visit?  No      Plan:     I have personally reviewed and noted the following in the patient's chart:  Medical and social history Use of alcohol, tobacco or illicit drugs  Current medications and supplements including opioid prescriptions. Patient is currently taking opioid prescriptions. Information provided to patient regarding non-opioid alternatives. Patient advised to discuss non-opioid treatment plan with their provider. Functional ability and status Nutritional status Physical activity Advanced directives List of other physicians Hospitalizations, surgeries, and ER visits in previous 12 months Vitals Screenings to include cognitive, depression, and falls Referrals and appointments  In addition, I have reviewed and discussed with patient certain preventive protocols, quality metrics, and best practice recommendations. A written personalized care plan for preventive services as well as general preventive health recommendations were provided to patient.     Hal Hope, LPN   04/25/3845   Nurse Notes: none

## 2022-03-03 NOTE — Addendum Note (Signed)
Addended by: Alvina Chou on: 03/03/2022 08:23 AM   Modules accepted: Orders

## 2022-03-04 DIAGNOSIS — F4312 Post-traumatic stress disorder, chronic: Secondary | ICD-10-CM | POA: Diagnosis not present

## 2022-03-04 DIAGNOSIS — F603 Borderline personality disorder: Secondary | ICD-10-CM | POA: Diagnosis not present

## 2022-03-04 DIAGNOSIS — F410 Panic disorder [episodic paroxysmal anxiety] without agoraphobia: Secondary | ICD-10-CM | POA: Diagnosis not present

## 2022-03-04 DIAGNOSIS — F411 Generalized anxiety disorder: Secondary | ICD-10-CM | POA: Diagnosis not present

## 2022-03-04 LAB — COMPREHENSIVE METABOLIC PANEL
ALT: 21 IU/L (ref 0–44)
AST: 22 IU/L (ref 0–40)
Albumin/Globulin Ratio: 2 (ref 1.2–2.2)
Albumin: 4.8 g/dL (ref 4.1–5.1)
Alkaline Phosphatase: 94 IU/L (ref 44–121)
BUN/Creatinine Ratio: 9 (ref 9–20)
BUN: 14 mg/dL (ref 6–24)
Bilirubin Total: 0.3 mg/dL (ref 0.0–1.2)
CO2: 25 mmol/L (ref 20–29)
Calcium: 8.7 mg/dL (ref 8.7–10.2)
Chloride: 104 mmol/L (ref 96–106)
Creatinine, Ser: 1.63 mg/dL — ABNORMAL HIGH (ref 0.76–1.27)
Globulin, Total: 2.4 g/dL (ref 1.5–4.5)
Glucose: 86 mg/dL (ref 70–99)
Potassium: 4.2 mmol/L (ref 3.5–5.2)
Sodium: 143 mmol/L (ref 134–144)
Total Protein: 7.2 g/dL (ref 6.0–8.5)
eGFR: 51 mL/min/{1.73_m2} — ABNORMAL LOW (ref >59)

## 2022-03-04 LAB — CBC WITH DIFFERENTIAL/PLATELET
Basophils Absolute: 0.1 x10E3/uL (ref 0.0–0.2)
Basos: 1 %
EOS (ABSOLUTE): 0.4 x10E3/uL (ref 0.0–0.4)
Eos: 5 %
Hematocrit: 49.8 % (ref 37.5–51.0)
Hemoglobin: 16.4 g/dL (ref 13.0–17.7)
Immature Grans (Abs): 0 x10E3/uL (ref 0.0–0.1)
Immature Granulocytes: 0 %
Lymphocytes Absolute: 2.9 x10E3/uL (ref 0.7–3.1)
Lymphs: 35 %
MCH: 29.2 pg (ref 26.6–33.0)
MCHC: 32.9 g/dL (ref 31.5–35.7)
MCV: 89 fL (ref 79–97)
Monocytes Absolute: 0.6 x10E3/uL (ref 0.1–0.9)
Monocytes: 7 %
Neutrophils Absolute: 4.3 x10E3/uL (ref 1.4–7.0)
Neutrophils: 52 %
Platelets: 188 x10E3/uL (ref 150–450)
RBC: 5.62 x10E6/uL (ref 4.14–5.80)
RDW: 14.7 % (ref 11.6–15.4)
WBC: 8.2 x10E3/uL (ref 3.4–10.8)

## 2022-03-04 LAB — LIPID PANEL
Chol/HDL Ratio: 5.9 ratio — ABNORMAL HIGH (ref 0.0–5.0)
Cholesterol, Total: 153 mg/dL (ref 100–199)
HDL: 26 mg/dL — ABNORMAL LOW (ref >39)
LDL Chol Calc (NIH): 98 mg/dL (ref 0–99)
Triglycerides: 165 mg/dL — ABNORMAL HIGH (ref 0–149)
VLDL Cholesterol Cal: 29 mg/dL (ref 5–40)

## 2022-03-04 LAB — TSH: TSH: 7.24 u[IU]/mL — ABNORMAL HIGH (ref 0.450–4.500)

## 2022-03-06 ENCOUNTER — Encounter: Payer: BC Managed Care – PPO | Admitting: Family Medicine

## 2022-03-09 ENCOUNTER — Encounter: Payer: Self-pay | Admitting: Family Medicine

## 2022-03-09 ENCOUNTER — Ambulatory Visit (INDEPENDENT_AMBULATORY_CARE_PROVIDER_SITE_OTHER): Payer: BC Managed Care – PPO | Admitting: Family Medicine

## 2022-03-09 VITALS — BP 110/76 | HR 95 | Temp 97.8°F | Ht 65.0 in | Wt 195.0 lb

## 2022-03-09 DIAGNOSIS — Z0000 Encounter for general adult medical examination without abnormal findings: Secondary | ICD-10-CM

## 2022-03-09 DIAGNOSIS — E038 Other specified hypothyroidism: Secondary | ICD-10-CM | POA: Diagnosis not present

## 2022-03-09 DIAGNOSIS — R7989 Other specified abnormal findings of blood chemistry: Secondary | ICD-10-CM

## 2022-03-09 DIAGNOSIS — G894 Chronic pain syndrome: Secondary | ICD-10-CM

## 2022-03-09 DIAGNOSIS — Z1211 Encounter for screening for malignant neoplasm of colon: Secondary | ICD-10-CM

## 2022-03-09 DIAGNOSIS — Z7189 Other specified counseling: Secondary | ICD-10-CM

## 2022-03-09 DIAGNOSIS — F3170 Bipolar disorder, currently in remission, most recent episode unspecified: Secondary | ICD-10-CM

## 2022-03-09 DIAGNOSIS — E785 Hyperlipidemia, unspecified: Secondary | ICD-10-CM

## 2022-03-09 MED ORDER — LEVOTHYROXINE SODIUM 75 MCG PO TABS
75.0000 ug | ORAL_TABLET | Freq: Every day | ORAL | Status: DC
Start: 2022-03-09 — End: 2022-07-13

## 2022-03-09 NOTE — Patient Instructions (Addendum)
Check with your insurance to see if they will cover the shingles and tetanus shots.  Reasonable to get done after addressing your kidney function.   Recheck labs when well hydrated- urine and blood.  Schedule on the way out.   Avoid ibuprofen and aleve.   Take care.  Glad to see you.

## 2022-03-09 NOTE — Progress Notes (Unsigned)
S/p inpatient tx for cellulitis.  Didn't need surgery but had AKI in the midst of abx treatment.  Recheck Cr 1.6 and d/w pt.  No nsaids usually.    Chronic pain per outside clinic.  Nucynta helped.    Mood/rx per outside clinic.  Mood improved with pain control and getting out of the hospital.  He has psych f/u.  Trintellix helped.    Elevated Cholesterol: Using medications without problems: yes Muscle aches: likely not from statin.  Diet compliance: d/w pt.   Exercise: limited by pain.   Testosterone replacement per outside clinic.    Hypothyroidism.  TSH elevated.  Weight gain noted.  Noted hair changes.    Tetanus d/w pt.   Flu to be done yearly Covid prev done  PNA not due.  Shingles d/w pt.   Colonoscopy can be done later this fall. Referral placed 2023.   PSA wn 2023.   Advance directive-wife designated patient were incapacitated.  Meds, vitals, and allergies reviewed.   ROS: Per HPI unless specifically indicated in ROS section   GEN: nad, alert and oriented HEENT: ncat NECK: supple w/o LA CV: rrr. PULM: ctab, no inc wob ABD: soft, +bs EXT: no edema SKIN: no acute rash  30 minutes were devoted to patient care in this encounter (this includes time spent reviewing the patient's file/history, interviewing and examining the patient, counseling/reviewing plan with patient).

## 2022-03-11 DIAGNOSIS — Z0000 Encounter for general adult medical examination without abnormal findings: Secondary | ICD-10-CM | POA: Insufficient documentation

## 2022-03-11 DIAGNOSIS — R7989 Other specified abnormal findings of blood chemistry: Secondary | ICD-10-CM | POA: Insufficient documentation

## 2022-03-11 NOTE — Assessment & Plan Note (Signed)
Continue atorvastatin.  Routine cautions given to patient.  Exercise limited by pain.

## 2022-03-11 NOTE — Assessment & Plan Note (Signed)
Per outside clinic.  I will defer.  He agrees. 

## 2022-03-11 NOTE — Assessment & Plan Note (Signed)
Discussed increasing levothyroxine and recheck TSH in about 2 months.

## 2022-03-11 NOTE — Assessment & Plan Note (Signed)
Tetanus d/w pt.   Flu to be done yearly Covid prev done  PNA not due.  Shingles d/w pt.   Colonoscopy can be done later this fall. Referral placed 2023.   PSA wn 2023.   Advance directive-wife designated patient were incapacitated.

## 2022-03-11 NOTE — Assessment & Plan Note (Signed)
Didn't need surgery but had AKI in the midst of abx treatment.  Recheck Cr 1.6 and d/w pt.  No nsaids usually.  Advised to avoid NSAIDs and we can recheck labs when well-hydrated.

## 2022-03-11 NOTE — Assessment & Plan Note (Signed)
Advance directive-wife designated patient were incapacitated. 

## 2022-03-13 ENCOUNTER — Encounter: Payer: Self-pay | Admitting: Family Medicine

## 2022-03-16 ENCOUNTER — Telehealth: Payer: Self-pay | Admitting: Family Medicine

## 2022-03-16 ENCOUNTER — Telehealth: Payer: Self-pay | Admitting: Neurology

## 2022-03-16 NOTE — Telephone Encounter (Signed)
PA completed on CMM/optumRX [id number] The PA indicated has the patient tried and failed ajovy or aimovig and pt hs not tried aimovig. I will await determination from insurance.

## 2022-03-16 NOTE — Telephone Encounter (Signed)
Did patient say what pharmacy. There are several  listed.

## 2022-03-16 NOTE — Telephone Encounter (Signed)
Pt called in requesting to have RX SUMAtriptan (IMITREX) 100 MG tablet . Change from pill to injection because of his headache and vomiting. Please Advise [phone]

## 2022-03-17 ENCOUNTER — Other Ambulatory Visit: Payer: Self-pay | Admitting: Family Medicine

## 2022-03-17 ENCOUNTER — Other Ambulatory Visit: Payer: Self-pay

## 2022-03-17 DIAGNOSIS — E291 Testicular hypofunction: Secondary | ICD-10-CM

## 2022-03-17 MED ORDER — SUMATRIPTAN SUCCINATE 100 MG PO TABS: 100.0000 mg | ORAL_TABLET | ORAL | 4 refills | Status: DC | PRN

## 2022-03-17 MED ORDER — HYDROXYZINE HCL 50 MG PO TABS: 50.0000 mg | ORAL_TABLET | Freq: Two times a day (BID) | ORAL | 0 refills | Status: DC

## 2022-03-17 NOTE — Telephone Encounter (Signed)
Patient called in to follow up on this request. He stated that he uses this pharmacy [phone] [place], [place] - [phone] and stated if they are out of stock it can be [facility] to [id number] [place] [address], [place] - [phone]

## 2022-03-17 NOTE — Telephone Encounter (Signed)
Erx [facility] [place]

## 2022-03-18 ENCOUNTER — Telehealth: Payer: Self-pay | Admitting: Family Medicine

## 2022-03-18 MED ORDER — SUMATRIPTAN SUCCINATE 6 MG/0.5ML ~~LOC~~ SOLN: 6.0000 mg | Freq: Every day | SUBCUTANEOUS | 3 refills | Status: DC | PRN

## 2022-03-18 NOTE — Telephone Encounter (Addendum)
Plz notify I've sent this in for him.  Update [place] with effect.  Cc pcp as fyi

## 2022-03-18 NOTE — Addendum Note (Signed)
Addended by: Eustaquio Boyden on: 03/18/2022 06:13 PM   Modules accepted: Orders

## 2022-03-18 NOTE — Telephone Encounter (Signed)
Patient called back in stating that the [facility] of [facility]. He was changing from SUMAtriptan (IMITREX) 100 MG tablet pill to injection because of his headaches and vomiting, and the new prescription that was sent in was for pills. He stated that [id number] [place], [place] - [address]. Patient would like a call when this prescription is sent in. Please advise. Thank you!

## 2022-03-18 NOTE — Telephone Encounter (Signed)
Pt is wanting an injectable of SUMAtriptan (IMITREX) form. He no longer wants the tablet form, his insurance will not cover it and it doesn't work fast enough. His wife spoke with his insurance and they will cover this, the injectable. He knows how to give himself an injection.  [id number], [place] - [address] [place] [address]  Phone:  [phone]  Fax:  [phone]  DEA #:  [id number] Pt's # [phone]

## 2022-03-18 NOTE — Telephone Encounter (Signed)
PLEASE NOTE: All timestamps contained within this report are represented as [place] Standard Time. CONFIDENTIALTY NOTICE: This fax transmission is intended only for the addressee. It contains information that is legally privileged, confidential or otherwise protected from use or disclosure. If you are not the intended recipient, you are strictly prohibited from reviewing, disclosing, copying using or disseminating any of this information or taking any action in reliance on or regarding this information. If you have received this fax in error, please notify us immediately by telephone so that we can arrange for its return to [place]. Phone: [phone], Toll-Free: [phone], Fax: [phone] Page: 1 of 2 Call Id: [id number] [facility] Night - Client TELEPHONE ADVICE RECORD [facility] Patient Name: Cody [name] Gender: Male DOB: [birth year] Age: 51 Y 2 M 18 D Return Phone Number: [phone] (Primary), [phone] (Secondary) Address: City/ State/ Zip: [place] [address] Client [facility] Night - Client Client Site [facility] - Night Provider Raechel Ache - MD Contact Type Call Who Is Calling Patient / Member / Family / Caregiver Call Type Triage / Clinical Relationship To Patient Self Return Phone Number [phone] (Primary) Chief Complaint Prescription Refill or Medication Request (non symptomatic) Reason for Call Medication Question / Request Initial Comment Caller states that he spoke with the office earlier today and an RX was to be sent to the pharm and it was to be an injection and it was pill form. He has pills already and he needs the injection. (Imatrex) Translation No Nurse Assessment Nurse: Luana Shu, RN, Carlee Date/Time (Eastern Time): 03/17/2022 6:02:44 PM Confirm and document reason for call. If symptomatic, describe symptoms. ---Caller states that he spoke with the office  earlier today and an RX was to be sent to the pharm and it was to be an injection and it was pill form. He has pills already and he needs the injection (Imatrex). States he gets nausea from the pills. States he already takes injections with testosterone and knows how to do it. Declines a triage. States he saw his doctor last week for the migraines. Does the patient have any new or worsening symptoms? ---Yes Will a triage be completed? ---Yes Related visit to physician within the last 2 weeks? ---Yes Does the PT have any chronic conditions? (i.e. diabetes, asthma, this includes High risk factors for pregnancy, etc.) ---Yes List chronic conditions. ---Migraines, Chronic pain, PTSD Is this a behavioral health or substance abuse call? ---No Nurse: Luana Shu, RN, Carlee Date/Time (Eastern Time): 03/17/2022 6:05:45 PM Please select the assessment type ---Verbal order / New medication order Additional Documentation ---Imatrex was supposed to be called in injection form, pill form makes him throw up. Had called and talked to office earlier today and they were supposed to send it in but it was sent in pill form. PLEASE NOTE: All timestamps contained within this report are represented as [place] Standard Time. CONFIDENTIALTY NOTICE: This fax transmission is intended only for the addressee. It contains information that is legally privileged, confidential or otherwise protected from use or disclosure. If you are not the intended recipient, you are strictly prohibited from reviewing, disclosing, copying using or disseminating any of this information or taking any action in reliance on or regarding this information. If you have received this fax in error, please notify us immediately by telephone so that we can arrange for its return to [place]. Phone: [phone], Toll-Free: [phone], Fax: [phone] Page: 2 of 2 Call Id: [id number] Nurse Assessment Does  the client directives allow for assistance  with medications after hours? ---No Disp. Time Lamount Cohen Time) Disposition Final User 03/17/2022 6:23:37 PM Clinical Call Yes Luana Shu RN, Carlee Final Disposition 03/17/2022 6:23:37 PM Clinical Call Yes Luana Shu, RN, Carlee Caller Disagree/Comply Comply Caller Understands Yes PreDisposition Call Doctor Comments User: Ella Bodo, RN Date/Time Lamount Cohen Time): 03/17/2022 6:24:22 PM Patient declines triage; will call office back in morning to discuss issue with medication. Will call back if he ends up wanting a triage.

## 2022-03-19 ENCOUNTER — Telehealth: Payer: Self-pay | Admitting: Neurology

## 2022-03-19 MED ORDER — AIMOVIG 140 MG/ML ~~LOC~~ SOAJ: 140.0000 mg | SUBCUTANEOUS | 5 refills | Status: DC

## 2022-03-19 NOTE — Telephone Encounter (Signed)
PA completed through [facility] [id number] Will await determination

## 2022-03-19 NOTE — Addendum Note (Signed)
Addended by: Judi Cong on: 03/19/2022 11:44 AM   Modules accepted: Orders

## 2022-03-19 NOTE — Telephone Encounter (Signed)
PA approved for the patient.  Sending a mychart as well to inform them.  Request Reference Number: [id number]. AIMOVIG INJ 140MG /ML is approved through 09/19/2022.

## 2022-03-19 NOTE — Telephone Encounter (Signed)
Noted. Thanks.

## 2022-03-19 NOTE — Telephone Encounter (Signed)
Patient was notified the rx has been fixed and changed to injections. Patient was able to pick up rx last night

## 2022-03-19 NOTE — Telephone Encounter (Signed)
Patient notified rx was fixed and he did already pick rx up last night.

## 2022-03-19 NOTE — Telephone Encounter (Signed)
Pt has no complaints of constipation and no latex allergy and is ok with changing to Aimovig per insurance protocols.

## 2022-03-19 NOTE — Telephone Encounter (Signed)
PA denied stating pt must try and fail aimovig first. I will ask Amy if she is ok with making this change. The patient has been on emgality since 2022.

## 2022-03-19 NOTE — Telephone Encounter (Signed)
I see the injection and tablet were sent into the pharmacy. We can check with the patient and see if he has the correct medication. This was sent by Dr. Sharen Hones

## 2022-04-01 ENCOUNTER — Other Ambulatory Visit: Payer: Self-pay

## 2022-04-01 DIAGNOSIS — N400 Enlarged prostate without lower urinary tract symptoms: Secondary | ICD-10-CM

## 2022-04-02 MED ORDER — TADALAFIL 5 MG PO TABS: 5.0000 mg | ORAL_TABLET | Freq: Every day | ORAL | 0 refills | Status: DC | PRN

## 2022-04-16 ENCOUNTER — Other Ambulatory Visit: Payer: Self-pay | Admitting: *Deleted

## 2022-04-16 DIAGNOSIS — E349 Endocrine disorder, unspecified: Secondary | ICD-10-CM

## 2022-04-16 NOTE — Telephone Encounter (Signed)
PA approved!  Request Reference Number: [id number]. AIMOVIG INJ 140MG /ML is approved through 09/19/2022. Your patient may now fill this prescription and it will be covered.

## 2022-04-17 ENCOUNTER — Other Ambulatory Visit: Payer: Medicare Other

## 2022-04-20 ENCOUNTER — Other Ambulatory Visit: Payer: Self-pay | Admitting: Family Medicine

## 2022-04-22 ENCOUNTER — Other Ambulatory Visit: Payer: Medicare Other

## 2022-04-22 DIAGNOSIS — Z978 Presence of other specified devices: Secondary | ICD-10-CM | POA: Diagnosis not present

## 2022-04-22 DIAGNOSIS — M545 Low back pain, unspecified: Secondary | ICD-10-CM | POA: Diagnosis not present

## 2022-04-22 DIAGNOSIS — M5417 Radiculopathy, lumbosacral region: Secondary | ICD-10-CM | POA: Diagnosis not present

## 2022-04-22 DIAGNOSIS — Z79899 Other long term (current) drug therapy: Secondary | ICD-10-CM | POA: Diagnosis not present

## 2022-04-22 DIAGNOSIS — Z5181 Encounter for therapeutic drug level monitoring: Secondary | ICD-10-CM | POA: Diagnosis not present

## 2022-04-22 DIAGNOSIS — G894 Chronic pain syndrome: Secondary | ICD-10-CM | POA: Diagnosis not present

## 2022-04-23 ENCOUNTER — Other Ambulatory Visit: Payer: Medicare Other

## 2022-04-27 ENCOUNTER — Encounter: Payer: Self-pay | Admitting: Internal Medicine

## 2022-04-27 ENCOUNTER — Other Ambulatory Visit: Payer: Self-pay | Admitting: Family Medicine

## 2022-04-27 ENCOUNTER — Other Ambulatory Visit: Payer: Self-pay | Admitting: Urology

## 2022-04-27 DIAGNOSIS — N400 Enlarged prostate without lower urinary tract symptoms: Secondary | ICD-10-CM

## 2022-04-29 ENCOUNTER — Other Ambulatory Visit: Payer: Medicare Other

## 2022-04-29 DIAGNOSIS — E349 Endocrine disorder, unspecified: Secondary | ICD-10-CM

## 2022-04-30 LAB — TESTOSTERONE: Testosterone: 989 ng/dL — ABNORMAL HIGH (ref 264–916)

## 2022-04-30 LAB — HEMOGLOBIN AND HEMATOCRIT, BLOOD
Hematocrit: 48.6 % (ref 37.5–51.0)
Hemoglobin: 16.5 g/dL (ref 13.0–17.7)

## 2022-05-01 ENCOUNTER — Telehealth: Payer: Self-pay | Admitting: Urology

## 2022-05-01 ENCOUNTER — Other Ambulatory Visit: Payer: Self-pay

## 2022-05-01 ENCOUNTER — Telehealth: Payer: Self-pay

## 2022-05-01 DIAGNOSIS — F410 Panic disorder [episodic paroxysmal anxiety] without agoraphobia: Secondary | ICD-10-CM | POA: Diagnosis not present

## 2022-05-01 DIAGNOSIS — N400 Enlarged prostate without lower urinary tract symptoms: Secondary | ICD-10-CM

## 2022-05-01 DIAGNOSIS — E349 Endocrine disorder, unspecified: Secondary | ICD-10-CM

## 2022-05-01 DIAGNOSIS — F411 Generalized anxiety disorder: Secondary | ICD-10-CM | POA: Diagnosis not present

## 2022-05-01 DIAGNOSIS — F603 Borderline personality disorder: Secondary | ICD-10-CM | POA: Diagnosis not present

## 2022-05-01 DIAGNOSIS — F4312 Post-traumatic stress disorder, chronic: Secondary | ICD-10-CM | POA: Diagnosis not present

## 2022-05-01 NOTE — Telephone Encounter (Signed)
Returned pt's call. Advised him his dose has not changed since 02/24/22 per [facility] office note. He has been injecting 0.75cc q 14 days, advised him this is correct. He expressed understanding.

## 2022-05-01 NOTE — Telephone Encounter (Signed)
Notified pt as advised. Lab appt booked along with office visit. Pt confirmed and expressed understanding.

## 2022-05-01 NOTE — Telephone Encounter (Signed)
-----   Message from Nori Riis, PA-C sent at 04/30/2022  8:08 AM EDT ----- Please let Mr. Bjorklund that his labs were fine and I would like to see him in December for PSA, testosterone (in between injections), H & H, I PSS, SHIM and exam.

## 2022-05-01 NOTE — Telephone Encounter (Signed)
Pt saw in [place] that his Testosterone RX changed?? Please advise

## 2022-05-06 ENCOUNTER — Telehealth: Payer: Self-pay | Admitting: Family Medicine

## 2022-05-06 NOTE — Telephone Encounter (Signed)
Patient called and asked the medication SUMAtriptan (IMITREX) 6 MG/0.5ML SOLN injection can be switched over to being able to be stick in leg( the auto injector). Call back number [phone].

## 2022-05-07 MED ORDER — SUMATRIPTAN SUCCINATE 6 MG/0.5ML ~~LOC~~ SOAJ: 6.0000 mg | Freq: Every day | SUBCUTANEOUS | 3 refills | Status: DC | PRN

## 2022-05-07 NOTE — Addendum Note (Signed)
Addended by: Tonia Ghent on: 05/07/2022 05:17 PM   Modules accepted: Orders

## 2022-05-07 NOTE — Telephone Encounter (Signed)
I changed the order and sent it.  Let me know if he can't get it filled. Thanks.

## 2022-05-08 NOTE — Telephone Encounter (Signed)
Patient notified rx was changed  

## 2022-05-22 DIAGNOSIS — M533 Sacrococcygeal disorders, not elsewhere classified: Secondary | ICD-10-CM | POA: Diagnosis not present

## 2022-05-24 ENCOUNTER — Telehealth: Payer: Self-pay | Admitting: Family Medicine

## 2022-05-24 DIAGNOSIS — E038 Other specified hypothyroidism: Secondary | ICD-10-CM

## 2022-05-24 DIAGNOSIS — R7989 Other specified abnormal findings of blood chemistry: Secondary | ICD-10-CM

## 2022-05-24 NOTE — Telephone Encounter (Signed)
Please check with patient about getting nonfasting lab visit set up.  Make sure he is well-hydrated.  I put in the orders.  Thanks.

## 2022-05-25 NOTE — Telephone Encounter (Signed)
Spoke with patient and he stated he will call back when he finds out when he is coming back in town.

## 2022-05-26 ENCOUNTER — Other Ambulatory Visit: Payer: Self-pay | Admitting: Family Medicine

## 2022-06-19 DIAGNOSIS — F332 Major depressive disorder, recurrent severe without psychotic features: Secondary | ICD-10-CM | POA: Diagnosis not present

## 2022-06-19 DIAGNOSIS — F411 Generalized anxiety disorder: Secondary | ICD-10-CM | POA: Diagnosis not present

## 2022-06-19 DIAGNOSIS — F410 Panic disorder [episodic paroxysmal anxiety] without agoraphobia: Secondary | ICD-10-CM | POA: Diagnosis not present

## 2022-06-19 DIAGNOSIS — F4312 Post-traumatic stress disorder, chronic: Secondary | ICD-10-CM | POA: Diagnosis not present

## 2022-06-24 ENCOUNTER — Encounter: Payer: Self-pay | Admitting: Internal Medicine

## 2022-06-29 ENCOUNTER — Other Ambulatory Visit: Payer: Self-pay | Admitting: Urology

## 2022-06-29 DIAGNOSIS — E349 Endocrine disorder, unspecified: Secondary | ICD-10-CM

## 2022-07-01 ENCOUNTER — Encounter: Payer: Self-pay | Admitting: Internal Medicine

## 2022-07-10 DIAGNOSIS — F410 Panic disorder [episodic paroxysmal anxiety] without agoraphobia: Secondary | ICD-10-CM | POA: Diagnosis not present

## 2022-07-10 DIAGNOSIS — F603 Borderline personality disorder: Secondary | ICD-10-CM | POA: Diagnosis not present

## 2022-07-10 DIAGNOSIS — F411 Generalized anxiety disorder: Secondary | ICD-10-CM | POA: Diagnosis not present

## 2022-07-10 DIAGNOSIS — F4312 Post-traumatic stress disorder, chronic: Secondary | ICD-10-CM | POA: Diagnosis not present

## 2022-07-13 ENCOUNTER — Other Ambulatory Visit: Payer: Self-pay | Admitting: Urology

## 2022-07-13 ENCOUNTER — Other Ambulatory Visit: Payer: Self-pay | Admitting: Family Medicine

## 2022-07-13 DIAGNOSIS — N400 Enlarged prostate without lower urinary tract symptoms: Secondary | ICD-10-CM

## 2022-07-15 ENCOUNTER — Other Ambulatory Visit: Payer: Self-pay | Admitting: Urology

## 2022-07-15 DIAGNOSIS — E349 Endocrine disorder, unspecified: Secondary | ICD-10-CM

## 2022-07-20 DIAGNOSIS — Z978 Presence of other specified devices: Secondary | ICD-10-CM | POA: Diagnosis not present

## 2022-07-20 DIAGNOSIS — M545 Low back pain, unspecified: Secondary | ICD-10-CM | POA: Diagnosis not present

## 2022-07-20 DIAGNOSIS — G894 Chronic pain syndrome: Secondary | ICD-10-CM | POA: Diagnosis not present

## 2022-07-22 ENCOUNTER — Other Ambulatory Visit: Payer: Medicare Other

## 2022-07-22 DIAGNOSIS — N400 Enlarged prostate without lower urinary tract symptoms: Secondary | ICD-10-CM

## 2022-07-22 DIAGNOSIS — E349 Endocrine disorder, unspecified: Secondary | ICD-10-CM | POA: Diagnosis not present

## 2022-07-23 LAB — TESTOSTERONE: Testosterone: 1498 ng/dL — ABNORMAL HIGH (ref 264–916)

## 2022-07-23 LAB — PSA: Prostate Specific Ag, Serum: 1.6 ng/mL (ref 0.0–4.0)

## 2022-07-23 LAB — HEMOGLOBIN AND HEMATOCRIT, BLOOD
Hematocrit: 53.3 % — ABNORMAL HIGH (ref 37.5–51.0)
Hemoglobin: 17.7 g/dL (ref 13.0–17.7)

## 2022-07-23 NOTE — Progress Notes (Unsigned)
07/24/2022 8:53 AM   Cody [name] November 17, 1970 [id number]  Referring provider: Tonia Ghent, MD [address] [place],  [place] [address]  Urological history: 1. Testosterone deficiency -contributing factors of age, obesity and chronic opioid use -testosterone (07/2022) 1498 -HCT and hemoglobin (07/2022) 53.3/17.7 -managed with testosterone cypionate 200 mg/cc, 0.75 cc every 14 days   2. BPH with LU TS -PSA (07/2022) 1.6 -Predominantly obstructive urinary symptoms -prostate voume ~ 33 cc (pelvic MRI 01/2022)  -I PSS 12/3 -PVR 0 mL  -Tadalafil 5 mg daily  3. ED -contributing factors of age, testosterone deficiency, BPH, chronic pain medications, smoking, HLD, HTN, depression, anxiety and hypothyroidism -SHIM 24  Chief Complaint  Patient presents with   testosterone defiency     HPI: Cody [name] is a 51 y.o. male who presents today to for follow up.   He states that he accidentally injected himself SQ with testosterone 9 days ago and since he didn't inject in the muscle he felt he injected again.  Last injection was 07/17/2022.    I PSS 12/3  PVR 0 mL   He is experiencing urinary hesitancy with almost every void and it is becoming quite bothersome to him.  He is taking tadalafil 5 mg daily.  Patient denies any modifying or aggravating factors.  Patient denies any gross hematuria, dysuria or suprapubic/flank pain.  Patient denies any fevers, chills, nausea or vomiting.     IPSS     Row Name 07/24/22 1100         International Prostate Symptom Score   How often have you had the sensation of not emptying your bladder? Not at All     How often have you had to urinate less than every two hours? Not at All     How often have you found you stopped and started again several times when you urinated? Less than 1 in 5 times     How often have you found it difficult to postpone urination? Less than 1 in 5 times     How often have you had a weak urinary stream?  More than half the time     How often have you had to strain to start urination? Almost always     How many times did you typically get up at night to urinate? 1 Time     Total IPSS Score 12       Quality of Life due to urinary symptoms   If you were to spend the rest of your life with your urinary condition just the way it is now how would you feel about that? Mixed                Score:  1-7 Mild 8-19 Moderate 20-35 Severe  SHIM 24  Patient still having spontaneous erections.  He denies any pain or curvature with erections.     SHIM     Row Name 07/27/22 0846         SHIM: Over the last 6 months:   How do you rate your confidence that you could get and keep an erection? High     When you had erections with sexual stimulation, how often were your erections hard enough for penetration (entering your partner)? Almost Always or Always     During sexual intercourse, how often were you able to maintain your erection after you had penetrated (entered) your partner? Almost Always or Always     During sexual  intercourse, how difficult was it to maintain your erection to completion of intercourse? Not Difficult     When you attempted sexual intercourse, how often was it satisfactory for you? 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; herniated disc L5- S1   Degenerative disk disease    Depression    Fracture, mandible ([place])    Fracture, tibia    Headache    Hyperlipidemia    Hypertension    resolved    Hypothyroidism 05/2007   Dr Unice Cobble (retired)/ Dr Elsie Stain   Neuromuscular disorder [facility])    PTSD (post-traumatic stress disorder)    Suicidal behavior    a.) ideations with attempt x 1    Surgical History: Past Surgical History:  Procedure Laterality Date   APPENDECTOMY     peritonitis   BACK SURGERY     EVALUATION UNDER ANESTHESIA WITH HEMORRHOIDECTOMY N/A 01/28/2022   Procedure: EXAM UNDER ANESTHESIA WITH HEMORRHOIDECTOMY;  Surgeon: Ronny Bacon, MD;  Location: [facility];  Service: General;  Laterality: N/A;   FRACTURE SURGERY     INTRATHECAL PUMP IMPLANTATION  2007   LAMINECTOMY  2004   arterial injury, transfused 7 units pc, implant surgery    MANDIBLE FRACTURE SURGERY     mugged   THORACIC DISCECTOMY     T10   UPPER GASTROINTESTINAL ENDOSCOPY  2006   gastritis    Home Medications:  Allergies as of 07/24/2022       Reactions   Neurontin [gabapentin]    lethargic   Vancomycin    Swelling    Doxycycline    vomiting   Lithium    tremor   Mirtazapine    REACTION: lethargy ( Remeron)        Medication List        Accurate as of July 24, 2022 11:59 PM. If you have any questions, ask your nurse or doctor.          Aimovig 140 MG/ML Soaj Generic drug: Erenumab-aooe Inject 140 mg into the skin every 30 (thirty) days.   ALPRAZolam 1 MG tablet Commonly known as: XANAX SMARTSIG:0.5 Tablet(s) By Mouth 1-2 Times Daily   AMBULATORY NON FORMULARY MEDICATION 8.608 mg by Intrathecal Infusion route daily. Morphine Pump Implant   atorvastatin 10 MG tablet Commonly known as: LIPITOR TAKE 1 TABLET BY MOUTH DAILY   B-D 3CC LUER-LOK SYR 21GX1-1/2 21G X 1-1/2" 3 ML Misc Generic drug: SYRINGE-NEEDLE (DISP) 3 ML Use this needle to injection   BD SafetyGlide Needle 18G X 1-1/2" Misc Generic drug: NEEDLE (DISP) 18 G Draw up   buPROPion 150 MG 24 hr tablet Commonly known as: WELLBUTRIN XL Take 150 mg by mouth every morning.   busPIRone 10 MG  tablet Commonly known as: BUSPAR Take 1 tablet (10 mg total) by mouth 2 (two) times  daily.   Caplyta 42 MG capsule Generic drug: lumateperone tosylate Take 42 mg by mouth daily.   hydrOXYzine 50 MG tablet Commonly known as: ATARAX Take 1 tablet (50 mg total) by mouth in the morning and at bedtime. BID   lamoTRIgine 100 MG tablet Commonly known as: LAMICTAL Take 100 mg by mouth 2 (two) times daily.   levothyroxine 75 MCG tablet Commonly known as: SYNTHROID TAKE 1 TABLET BY MOUTH DAILY  BEFORE BREAKFAST   Nucynta 100 MG Tabs Generic drug: Tapentadol HCl Take 1 tablet by mouth every 6 (six) hours as needed.   Omega-3 1000 MG Caps Take 2 each by mouth daily.   OVER THE COUNTER MEDICATION Take 1 Piece by mouth daily. CBD gummy (contains small amount of THC)   polyethylene glycol powder 17 GM/SCOOP powder Commonly known as: GLYCOLAX/MIRALAX Take 17 g by mouth every other day as needed.   promethazine 25 MG tablet Commonly known as: PHENERGAN TAKE 1/2 TO 1 TABLET BY MOUTH EVERY 8 HOURS AS NEEDED FOR NAUSEA AND VOMITING   SUMAtriptan 6 MG/0.5ML Soaj Commonly known as: Imitrex STATdose System Inject 6 mg into the skin daily as needed (with 2nd dose 2 hours after 1st dose if needed.).   tadalafil 5 MG tablet Commonly known as: CIALIS TAKE 1 TABLET BY MOUTH DAILY AS  NEEDED   testosterone cypionate 200 MG/ML injection Commonly known as: DEPOTESTOSTERONE CYPIONATE INJECT 0.75 ML INTO THE MUSCLE EVERY 14 DAYS   tiZANidine 4 MG tablet Commonly known as: ZANAFLEX Take 4 mg by mouth as needed.   topiramate 50 MG tablet Commonly known as: TOPAMAX Take 50 mg by mouth daily.   Trintellix 10 MG Tabs tablet Generic drug: vortioxetine HBr Take 10 mg by mouth daily.   Vitamin D 50 MCG (2000 UT) Caps Take 1 capsule by mouth daily.        Allergies:  Allergies  Allergen Reactions   Neurontin [Gabapentin]     lethargic   Vancomycin     Swelling    Doxycycline      vomiting   Lithium     tremor   Mirtazapine     REACTION: lethargy ( Remeron)    Family History: Family History  Problem Relation Age of Onset   Ulcers Mother    Depression Mother    Thyroid disease Mother        hypo   Stroke Father 61   Thyroid disease Father        hypo   Lung cancer Maternal Grandmother    Thyroid disease Maternal Grandmother        hypo   Colon cancer Maternal Grandfather    Heart attack Maternal Grandfather 55   Esophageal cancer Maternal Grandfather    Diabetes Neg Hx    Prostate cancer Neg Hx    Kidney cancer Neg Hx    Bladder Cancer Neg Hx    Migraines Neg Hx    Headache Neg Hx     Social History:  reports that he quit smoking about 12 years ago. His smoking use included e-cigarettes and cigarettes. He smoked an average of .25 packs per day. He has never been exposed to tobacco smoke. He has never used smokeless tobacco. He reports that he does not drink alcohol and does not use drugs.  ROS: Pertinent ROS in HPI  Physical Exam: BP 136/81   Pulse (!) 111   Ht 5' 5.5" (1.664 m)   Wt 200 lb (90.7 kg)  BMI 32.78 kg/m   Constitutional:  Well nourished. Alert and oriented, No acute distress. HEENT: [place] AT, moist mucus membranes.  Trachea midline Cardiovascular: No clubbing, cyanosis, or edema. Respiratory: Normal respiratory effort, no increased work of breathing. GU: No CVA tenderness.  No bladder fullness or masses.  Patient with circumcised phallus.  Urethral meatus is patent.  No penile discharge. No penile lesions or rashes. Scrotum without lesions, cysts, rashes and/or edema.  Testicles are atrophic,  located scrotally bilaterally. No masses are appreciated in the testicles. Left and right epididymis are normal. Rectal: Patient with  normal sphincter tone. Anus and perineum without scarring or rashes. No rectal masses are appreciated. Prostate is approximately 45 grams, no nodules are appreciated. Seminal vesicles could not be  palpated Neurologic: Grossly intact, no focal deficits, moving all 4 extremities. Psychiatric: Normal mood and affect.   Laboratory Data: See urological history and epic I have reviewed the labs.  Pertinent Imaging: No recent imaging  Assessment & Plan:    1. Testosterone deficiency  -testosterone levels are super therapeutic -Hemoglobin is WNL, Hematocrit is increased to 53.3 -hold testosterone cypionate for two months and then repeat testosterone level, H & H -explained that increased testosterone levels and increasing hematocrit can put him at risk for strokes or heart attacks   2. BPH with LUTS -PSA stable -prostate volume < 40 cc  -continue conservative management, avoiding bladder irritants and timed voiding's -Continue Cialis 5 mg daily -he is experiencing increasingly bothersome hesitancy in spite of taking the tadalafil 5 mg daily -discussed that this may be a symptom of BPH/BOO and he may want to consider a bladder outlet procedure  -he is interested in seeing if he would be a candidate I have explained to the patient that they will  be scheduled for a cystoscopy in our office to evaluate their bladder.  The cystoscopy consists of passing a tube with a lens up through their urethra and into their urinary bladder.   We will inject the urethra with a lidocaine gel prior to introducing the cystoscope to help with any discomfort during the procedure.   After the procedure, they might experience blood in the urine and discomfort with urination.  This will abate after the first few voids.  I have  encouraged the patient to increase water intake  during this time.  Patient denies any allergies to lidocaine.   -cysto   3. Erectile dysfunction:    - improved on TRT and tadalafil  -continue tadalafil 5 mg daily   Return for cystoscopy with Dr. Lonna Cobb to evaluate for BOO     Return in 2 months (on 09/24/2022) for testosterone, H & H.   These notes generated with voice recognition  software. I apologize for typographical errors.  [facility] [address]  [address] [place], [place] [address] [phone]

## 2022-07-24 ENCOUNTER — Ambulatory Visit (INDEPENDENT_AMBULATORY_CARE_PROVIDER_SITE_OTHER): Payer: BC Managed Care – PPO | Admitting: Urology

## 2022-07-24 VITALS — BP 136/81 | HR 111 | Ht 65.5 in | Wt 200.0 lb

## 2022-07-24 DIAGNOSIS — E349 Endocrine disorder, unspecified: Secondary | ICD-10-CM

## 2022-07-24 DIAGNOSIS — N400 Enlarged prostate without lower urinary tract symptoms: Secondary | ICD-10-CM

## 2022-07-24 DIAGNOSIS — N401 Enlarged prostate with lower urinary tract symptoms: Secondary | ICD-10-CM | POA: Diagnosis not present

## 2022-07-24 DIAGNOSIS — N529 Male erectile dysfunction, unspecified: Secondary | ICD-10-CM

## 2022-07-24 DIAGNOSIS — E291 Testicular hypofunction: Secondary | ICD-10-CM | POA: Diagnosis not present

## 2022-07-24 LAB — BLADDER SCAN AMB NON-IMAGING: Scan Result: 0

## 2022-07-27 ENCOUNTER — Encounter: Payer: Self-pay | Admitting: Urology

## 2022-07-27 IMAGING — MR MR HEAD WO/W CM
14 series · 48 of 48 positions shown · IV contrast (gadavist)
Comparison: 11/23/2019.

CLINICAL DATA: Positional headache that is new or worsening

EXAM:
MRI HEAD WITHOUT AND WITH CONTRAST
TECHNIQUE: Multiplanar, multiecho pulse sequences of the brain and surrounding
structures were obtained without and with intravenous contrast.
CONTRAST:  9mL GADAVIST GADOBUTROL 1 MMOL/ML IV SOLN

[Series 5: ax dwi_tracew · axial · 3.0mm · 0.65mm/px · z∈[-89,+63]mm · 4 of 48 slices shown]
[im 1/48]
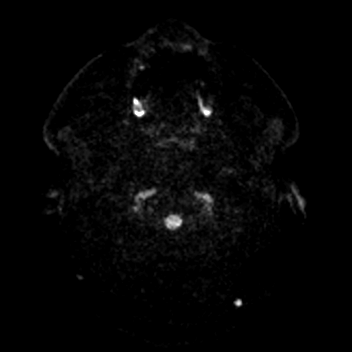
[im 16/48]
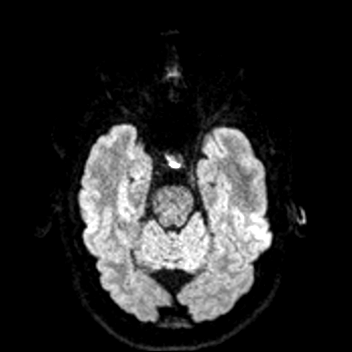
[im 32/48]
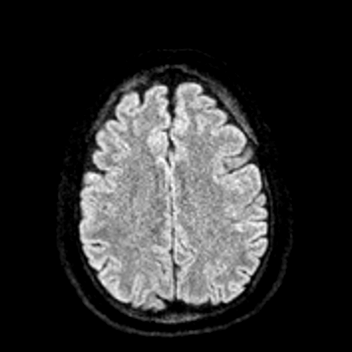
[im 48/48]
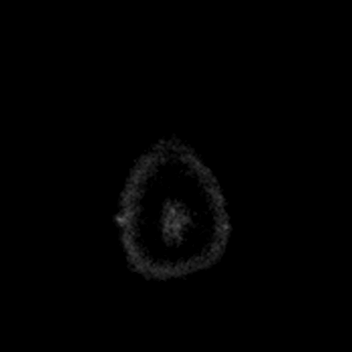

[Series 6: ax dwi_adc · axial · 3.0mm · 0.65mm/px · z∈[-89,+63]mm · 3 of 48 slices shown]
[im 1/48]
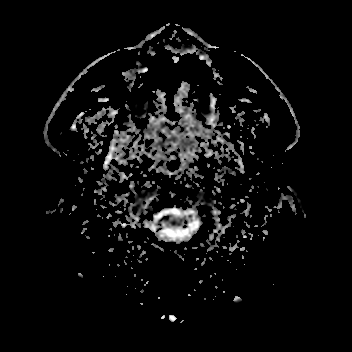
[im 24/48]
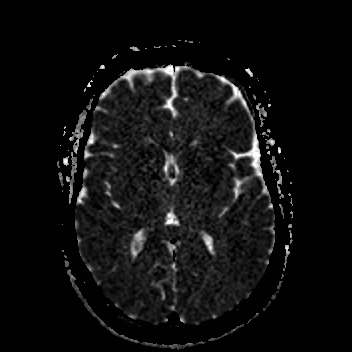
[im 48/48]
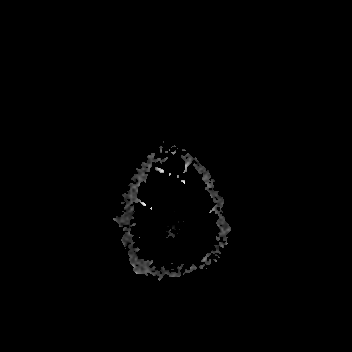

[Series 7: cor dwi_tracew · coronal · 5.0mm · 0.65mm/px · 2 of 40 slices shown]
[im 1/40]
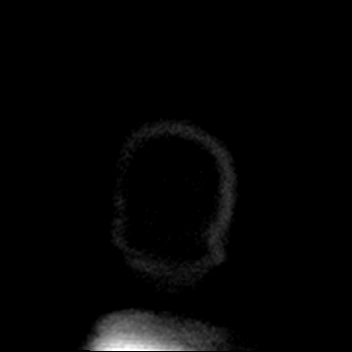
[im 40/40]
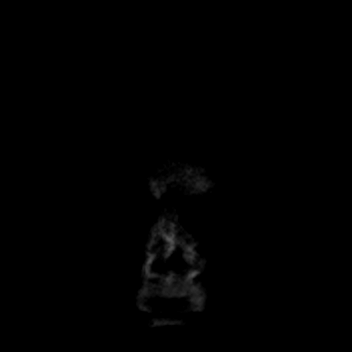

[Series 8: cor dwi_adc · coronal · 5.0mm · 0.65mm/px · 2 of 40 slices shown]
[im 1/40]
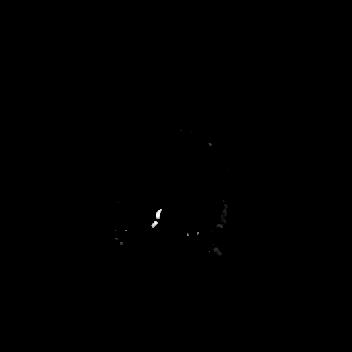
[im 40/40]
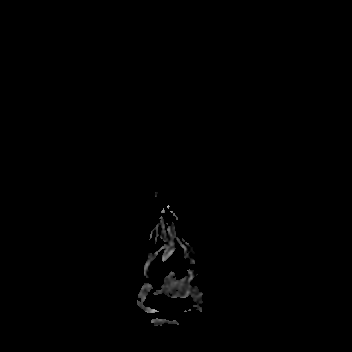

[Series 9: T1 · sagittal · 5.0mm · 0.62mm/px · 1 of 23 slices shown (1 of 2)]
[im 1/23]
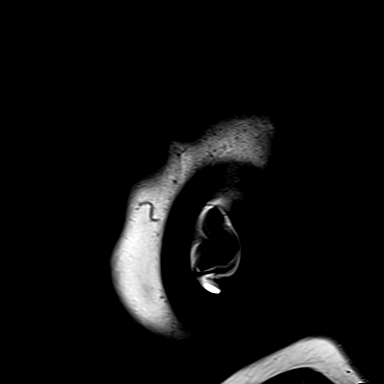

[Series 10: T2 · axial · 5.0mm · 0.53mm/px · 1 of 25 slices shown]
[im 1/25]
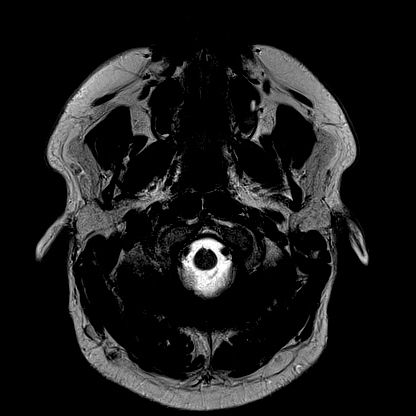

[Series 12: pha_images · axial · 3.0mm · 0.90mm/px · z∈[-98,+75]mm · 3 of 60 slices shown]
[im 1/60]
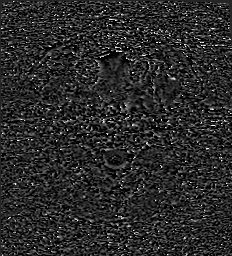
[im 30/60]
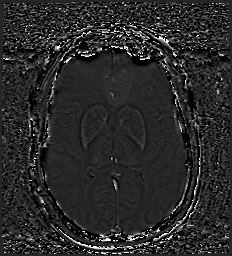
[im 60/60]
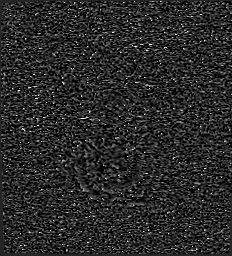

[Series 13: swi_images · axial · 3.0mm · 0.90mm/px · z∈[-98,+75]mm · 3 of 60 slices shown]
[im 1/60]
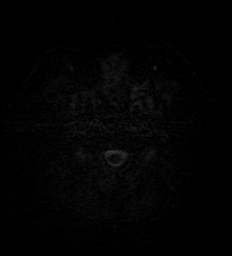
[im 30/60]
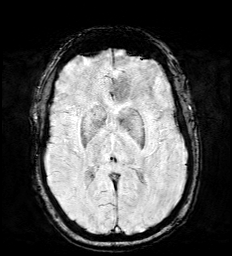
[im 60/60]
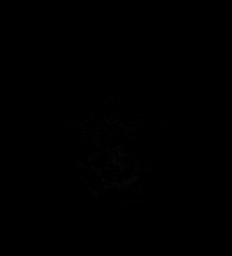

[Series 15: FLAIR · axial · 3.0mm · 0.53mm/px · z∈[-92,+66]mm · 3 of 55 slices shown (1 of 2)]
[im 1/55]
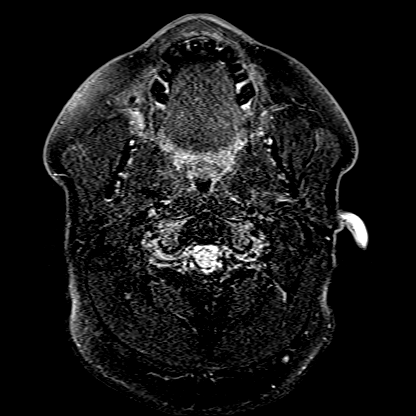
[im 28/55]
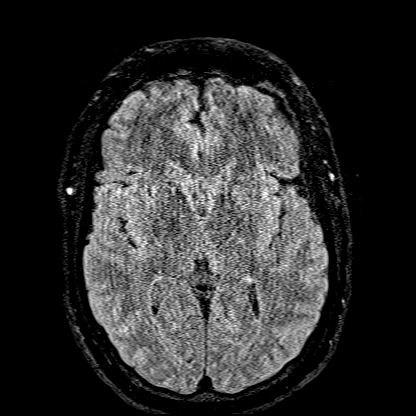
[im 55/55]
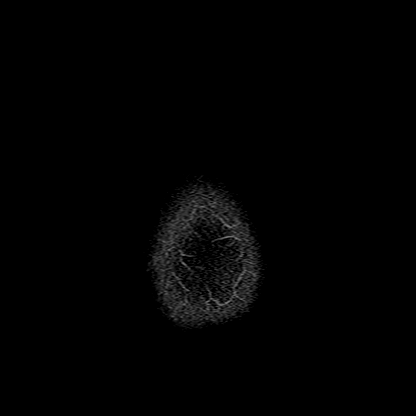

[Series 16: T1 · axial · 1.0mm · 0.98mm/px · z∈[-96,+75]mm · 10 of 176 slices shown (2 of 2)]
[im 1/176]
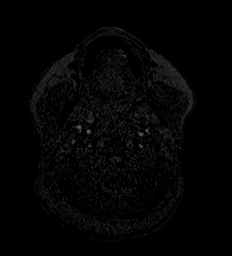
[im 20/176]
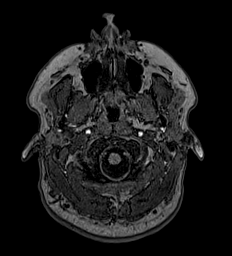
[im 39/176]
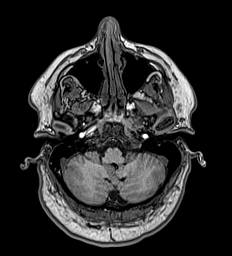
[im 59/176]
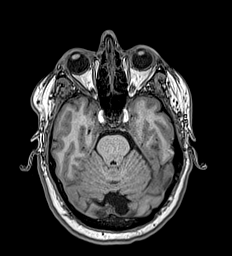
[im 78/176]
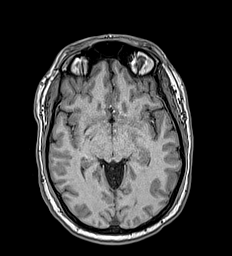
[im 98/176]
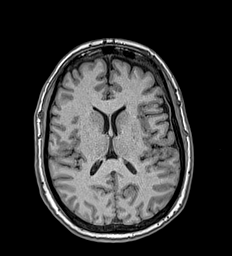
[im 117/176]
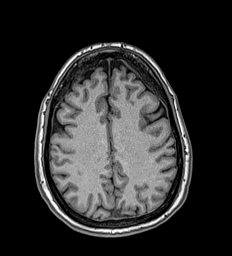
[im 137/176]
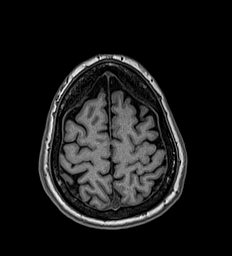
[im 156/176]
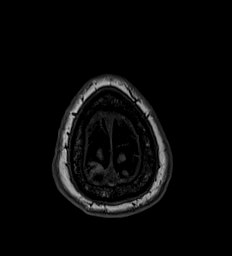
[im 176/176]
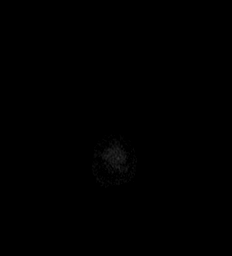

[Series 17: FLAIR · axial · 5.0mm · 1.20mm/px · z∈[-89,+63]mm · 2 of 27 slices shown (2 of 2)]
[im 1/27]
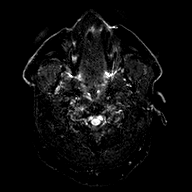
[im 27/27]
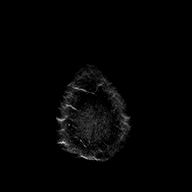

[Series 18: T2 post-contrast · coronal · 5.0mm · 0.57mm/px · 2 of 29 slices shown]
[im 1/29]
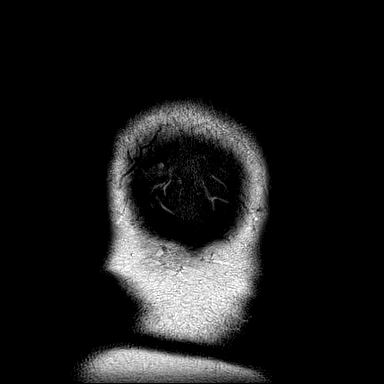
[im 29/29]
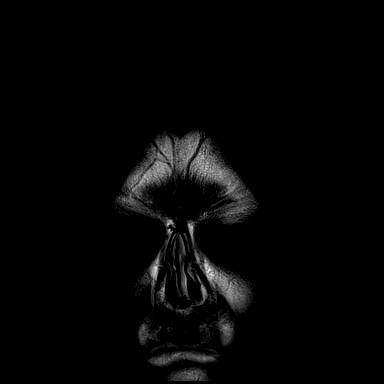

[Series 19: T1 post-contrast · axial · 1.0mm · 0.98mm/px · z∈[-96,+75]mm · 10 of 176 slices shown (1 of 2)]
[im 1/176]
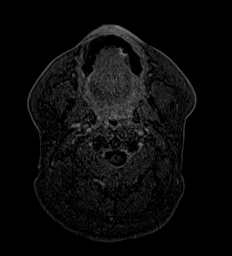
[im 20/176]
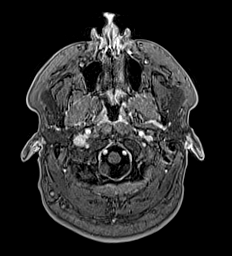
[im 39/176]
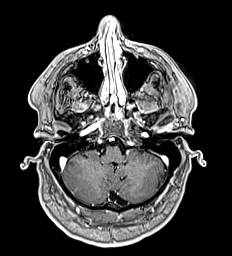
[im 59/176]
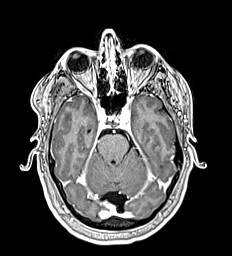
[im 78/176]
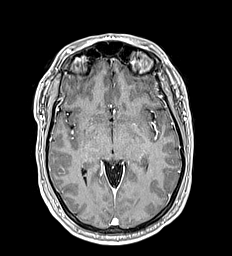
[im 98/176]
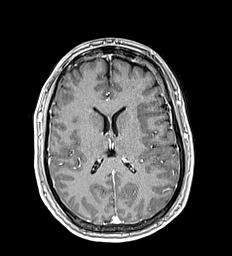
[im 117/176]
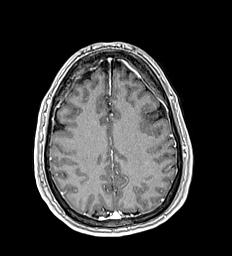
[im 137/176]
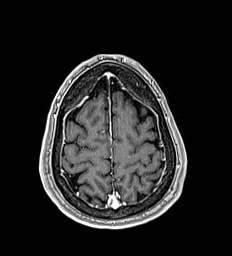
[im 156/176]
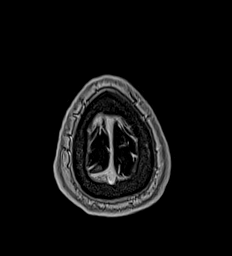
[im 176/176]
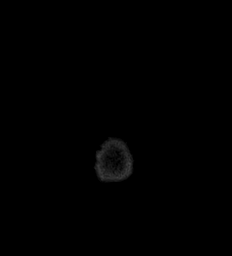

[Series 20: T1 post-contrast · coronal · 5.0mm · 0.57mm/px · 2 of 29 slices shown (2 of 2)]
[im 1/29]
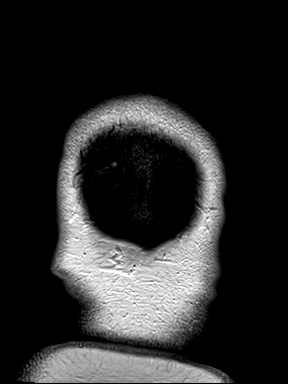
[im 29/29]
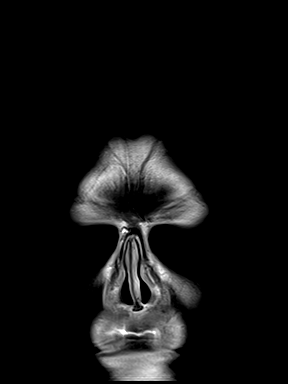

[48 of 48 positions shown; findings below may reference images not displayed]

FINDINGS: Brain: No infarction, hemorrhage, hydrocephalus, extra-axial
collection or mass lesion. No evidence of brain sagging. No abnormal
enhancement. Mild and incidental mega cisterna magna.

Vascular: Normal flow voids.

Skull and upper cervical spine: Normal marrow signal.

Sinuses/Orbits: Negative.
IMPRESSION: Normal brain MRI.

## 2022-07-30 ENCOUNTER — Encounter: Payer: BC Managed Care – PPO | Admitting: Internal Medicine

## 2022-08-04 ENCOUNTER — Other Ambulatory Visit: Payer: Self-pay | Admitting: Family Medicine

## 2022-08-12 ENCOUNTER — Other Ambulatory Visit: Payer: Medicare Other

## 2022-08-12 ENCOUNTER — Other Ambulatory Visit: Payer: BC Managed Care – PPO

## 2022-08-12 DIAGNOSIS — F411 Generalized anxiety disorder: Secondary | ICD-10-CM | POA: Diagnosis not present

## 2022-08-12 DIAGNOSIS — F410 Panic disorder [episodic paroxysmal anxiety] without agoraphobia: Secondary | ICD-10-CM | POA: Diagnosis not present

## 2022-08-12 DIAGNOSIS — F4312 Post-traumatic stress disorder, chronic: Secondary | ICD-10-CM | POA: Diagnosis not present

## 2022-08-12 DIAGNOSIS — F603 Borderline personality disorder: Secondary | ICD-10-CM | POA: Diagnosis not present

## 2022-08-13 ENCOUNTER — Other Ambulatory Visit: Payer: Medicare Other

## 2022-08-13 ENCOUNTER — Ambulatory Visit (AMBULATORY_SURGERY_CENTER): Payer: Medicare Other | Admitting: *Deleted

## 2022-08-13 VITALS — Ht 65.5 in | Wt 190.0 lb

## 2022-08-13 DIAGNOSIS — N400 Enlarged prostate without lower urinary tract symptoms: Secondary | ICD-10-CM

## 2022-08-13 DIAGNOSIS — E349 Endocrine disorder, unspecified: Secondary | ICD-10-CM

## 2022-08-13 DIAGNOSIS — Z1211 Encounter for screening for malignant neoplasm of colon: Secondary | ICD-10-CM

## 2022-08-13 MED ORDER — NA SULFATE-K SULFATE-MG SULF 17.5-3.13-1.6 GM/177ML PO SOLN: 1.0000 | Freq: Once | ORAL | 0 refills | Status: AC

## 2022-08-13 NOTE — Progress Notes (Signed)
No egg or soy allergy known to patient  No issues known to pt with past sedation with any surgeries or procedures Patient denies ever being told they had issues or difficulty with intubation  No FH of Malignant Hyperthermia Pt is not on diet pills Pt is not on  home 02  Pt is not on blood thinners  Pt denies issues with constipation  Pt is not on dialysis Pt denies any upcoming cardiac testing Pt encouraged to use to use Singlecare or Goodrx to reduce cost  Patient's chart reviewed by Osvaldo Angst CNRA prior to previsit and patient appropriate for the [place].  Previsit completed and red dot placed by patient's name on their procedure day (on provider's schedule).  . Visit done by phone with coupon and my chart Instructed pt not to vape on day. Pt states he will

## 2022-08-14 ENCOUNTER — Telehealth: Payer: Self-pay | Admitting: Family Medicine

## 2022-08-14 DIAGNOSIS — E230 Hypopituitarism: Secondary | ICD-10-CM

## 2022-08-14 LAB — HEMATOCRIT: Hematocrit: 50.9 % (ref 37.5–51.0)

## 2022-08-14 LAB — TESTOSTERONE: Testosterone: 150 ng/dL — ABNORMAL LOW (ref 264–916)

## 2022-08-14 LAB — HEMOGLOBIN: Hemoglobin: 17.4 g/dL (ref 13.0–17.7)

## 2022-08-14 NOTE — Telephone Encounter (Signed)
-----   Message from Nori Riis, PA-C sent at 08/14/2022  8:01 AM EST ----- Please let Mr. Golightly know that his labs have returned to normal and he can resume his testosterone injections.  I would like the testosterone, H & H repeated again in three months one week after an injection.

## 2022-08-14 NOTE — Telephone Encounter (Signed)
Patient notified and voiced understanding. Lab appointment has been scheduled.  

## 2022-08-19 ENCOUNTER — Encounter: Payer: Self-pay | Admitting: Urology

## 2022-08-19 ENCOUNTER — Ambulatory Visit (INDEPENDENT_AMBULATORY_CARE_PROVIDER_SITE_OTHER): Payer: BC Managed Care – PPO | Admitting: Urology

## 2022-08-19 VITALS — BP 144/77 | HR 103 | Ht 67.0 in | Wt 190.0 lb

## 2022-08-19 DIAGNOSIS — R350 Frequency of micturition: Secondary | ICD-10-CM

## 2022-08-19 DIAGNOSIS — N400 Enlarged prostate without lower urinary tract symptoms: Secondary | ICD-10-CM

## 2022-08-19 LAB — MICROSCOPIC EXAMINATION

## 2022-08-19 LAB — URINALYSIS, COMPLETE
Bilirubin, UA: NEGATIVE
Glucose, UA: NEGATIVE
Ketones, UA: NEGATIVE
Nitrite, UA: NEGATIVE
Protein,UA: NEGATIVE
Specific Gravity, UA: 1.015 (ref 1.005–1.030)
Urobilinogen, Ur: 0.2 mg/dL (ref 0.2–1.0)
pH, UA: 6.5 (ref 5.0–7.5)

## 2022-08-19 MED ORDER — TAMSULOSIN HCL 0.4 MG PO CAPS: 0.4000 mg | ORAL_CAPSULE | Freq: Every day | ORAL | 3 refills | Status: DC

## 2022-08-19 MED ORDER — CIPROFLOXACIN HCL 500 MG PO TABS
500.0000 mg | ORAL_TABLET | Freq: Once | ORAL | Status: AC
  Administered 2022-08-19: 500 mg via ORAL

## 2022-08-19 NOTE — Progress Notes (Signed)
   08/19/22  CC:  Chief Complaint  Patient presents with   Cysto    HPI: Refer to [facility]'s note 07/24/2022.  Voiding symptoms have not changed.  UA today with 11-30 WBC/11-30 RBC however he is asymptomatic other than his urinary hesitancy  Blood pressure (!) 144/77, pulse (!) 103, height 5\' 7"  (1.702 m), weight 190 lb (86.2 kg). NED. A&Ox3.   No respiratory distress   Abd soft, NT, ND Normal phallus with bilateral descended testicles  Cystoscopy Procedure Note  Patient identification was confirmed, informed consent was obtained, and patient was prepped using Betadine solution.  Lidocaine jelly was administered per urethral meatus.     Pre-Procedure: - Inspection reveals a normal caliber urethral meatus.  Procedure: The flexible cystoscope was introduced without difficulty - No urethral strictures/lesions are present. -Mild lateral lobe enlargement prostate  -Mild elevation bladder neck - Bilateral ureteral orifices identified - Bladder mucosa  reveals no ulcers, tumors, or lesions - No bladder stones - No trabeculation  Retroflexion shows no abnormalities   Post-Procedure: - Patient tolerated the procedure well  Assessment/ Plan: Mild prostate enlargement.  Symptoms may be secondary to increased adrenergic tone bladder neck/prostate Trial tamsulosin 0.4 mg daily Urine culture ordered.  Oral antibiotic received post cystoscopy    Abbie Sons, MD

## 2022-08-25 ENCOUNTER — Other Ambulatory Visit: Payer: Self-pay | Admitting: Urology

## 2022-08-25 DIAGNOSIS — E349 Endocrine disorder, unspecified: Secondary | ICD-10-CM

## 2022-08-25 LAB — CULTURE, URINE COMPREHENSIVE

## 2022-08-26 ENCOUNTER — Other Ambulatory Visit: Payer: Self-pay | Admitting: Family Medicine

## 2022-08-27 ENCOUNTER — Other Ambulatory Visit: Payer: Self-pay | Admitting: *Deleted

## 2022-08-27 MED ORDER — SULFAMETHOXAZOLE-TRIMETHOPRIM 800-160 MG PO TABS: 1.0000 | ORAL_TABLET | Freq: Two times a day (BID) | ORAL | 0 refills | Status: AC

## 2022-08-27 NOTE — Telephone Encounter (Signed)
Talked with DR. Stoioff and called in BACTRIM ds 1 bid for 14 days.

## 2022-08-28 ENCOUNTER — Encounter: Payer: Self-pay | Admitting: *Deleted

## 2022-09-09 ENCOUNTER — Encounter: Payer: Self-pay | Admitting: Internal Medicine

## 2022-09-09 ENCOUNTER — Ambulatory Visit (AMBULATORY_SURGERY_CENTER): Payer: BC Managed Care – PPO | Admitting: Internal Medicine

## 2022-09-09 VITALS — BP 105/69 | HR 76 | Temp 99.0°F | Resp 13 | Ht 65.5 in | Wt 190.0 lb

## 2022-09-09 DIAGNOSIS — Z1211 Encounter for screening for malignant neoplasm of colon: Secondary | ICD-10-CM | POA: Diagnosis not present

## 2022-09-09 MED ORDER — SODIUM CHLORIDE 0.9 % IV SOLN: 500.0000 mL | Freq: Once | INTRAVENOUS | Status: DC

## 2022-09-09 NOTE — Progress Notes (Signed)
A/ox3, pleased with MAC, report to RN 

## 2022-09-09 NOTE — Patient Instructions (Addendum)
No polyps or cancer were seen. Normal exam.  Next routine colonoscopy or other screening test in 10 years - 2034.  I appreciate the opportunity to care for you. Gatha Mayer, MD, FACG  YOU HAD AN ENDOSCOPIC PROCEDURE TODAY AT [facility]:   Refer to the procedure report that was given to you for any specific questions about what was found during the examination.  If the procedure report does not answer your questions, please call your gastroenterologist to clarify.  If you requested that your care partner not be given the details of your procedure findings, then the procedure report has been included in a sealed envelope for you to review at your convenience later.  YOU SHOULD EXPECT: Some feelings of bloating in the abdomen. Passage of more gas than usual.  Walking can help get rid of the air that was put into your GI tract during the procedure and reduce the bloating. If you had a lower endoscopy (such as a colonoscopy or flexible sigmoidoscopy) you may notice spotting of blood in your stool or on the toilet paper. If you underwent a bowel prep for your procedure, you may not have a normal bowel movement for a few days.  Please Note:  You might notice some irritation and congestion in your nose or some drainage.  This is from the oxygen used during your procedure.  There is no need for concern and it should clear up in a day or so.  SYMPTOMS TO REPORT IMMEDIATELY:  Following lower endoscopy (colonoscopy or flexible sigmoidoscopy):  Excessive amounts of blood in the stool  Significant tenderness or worsening of abdominal pains  Swelling of the abdomen that is new, acute  Fever of 100F or higher    For urgent or emergent issues, a gastroenterologist can be reached at any hour by calling [phone]. Do not use MyChart messaging for urgent concerns.    DIET:  We do recommend a small meal at first, but then you may proceed to your regular diet.  Drink plenty of  fluids but you should avoid alcoholic beverages for 24 hours.  ACTIVITY:  You should plan to take it easy for the rest of today and you should NOT DRIVE or use heavy machinery until tomorrow (because of the sedation medicines used during the test).    FOLLOW UP: Our staff will call the number listed on your records the next business day following your procedure.  We will call around 7:15- 8:00 am to check on you and address any questions or concerns that you may have regarding the information given to you following your procedure. If we do not reach you, we will leave a message.     If any biopsies were taken you will be contacted by phone or by letter within the next 1-3 weeks.  Please call us at [phone] if you have not heard about the biopsies in 3 weeks.    SIGNATURES/CONFIDENTIALITY: You and/or your care partner have signed paperwork which will be entered into your electronic medical record.  These signatures attest to the fact that that the information above on your After Visit Summary has been reviewed and is understood.  Full responsibility of the confidentiality of this discharge information lies with you and/or your care-partner.

## 2022-09-09 NOTE — Progress Notes (Signed)
[facility] History and Physical   Primary Care Physician:  Joaquim Nam, MD   Reason for Procedure:   CRCA screening  Plan:    colonoscopy     HPI: Cody [name] is a 52 y.o. male w/ hx opioid constipation and negative screening colonoscopy 2013   Past Medical History:  Diagnosis Date   Anxiety    Bipolar 2 disorder (HCC) 03/2009   Dr Caryn Section   Chronic back pain    Chronic pain syndrome    after he had a hematoma from back surgery, "pressure on spine"   Complication of anesthesia    WOKE UP SHAKING VIOLENTLY   DDD (degenerative disc disease)    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; herniated disc L5- S1   Degenerative disk disease    Depression    Fracture, mandible (HCC)    Fracture, tibia    Headache    Hyperlipidemia    Hypertension    resolved   Hypothyroidism 05/2007   Dr Marga Melnick (retired)/ Dr Crawford Givens   Neuromuscular disorder [facility])    PTSD (post-traumatic stress disorder)    Suicidal behavior    a.) ideations with attempt x 1    Past Surgical History:  Procedure Laterality Date   APPENDECTOMY     peritonitis   BACK SURGERY     EVALUATION UNDER ANESTHESIA WITH HEMORRHOIDECTOMY N/A 01/28/2022   Procedure: EXAM UNDER ANESTHESIA WITH HEMORRHOIDECTOMY;  Surgeon: Campbell Lerner, MD;  Location: [facility] ORS;  Service: General;  Laterality: N/A;   FRACTURE SURGERY     INTRATHECAL PUMP IMPLANTATION  2007   LAMINECTOMY  2004   arterial injury, transfused 7 units pc, implant surgery    MANDIBLE FRACTURE SURGERY     mugged   THORACIC DISCECTOMY     T10   UPPER GASTROINTESTINAL ENDOSCOPY  2006   gastritis    Prior to Admission medications   Medication Sig Start Date End Date Taking? Authorizing Provider  ALPRAZolam Prudy Feeler) 1 MG tablet SMARTSIG:0.5 Tablet(s) By Mouth 1-2 Times Daily 02/02/22   [provider]  AMBULATORY NON FORMULARY MEDICATION 8.608 mg by Intrathecal Infusion route daily. Morphine Pump Implant    [provider]  atorvastatin (LIPITOR) 10 MG tablet TAKE 1 TABLET BY MOUTH DAILY 04/28/22   Joaquim Nam, MD  buPROPion (WELLBUTRIN XL) 150 MG 24 hr tablet Take 150 mg by mouth every morning. 02/14/22   [provider]  busPIRone (BUSPAR) 10 MG tablet Take 1 tablet (10 mg total) by mouth 2 (two) times daily. 03/03/21   Joaquim Nam, MD  CAPLYTA 42 MG capsule Take 42 mg by mouth daily. 11/11/21   [provider]  Cholecalciferol (VITAMIN D) 50 MCG (2000 UT) CAPS Take 1 capsule by mouth daily.    [provider]  Erenumab-aooe (AIMOVIG) 140 MG/ML SOAJ Inject 140 mg into the skin every 30 (thirty) days. 03/19/22   Lomax, Amy, NP  hydrOXYzine (ATARAX) 50 MG tablet Take 1 tablet (50 mg total) by mouth in the morning and at bedtime. BID 03/17/22   Donovan Kail, PA-C  lamoTRIgine (LAMICTAL) 100 MG tablet Take 100 mg by mouth 2 (two) times daily. 02/22/22   [provider]  levothyroxine (SYNTHROID) 75 MCG tablet TAKE 1 TABLET BY MOUTH DAILY  BEFORE BREAKFAST 07/13/22   Joaquim Nam, MD  NEEDLE, DISP, 18 G (BD SAFETYGLIDE NEEDLE) 18G X 1-1/2" MISC Draw up 11/11/21   Harle Battiest, PA-C  NUCYNTA 100  MG TABS Take 1 tablet by mouth every 6 (six) hours as needed. 02/12/22   [provider]  Omega-3 1000 MG CAPS Take 2 each by mouth daily.    [provider]  OVER THE COUNTER MEDICATION Take 1 Piece by mouth daily. CBD gummy (contains small amount of THC)    [provider]  polyethylene glycol powder (GLYCOLAX/MIRALAX) 17 GM/SCOOP powder Take 17 g by mouth every other day as needed. 03/03/21   Tonia Ghent, MD  promethazine (PHENERGAN) 25 MG tablet TAKE 1/2 TO 1 TABLET BY MOUTH EVERY 8 HOURS AS NEEDED FOR NAUSEA AND VOMITING 08/04/22   Tonia Ghent, MD  sulfamethoxazole-trimethoprim (BACTRIM DS) 800-160 MG tablet Take 1 tablet by mouth 2 (two) times daily for 14 days. 08/27/22 09/10/22  Stoioff, Ronda Fairly, MD  SUMAtriptan 6 MG/0.5ML SOAJ INJECT 6 MG  INTO THE SKIN DAILY AS NEEDED (WITH 2ND DOSE 2 HOURS AFTER 1ST DOSE IF NEEDED.). 08/26/22   Tonia Ghent, MD  SYRINGE-NEEDLE, DISP, 3 ML (B-D 3CC LUER-LOK SYR 21GX1-1/2) 21G X 1-1/2" 3 ML MISC Use this needle to injection 11/11/21   McGowan, Larene Beach A, PA-C  tadalafil (CIALIS) 5 MG tablet TAKE 1 TABLET BY MOUTH DAILY AS  NEEDED 07/13/22   McGowan, Larene Beach A, PA-C  tamsulosin (FLOMAX) 0.4 MG CAPS capsule Take 1 capsule (0.4 mg total) by mouth daily after breakfast. 08/19/22   Stoioff, Ronda Fairly, MD  testosterone cypionate (DEPOTESTOSTERONE CYPIONATE) 200 MG/ML injection INJECT 0.75 ML INTO THE MUSCLE EVERY 14 DAYS 08/25/22   McGowan, Larene Beach A, PA-C  tiZANidine (ZANAFLEX) 4 MG tablet Take 4 mg by mouth as needed. 02/17/21   [provider]  topiramate (TOPAMAX) 50 MG tablet Take 50 mg by mouth daily. 02/04/22   [provider]  vortioxetine HBr (TRINTELLIX) 10 MG TABS tablet Take 10 mg by mouth daily.    [provider]    Current Outpatient Medications  Medication Sig Dispense Refill   ALPRAZolam (XANAX) 1 MG tablet SMARTSIG:0.5 Tablet(s) By Mouth 1-2 Times Daily     AMBULATORY NON FORMULARY MEDICATION 8.608 mg by Intrathecal Infusion route daily. Morphine Pump Implant     atorvastatin (LIPITOR) 10 MG tablet TAKE 1 TABLET BY MOUTH DAILY 90 tablet 2   buPROPion (WELLBUTRIN XL) 150 MG 24 hr tablet Take 150 mg by mouth every morning.     busPIRone (BUSPAR) 10 MG tablet Take 1 tablet (10 mg total) by mouth 2 (two) times daily.     CAPLYTA 42 MG capsule Take 42 mg by mouth daily.     Cholecalciferol (VITAMIN D) 50 MCG (2000 UT) CAPS Take 1 capsule by mouth daily.     Erenumab-aooe (AIMOVIG) 140 MG/ML SOAJ Inject 140 mg into the skin every 30 (thirty) days. 1 mL 5   hydrOXYzine (ATARAX) 50 MG tablet Take 1 tablet (50 mg total) by mouth in the morning and at bedtime. BID 180 tablet 0   lamoTRIgine (LAMICTAL) 100 MG tablet Take 100 mg by mouth 2 (two) times daily.     levothyroxine  (SYNTHROID) 75 MCG tablet TAKE 1 TABLET BY MOUTH DAILY  BEFORE BREAKFAST 90 tablet 3   NEEDLE, DISP, 18 G (BD SAFETYGLIDE NEEDLE) 18G X 1-1/2" MISC Draw up 50 each 0   NUCYNTA 100 MG TABS Take 1 tablet by mouth every 6 (six) hours as needed.     Omega-3 1000 MG CAPS Take 2 each by mouth daily.     OVER THE COUNTER  MEDICATION Take 1 Piece by mouth daily. CBD gummy (contains small amount of THC)     polyethylene glycol powder (GLYCOLAX/MIRALAX) 17 GM/SCOOP powder Take 17 g by mouth every other day as needed.     promethazine (PHENERGAN) 25 MG tablet TAKE 1/2 TO 1 TABLET BY MOUTH EVERY 8 HOURS AS NEEDED FOR NAUSEA AND VOMITING 20 tablet 1   sulfamethoxazole-trimethoprim (BACTRIM DS) 800-160 MG tablet Take 1 tablet by mouth 2 (two) times daily for 14 days. 28 tablet 0   SUMAtriptan 6 MG/0.5ML SOAJ INJECT 6 MG INTO THE SKIN DAILY AS NEEDED (WITH 2ND DOSE 2 HOURS AFTER 1ST DOSE IF NEEDED.). 5 mL 3   SYRINGE-NEEDLE, DISP, 3 ML (B-D 3CC LUER-LOK SYR 21GX1-1/2) 21G X 1-1/2" 3 ML MISC Use this needle to injection 50 each 0   tadalafil (CIALIS) 5 MG tablet TAKE 1 TABLET BY MOUTH DAILY AS  NEEDED 90 tablet 3   tamsulosin (FLOMAX) 0.4 MG CAPS capsule Take 1 capsule (0.4 mg total) by mouth daily after breakfast. 30 capsule 3   testosterone cypionate (DEPOTESTOSTERONE CYPIONATE) 200 MG/ML injection INJECT 0.75 ML INTO THE MUSCLE EVERY 14 DAYS 2 mL 0   tiZANidine (ZANAFLEX) 4 MG tablet Take 4 mg by mouth as needed.     topiramate (TOPAMAX) 50 MG tablet Take 50 mg by mouth daily.     vortioxetine HBr (TRINTELLIX) 10 MG TABS tablet Take 10 mg by mouth daily.     No current facility-administered medications for this visit.    Allergies as of 09/09/2022 - Review Complete 09/09/2022  Allergen Reaction Noted   Neurontin [gabapentin]  03/19/2011   Vancomycin     Doxycycline  03/14/2021   Lithium  03/07/2018   Mirtazapine      Family History  Problem Relation Age of Onset   Ulcers Mother    Depression Mother     Thyroid disease Mother        hypo   Stroke Father 69   Thyroid disease Father        hypo   Esophageal cancer Maternal Aunt    Lung cancer Maternal Grandmother    Thyroid disease Maternal Grandmother        hypo   Colon cancer Maternal Grandfather    Heart attack Maternal Grandfather 55   Esophageal cancer Maternal Grandfather    Diabetes Neg Hx    Prostate cancer Neg Hx    Kidney cancer Neg Hx    Bladder Cancer Neg Hx    Migraines Neg Hx    Headache Neg Hx    Colon polyps Neg Hx    Rectal cancer Neg Hx    Stomach cancer Neg Hx     Social History   Socioeconomic History   Marital status: Married    Spouse name: Water quality scientist   Number of children: 0   Years of education: Not on file   Highest education level: Not on file  Occupational History   Occupation: Disabled  Tobacco Use   Smoking status: Former    Packs/day: 0.25    Types: E-cigarettes, Cigarettes    Quit date: 08/10/2009    Years since quitting: 13.0    Passive exposure: Never   Smokeless tobacco: Never   Tobacco comments:    smoked age intermittently age [age]; up to 2 cigarettes/ day   Vaping Use   Vaping Use: Every day   Substances: Nicotine, Flavoring  Substance and Sexual Activity   Alcohol use: No   Drug use: No  Sexual activity: Not on file  Other Topics Concern   Not on file  Social History Narrative   From [place].     [address] Grad   To [place] 2000   Disability from bipolar affective disorder, prev accounting.     Married 1997   No kids   Enjoys walking, travel.     Social Determinants of Health   Financial Resource Strain: Low Risk  (03/03/2022)   Overall Financial Resource Strain (CARDIA)    Difficulty of Paying Living Expenses: Not hard at all  Food Insecurity: No Food Insecurity (03/03/2022)   Hunger Vital Sign    Worried About Running Out of Food in the Last Year: Never true    Ran Out of Food in the Last Year: Never true  Transportation Needs: No Transportation Needs  (03/03/2022)   PRAPARE - Hydrologist (Medical): No    Lack of Transportation (Non-Medical): No  Physical Activity: Insufficiently Active (03/03/2022)   Exercise Vital Sign    Days of Exercise per Week: 2 days    Minutes of Exercise per Session: 20 min  Stress: No Stress Concern Present (03/03/2022)   [place]    Feeling of Stress : Only a little  Social Connections: Socially Isolated (03/03/2022)   Social Connection and Isolation Panel [NHANES]    Frequency of Communication with Friends and Family: Once a week    Frequency of Social Gatherings with Friends and Family: Once a week    Attends Religious Services: Never    Marine scientist or Organizations: No    Attends Archivist Meetings: Never    Marital Status: Married  Human resources officer Violence: Not At Risk (03/03/2022)   Humiliation, Afraid, Rape, and Kick questionnaire    Fear of Current or Ex-Partner: No    Emotionally Abused: No    Physically Abused: No    Sexually Abused: No    Review of Systems:  All other review of systems negative except as mentioned in the HPI.  Physical Exam: Vital signs BP 117/85   Pulse 98   Temp 99 F (37.2 C)   Ht 5' 5.5" (1.664 m)   Wt 190 lb (86.2 kg)   SpO2 94%   BMI 31.14 kg/m   General:   Alert,  Well-developed, well-nourished, pleasant and cooperative in NAD Lungs:  Clear throughout to auscultation.   Heart:  Regular rate and rhythm; no murmurs, clicks, rubs,  or gallops. Abdomen:  Soft, nontender and nondistended. Normal bowel sounds.   Neuro/Psych:  Alert and cooperative. Normal mood and affect. A and O x 3   @Tisa Weisel  Simonne Maffucci, MD, [facility] [phone] (pager) 09/09/2022 10:42 AM@

## 2022-09-09 NOTE — Op Note (Signed)
[place] Patient Name: Cody [name] Procedure Date: 09/09/2022 10:31 AM MRN: [id number] Endoscopist: Gatha Mayer , MD, [id number] Age: 52 Referring MD:  Date of Birth: [birth year] Gender: Male Account #: [id number] Procedure:                Colonoscopy Indications:              Screening for colorectal malignant neoplasm, Last                            colonoscopy: 2013 Medicines:                Monitored Anesthesia Care Procedure:                Pre-Anesthesia Assessment:                           - Prior to the procedure, a History and Physical                            was performed, and patient medications and                            allergies were reviewed. The patient's tolerance of                            previous anesthesia was also reviewed. The risks                            and benefits of the procedure and the sedation                            options and risks were discussed with the patient.                            All questions were answered, and informed consent                            was obtained. Prior Anticoagulants: The patient has                            taken no anticoagulant or antiplatelet agents. ASA                            Grade Assessment: III - A patient with severe                            systemic disease. After reviewing the risks and                            benefits, the patient was deemed in satisfactory                            condition to undergo the procedure.  After obtaining informed consent, the colonoscope                            was passed under direct vision. Throughout the                            procedure, the patient's blood pressure, pulse, and                            oxygen saturations were monitored continuously. The                            CF [id number] #[id number] was introduced through the anus                            and advanced to the the cecum,  identified by                            appendiceal orifice and ileocecal valve. The                            colonoscopy was performed without difficulty. The                            patient tolerated the procedure well. The quality                            of the bowel preparation was adequate. The                            ileocecal valve, appendiceal orifice, and rectum                            were photographed. The bowel preparation used was                            SUPREP via split dose instruction. Scope In: 10:59:59 AM Scope Out: 11:20:34 AM Scope Withdrawal Time: 0 hours 12 minutes 34 seconds  Total Procedure Duration: 0 hours 20 minutes 35 seconds  Findings:                 The perianal and digital rectal examinations were                            normal. Pertinent negatives include normal prostate                            (size, shape, and consistency).                           The entire examined colon appeared normal on direct                            and retroflexion views. Complications:  No immediate complications. Estimated Blood Loss:     Estimated blood loss: none. Impression:               - The entire examined colon is normal on direct and                            retroflexion views.                           - No specimens collected. Recommendation:           - Patient has a contact number available for                            emergencies. The signs and symptoms of potential                            delayed complications were discussed with the                            patient. Return to normal activities tomorrow.                            Written discharge instructions were provided to the                            patient.                           - Resume previous diet.                           - Continue present medications.                           - Repeat colonoscopy in 10 years for screening                             purposes. Gatha Mayer, MD 09/09/2022 11:26:47 AM This report has been signed electronically.

## 2022-09-10 ENCOUNTER — Telehealth: Payer: Self-pay

## 2022-09-10 NOTE — Telephone Encounter (Signed)
  Follow up Call-     09/09/2022   10:26 AM  Call back number  Post procedure Call Back phone  # [phone] [place] PHONE IS WHO PT REQUEST F/U CALL TO  Permission to leave phone message Yes     Patient questions:  Do you have a fever, pain , or abdominal swelling? No. Pain Score  0 *  Have you tolerated food without any problems? Yes.    Have you been able to return to your normal activities? Yes.    Do you have any questions about your discharge instructions: Diet   No. Medications  No. Follow up visit  No.  Do you have questions or concerns about your Care? No.  Actions: * If pain score is 4 or above: No action needed, pain <4.

## 2022-09-11 DIAGNOSIS — F411 Generalized anxiety disorder: Secondary | ICD-10-CM | POA: Diagnosis not present

## 2022-09-11 DIAGNOSIS — F603 Borderline personality disorder: Secondary | ICD-10-CM | POA: Diagnosis not present

## 2022-09-11 DIAGNOSIS — F332 Major depressive disorder, recurrent severe without psychotic features: Secondary | ICD-10-CM | POA: Diagnosis not present

## 2022-09-11 DIAGNOSIS — F410 Panic disorder [episodic paroxysmal anxiety] without agoraphobia: Secondary | ICD-10-CM | POA: Diagnosis not present

## 2022-09-14 ENCOUNTER — Other Ambulatory Visit: Payer: Self-pay | Admitting: *Deleted

## 2022-09-16 ENCOUNTER — Other Ambulatory Visit: Payer: Self-pay | Admitting: Family Medicine

## 2022-09-16 MED ORDER — TAMSULOSIN HCL 0.4 MG PO CAPS: 0.4000 mg | ORAL_CAPSULE | Freq: Every day | ORAL | 1 refills | Status: DC

## 2022-09-17 ENCOUNTER — Other Ambulatory Visit: Payer: Self-pay | Admitting: *Deleted

## 2022-09-17 ENCOUNTER — Encounter: Payer: Self-pay | Admitting: Family Medicine

## 2022-09-17 DIAGNOSIS — G43709 Chronic migraine without aura, not intractable, without status migrainosus: Secondary | ICD-10-CM

## 2022-09-17 MED ORDER — AIMOVIG 140 MG/ML ~~LOC~~ SOAJ: 140.0000 mg | SUBCUTANEOUS | 0 refills | Status: DC

## 2022-09-18 ENCOUNTER — Other Ambulatory Visit: Payer: Self-pay | Admitting: *Deleted

## 2022-09-18 DIAGNOSIS — R350 Frequency of micturition: Secondary | ICD-10-CM

## 2022-09-21 ENCOUNTER — Other Ambulatory Visit: Payer: Self-pay

## 2022-09-21 ENCOUNTER — Other Ambulatory Visit: Payer: Medicare Other

## 2022-09-21 DIAGNOSIS — R8290 Unspecified abnormal findings in urine: Secondary | ICD-10-CM

## 2022-09-21 LAB — URINALYSIS, COMPLETE
Bilirubin, UA: NEGATIVE
Glucose, UA: NEGATIVE
Ketones, UA: NEGATIVE
Leukocytes,UA: NEGATIVE
Nitrite, UA: NEGATIVE
Protein,UA: NEGATIVE
RBC, UA: NEGATIVE
Specific Gravity, UA: 1.01 (ref 1.005–1.030)
Urobilinogen, Ur: 0.2 mg/dL (ref 0.2–1.0)
pH, UA: 6.5 (ref 5.0–7.5)

## 2022-09-21 LAB — MICROSCOPIC EXAMINATION

## 2022-09-22 ENCOUNTER — Encounter: Payer: Self-pay | Admitting: *Deleted

## 2022-09-23 ENCOUNTER — Other Ambulatory Visit: Payer: Self-pay | Admitting: Urology

## 2022-09-23 DIAGNOSIS — E349 Endocrine disorder, unspecified: Secondary | ICD-10-CM

## 2022-09-26 ENCOUNTER — Other Ambulatory Visit: Payer: Self-pay | Admitting: Family Medicine

## 2022-09-29 ENCOUNTER — Telehealth: Payer: Self-pay | Admitting: Family Medicine

## 2022-09-29 ENCOUNTER — Other Ambulatory Visit: Payer: Self-pay | Admitting: Family Medicine

## 2022-09-29 NOTE — Telephone Encounter (Signed)
error 

## 2022-10-01 ENCOUNTER — Other Ambulatory Visit: Payer: BC Managed Care – PPO

## 2022-10-02 ENCOUNTER — Other Ambulatory Visit (INDEPENDENT_AMBULATORY_CARE_PROVIDER_SITE_OTHER): Payer: Self-pay

## 2022-10-02 DIAGNOSIS — E038 Other specified hypothyroidism: Secondary | ICD-10-CM

## 2022-10-02 DIAGNOSIS — R7989 Other specified abnormal findings of blood chemistry: Secondary | ICD-10-CM

## 2022-10-02 NOTE — Addendum Note (Signed)
Addended by: Pilar Grammes on: 10/02/2022 02:27 PM   Modules accepted: Orders

## 2022-10-02 NOTE — Addendum Note (Signed)
Addended by: Pilar Grammes on: 10/02/2022 02:28 PM   Modules accepted: Orders

## 2022-10-03 LAB — BASIC METABOLIC PANEL
BUN/Creatinine Ratio: 11 (ref 9–20)
BUN: 15 mg/dL (ref 6–24)
CO2: 22 mmol/L (ref 20–29)
Calcium: 9.2 mg/dL (ref 8.7–10.2)
Chloride: 104 mmol/L (ref 96–106)
Creatinine, Ser: 1.38 mg/dL — ABNORMAL HIGH (ref 0.76–1.27)
Glucose: 128 mg/dL — ABNORMAL HIGH (ref 70–99)
Potassium: 3.9 mmol/L (ref 3.5–5.2)
Sodium: 146 mmol/L — ABNORMAL HIGH (ref 134–144)
eGFR: 62 mL/min/{1.73_m2} (ref >59)

## 2022-10-03 LAB — TSH: TSH: 2.07 u[IU]/mL (ref 0.450–4.500)

## 2022-10-07 ENCOUNTER — Ambulatory Visit: Payer: Medicare Other | Admitting: Family Medicine

## 2022-10-12 DIAGNOSIS — F411 Generalized anxiety disorder: Secondary | ICD-10-CM | POA: Diagnosis not present

## 2022-10-12 DIAGNOSIS — F4312 Post-traumatic stress disorder, chronic: Secondary | ICD-10-CM | POA: Diagnosis not present

## 2022-10-12 DIAGNOSIS — F410 Panic disorder [episodic paroxysmal anxiety] without agoraphobia: Secondary | ICD-10-CM | POA: Diagnosis not present

## 2022-10-12 DIAGNOSIS — F603 Borderline personality disorder: Secondary | ICD-10-CM | POA: Diagnosis not present

## 2022-10-16 DIAGNOSIS — Z978 Presence of other specified devices: Secondary | ICD-10-CM | POA: Diagnosis not present

## 2022-10-16 DIAGNOSIS — Z79899 Other long term (current) drug therapy: Secondary | ICD-10-CM | POA: Diagnosis not present

## 2022-10-16 DIAGNOSIS — M545 Low back pain, unspecified: Secondary | ICD-10-CM | POA: Diagnosis not present

## 2022-10-16 DIAGNOSIS — M533 Sacrococcygeal disorders, not elsewhere classified: Secondary | ICD-10-CM | POA: Diagnosis not present

## 2022-10-16 DIAGNOSIS — Z5181 Encounter for therapeutic drug level monitoring: Secondary | ICD-10-CM | POA: Diagnosis not present

## 2022-10-16 DIAGNOSIS — M5136 Other intervertebral disc degeneration, lumbar region: Secondary | ICD-10-CM | POA: Diagnosis not present

## 2022-10-16 DIAGNOSIS — M47816 Spondylosis without myelopathy or radiculopathy, lumbar region: Secondary | ICD-10-CM | POA: Diagnosis not present

## 2022-10-19 ENCOUNTER — Encounter: Payer: Self-pay | Admitting: Family Medicine

## 2022-10-19 ENCOUNTER — Other Ambulatory Visit: Payer: Self-pay | Admitting: Family Medicine

## 2022-10-19 DIAGNOSIS — E349 Endocrine disorder, unspecified: Secondary | ICD-10-CM

## 2022-10-19 MED ORDER — TESTOSTERONE CYPIONATE 200 MG/ML IM SOLN: INTRAMUSCULAR | 0 refills | Status: DC

## 2022-10-20 NOTE — Progress Notes (Signed)
Chief Complaint  Patient presents with   Room 1    Pt is here Alone. Pt states that his monthly shot hasn't worked. Pt states that he has cluster headaches that last 2 days. Pt states that he has nausea and vomiting with his migraines. Pt states that his migraines never goes away until 3 days later. Pt states that's he has lost weight due to his migraines and he's not able to hold anything down.     HISTORY OF PRESENT ILLNESS:  10/22/22 ALL:  Cody [name] returns for follow up for migraines. He was last seen 09/2021 and doing well on Emgality every 30 days. We had to switch him to Amovig 03/2022 due to insurance. PCP continues sumatriptan injections and phenergan PRN. He reports headaches are significantly worse. He is having nearly daily headaches. He is taking sumatriptan injections regularly. He has filled both autoinjector and vials. He reports PCP gave him 100mg  tablets that he is taking as well. He reports significant anxiety.   Topiramate added 02/2022 by psychiatry for PTSD. He is followed by every 4-6 weeks. He is not seeing psychology right now but does feel he needs to see a therapist. He continues Trintellix, Wellbutrin, Buspar and lamotrigine. He is also seeing pain management and recently started on Nucynta.   OSA diagnosed in 2021 but he could not tolerate CPAP therapy. He lost about 50lbs since study. He does not wish to repeat HST.   10/02/2021 ALL: Cody [name] returns for follow up for migraines. We switched him from Ajovy to [facility] in 05/2021. Since, he reports headaches have reduced by about 50%. He is very pleased with response. He does report injections sting a little but are tolerable. He is not having to use Imitrex.   He is not using CPAP. Two day HST showed AHI 26/hr with nadir of 84%. He was advised to start CPAP but reports difficulty with DME and he did not wish to continue. He has lost about 50 pounds. He decreased caloric intake. He feels that appetite suppression started due to  nausea with headaches. Weight has been stable for the past 3-4 months. He continues to see psychiatry and pain management. He is getting ketamine treatments PRN and feels that this is helping significantly with depression and pain.   05/29/2021 ALL:  Cody [name] is a 52 y.o. male here today for follow up for migraines. He was started on Ajovy following consult with Dr Lucia Gaskins 11/2020. MRI was normal. He feels that some months are better than others. He has tolerated Ajovy but not sure it working as well as he would like. Last injection Migraines are always at night. He has about 7-8 per month. He usually vomits with migraines. He is followed by Dr Roderic Ovens with [facility]. Dr Para March follows for PCP needs. He is seen by psychiatry, unsure of name as he was recently switched to new provider. He is considering ketamine treatments. Sumatriptan and phenergan prescribed by PCP helps with abortive therapy.    HISTORY (copied from Dr Trevor Mace previous note)  HPI:  Cody [name] is a 52 y.o. male here as requested by Joaquim Nam, MD for headache. PMHx already evaluated by neurology for headache and severe sleep apnea Dr. [facility] 2021, MRI and MRV ordered, PTSD, hypothyroidism, diagnosed with nonepileptic spells by Dr. Margaretmary Bayley, diagnosed with severe sleep apnea by Dr. Sherryll Burger May 2021 a CPAP machine was ordered for him, chronic pain on a morphine pump, hyperlipidemia, depression,  degenerative disc disease, chronic back pain, bipolar 2, anxiety, cigarette smoker, obesity, chronic pain, headache.  I reviewed Dr. Dwana Curd notes: He seen on August 30, 2020 complaining of headache for about a week, at the base of the skull, radiates over the right more than left temple, right side of the head, he vomited from pain in the prior 24 hours, use cold compress, later in bed in a dark room, he was able to sleep some and the pain got better, but the headache continued frontal right side, no known fever, stiff  neck no speech or motor changes.  In April 2021 he had a normal MRI of the brain.  Per Dr. Dwana Curd evaluation, concerning for migraine, he was prescribed Imitrex and Phenergan as needed and referred to neurology.   Patient has been seen by neurology in the past, in January 2021 he was seen at the [facility] by Dr. Sherryll Burger, for episodic confusion, more so in the morning with associated disequilibrium, history of bipolar disorder and substance abuse, chronic pain on morphine pump with some episodes of seizure-like activity likely nonepileptic spells.  Dr. Sherryll Burger recommended EEG, home sleep study, neurocognitive testing, head CT October 2020 was unremarkable.  They ordered home sleep test, speech therapy for cognitive training, neuropsychology for neurocognitive testing, they checked B12, B1, folate, RPR, Lyme, ACE, ESR, CRP, ANA, IFE, rheumatoid arthritis factor.   Patient is here alone and reports: The first headache was in January of this year, "out of the blue", 2-3 times a week, 4-7/10 in pain, Imitrex helps and sp does promethazine. He has nausea and vomiting, worst headache of life so severe he was terrified, they are on the right side and on the top, pulsating and pounding and throbbing, excruciating, he may see sparkles in the eyes he also gets pain in the back of the neck and pressure, he takes imitrext at that point. The headaches can last 24-48 hours, with treatment under a day, photophobia/phonophobia, since January 1/2 the month of migraines, he is worried, he can't drive with the migraines, no family history, acute onset, no medication overuse, does not always have an aura. He is trying to examine his lifestyle, stress, sleep and nothing has been discovered, they come after 12, black out helps, right eye water, not stabbing, he has pressure behind his eye.  He has numbness in the right side of the scalp and face with the headaches, but no focal weakness. No other focal neurologic deficits,  associated symptoms, inciting events or modifiable factors.   Reviewed notes, labs and imaging from outside physicians, which showed:   CMP with elevated glucose 06/13/2020 otherwise unremarkable   Celexa, lexapro, lithium, prednisone, propranolol, seroquel, imitrex, lamictal, amitriptyline, topamax with lamictal contraindicated   MRI brain 11/23/2019: FINDINGS: Brain: There is no acute infarction or intracranial hemorrhage. There is no intracranial mass, mass effect, or edema. There is no hydrocephalus or extra-axial fluid collection. Ventricles and sulci are normal in size and configuration. No abnormal enhancement.   Vascular: Major vessel flow voids at the skull base are preserved.   Skull and upper cervical spine: Normal marrow signal is preserved.   Sinuses/Orbits: Paranasal sinuses are aerated. Orbits are unremarkable.   Other: Sella is unremarkable.  Mastoid air cells are clear.   IMPRESSION: Normal MRI of the brain.   REVIEW OF SYSTEMS: Out of a complete 14 system review of symptoms, the patient complains only of the following symptoms, headaches, chronic pain, anxiety, depression and all other reviewed systems are negative.  ALLERGIES: Allergies  Allergen Reactions   Neurontin [Gabapentin]     lethargic   Vancomycin     Swelling    Doxycycline     vomiting   Lithium     tremor   Mirtazapine     REACTION: lethargy ( Remeron)     HOME MEDICATIONS: Outpatient Medications Prior to Visit  Medication Sig Dispense Refill   AMBULATORY NON FORMULARY MEDICATION 8.608 mg by Intrathecal Infusion route daily. Morphine Pump Implant     atorvastatin (LIPITOR) 10 MG tablet TAKE 1 TABLET BY MOUTH DAILY 90 tablet 2   buPROPion (WELLBUTRIN XL) 150 MG 24 hr tablet Take 150 mg by mouth every morning.     busPIRone (BUSPAR) 10 MG tablet Take 1 tablet (10 mg total) by mouth 2 (two) times daily.     CAPLYTA 42 MG capsule Take 42 mg by mouth daily.     Cholecalciferol (VITAMIN  D) 50 MCG (2000 UT) CAPS Take 1 capsule by mouth daily.     hydrOXYzine (ATARAX) 50 MG tablet Take 1 tablet (50 mg total) by mouth in the morning and at bedtime. BID 180 tablet 0   lamoTRIgine (LAMICTAL) 100 MG tablet Take 100 mg by mouth 2 (two) times daily.     levothyroxine (SYNTHROID) 75 MCG tablet TAKE 1 TABLET BY MOUTH DAILY  BEFORE BREAKFAST 90 tablet 3   NEEDLE, DISP, 18 G (BD SAFETYGLIDE NEEDLE) 18G X 1-1/2" MISC Draw up 50 each 0   NUCYNTA 100 MG TABS Take 1 tablet by mouth every 6 (six) hours as needed.     Omega-3 1000 MG CAPS Take 2 each by mouth daily.     OVER THE COUNTER MEDICATION Take 1 Piece by mouth daily. CBD gummy (contains small amount of THC)     polyethylene glycol powder (GLYCOLAX/MIRALAX) 17 GM/SCOOP powder Take 17 g by mouth every other day as needed.     promethazine (PHENERGAN) 25 MG tablet TAKE 1/2 TO 1 TABLET BY MOUTH EVERY 8 HOURS AS NEEDED FOR NAUSEA AND VOMITING 20 tablet 1   SYRINGE-NEEDLE, DISP, 3 ML (B-D 3CC LUER-LOK SYR 21GX1-1/2) 21G X 1-1/2" 3 ML MISC Use this needle to injection 50 each 0   tamsulosin (FLOMAX) 0.4 MG CAPS capsule Take 1 capsule (0.4 mg total) by mouth daily after breakfast. 90 capsule 1   testosterone cypionate (DEPOTESTOSTERONE CYPIONATE) 200 MG/ML injection INJECT 0.75 ML INTO THE MUSCLE EVERY 14 DAYS 2 mL 0   tiZANidine (ZANAFLEX) 4 MG tablet Take 4 mg by mouth as needed.     topiramate (TOPAMAX) 50 MG tablet Take 50 mg by mouth daily.     vortioxetine HBr (TRINTELLIX) 10 MG TABS tablet Take 10 mg by mouth daily.     Erenumab-aooe (AIMOVIG) 140 MG/ML SOAJ Inject 140 mg into the skin every 30 (thirty) days. 1 mL 0   SUMAtriptan (IMITREX) 6 MG/0.5ML SOLN injection INJECT 0.5 MLS (6 MG TOTAL) INTO THE SKIN DAILY AS NEEDED FOR MIGRAINE OR HEADACHE. MAY REPEAT IN 2 HOURS IF HEADACHE PERSISTS OR RECURS. 2.5 mL 2   SUMAtriptan 6 MG/0.5ML SOAJ INJECT 6 MG INTO THE SKIN DAILY AS NEEDED (WITH 2ND DOSE 2 HOURS AFTER 1ST DOSE IF NEEDED.). 5 mL 3    ALPRAZolam (XANAX) 1 MG tablet SMARTSIG:0.5 Tablet(s) By Mouth 1-2 Times Daily (Patient not taking: Reported on 10/22/2022)     tadalafil (CIALIS) 5 MG tablet TAKE 1 TABLET BY MOUTH DAILY AS  NEEDED 90 tablet 3   No  facility-administered medications prior to visit.     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; herniated disc L5- S1   Degenerative disk disease    Depression    Fracture, mandible (HCC)    Fracture, tibia    Headache    Hyperlipidemia    Hypertension    resolved   Hypothyroidism 05/2007   Dr Marga Melnick (retired)/ Dr Crawford Givens   Neuromuscular disorder [facility])    PTSD (post-traumatic stress disorder)    Suicidal behavior    a.) ideations with attempt x 1     PAST SURGICAL HISTORY: Past Surgical History:  Procedure Laterality Date   APPENDECTOMY     peritonitis   BACK SURGERY     EVALUATION UNDER ANESTHESIA WITH HEMORRHOIDECTOMY N/A 01/28/2022   Procedure: EXAM UNDER ANESTHESIA WITH HEMORRHOIDECTOMY;  Surgeon: Campbell Lerner, MD;  Location: [facility] ORS;  Service: General;  Laterality: N/A;   FRACTURE SURGERY     INTRATHECAL PUMP IMPLANTATION  2007   LAMINECTOMY  2004   arterial injury, transfused 7 units pc, implant surgery    MANDIBLE FRACTURE SURGERY     mugged   THORACIC DISCECTOMY     T10   UPPER GASTROINTESTINAL ENDOSCOPY  2006   gastritis     FAMILY HISTORY: Family History  Problem Relation Age of Onset   Ulcers Mother    Depression Mother    Thyroid disease Mother        hypo   Stroke Father 94   Thyroid disease Father        hypo   Esophageal cancer Maternal Aunt    Lung cancer Maternal Grandmother    Thyroid disease  Maternal Grandmother        hypo   Colon cancer Maternal Grandfather    Heart attack Maternal Grandfather 55   Esophageal cancer Maternal Grandfather    Diabetes Neg Hx    Prostate cancer Neg Hx    Kidney cancer Neg Hx    Bladder Cancer Neg Hx    Migraines Neg Hx    Headache Neg Hx    Colon polyps Neg Hx    Rectal cancer Neg Hx    Stomach cancer Neg Hx      SOCIAL HISTORY: Social History   Socioeconomic History   Marital status: Married    Spouse name: Research scientist (physical sciences)   Number of children: 0   Years of education: Not on file   Highest education level: Not on file  Occupational History   Occupation: Disabled  Tobacco Use   Smoking status: Former    Packs/day: .25    Types: E-cigarettes, Cigarettes    Quit date: 08/10/2009    Years since quitting: 13.2    Passive exposure: Never   Smokeless tobacco: Never   Tobacco comments:    smoked age intermittently age [age]; up to 2 cigarettes/ day   Vaping Use   Vaping Use: Every day   Substances: Nicotine, Flavoring  Substance and Sexual Activity   Alcohol use: No   Drug use: No   Sexual activity: Not on file  Other Topics Concern  Not on file  Social History Narrative   From [place].     [address] Grad   To [place] 2000   Disability from bipolar affective disorder, prev accounting.     Married 1997   No kids   Enjoys walking, travel.     Social Determinants of Health   Financial Resource Strain: Low Risk  (03/03/2022)   Overall Financial Resource Strain (CARDIA)    Difficulty of Paying Living Expenses: Not hard at all  Food Insecurity: No Food Insecurity (03/03/2022)   Hunger Vital Sign    Worried About Running Out of Food in the Last Year: Never true    Ran Out of Food in the Last Year: Never true  Transportation Needs: No Transportation Needs (03/03/2022)   PRAPARE - Administrator, Civil Service (Medical): No    Lack of Transportation (Non-Medical): No  Physical Activity: Insufficiently Active  (03/03/2022)   Exercise Vital Sign    Days of Exercise per Week: 2 days    Minutes of Exercise per Session: 20 min  Stress: No Stress Concern Present (03/03/2022)   [employer] of Occupational Health - Occupational Stress Questionnaire    Feeling of Stress : Only a little  Social Connections: Socially Isolated (03/03/2022)   Social Connection and Isolation Panel [NHANES]    Frequency of Communication with Friends and Family: Once a week    Frequency of Social Gatherings with Friends and Family: Once a week    Attends Religious Services: Never    Database administrator or Organizations: No    Attends Banker Meetings: Never    Marital Status: Married  Catering manager Violence: Not At Risk (03/03/2022)   Humiliation, Afraid, Rape, and Kick questionnaire    Fear of Current or Ex-Partner: No    Emotionally Abused: No    Physically Abused: No    Sexually Abused: No     PHYSICAL EXAM  Vitals:   10/22/22 1258  BP: (!) 175/112  Pulse: (!) 106  Weight: 191 lb (86.6 kg)  Height: 5\' 5"  (1.651 m)     Body mass index is 31.78 kg/m.  Generalized: Well developed, in no acute distress  Cardiology: normal rate and rhythm, no murmur auscultated  Respiratory: clear to auscultation bilaterally    Neurological examination  Mentation: Alert oriented to time, place, history taking. Follows all commands speech and language fluent Cranial nerve II-XII: Pupils were equal round reactive to light. Extraocular movements were full, visual field were full on confrontational test. Facial sensation and strength were normal. Head turning and shoulder shrug  were normal and symmetric. Motor: The motor testing reveals 5 over 5 strength of all 4 extremities. Good symmetric motor tone is noted throughout.  Gait and station: Gait is normal.    DIAGNOSTIC DATA (LABS, IMAGING, TESTING) - I reviewed patient records, labs, notes, testing and imaging myself where available.  Lab Results   Component Value Date   WBC 8.2 03/03/2022   HGB 17.4 08/13/2022   HCT 50.9 08/13/2022   MCV 89 03/03/2022   PLT 188 03/03/2022      Component Value Date/Time   NA 146 (H) 10/02/2022 1431   NA 142 03/23/2014 1752   K 3.9 10/02/2022 1431   K 3.9 03/23/2014 1752   CL 104 10/02/2022 1431   CL 103 03/23/2014 1752   CO2 22 10/02/2022 1431   CO2 28 03/23/2014 1752   GLUCOSE 128 (H) 10/02/2022 1431   GLUCOSE 130 (H)  02/11/2022 0412   GLUCOSE 166 (H) 03/23/2014 1752   BUN 15 10/02/2022 1431   BUN 14 03/23/2014 1752   CREATININE 1.38 (H) 10/02/2022 1431   CREATININE 1.14 12/09/2016 0000   CREATININE 1.33 (H) 03/23/2014 1752   CALCIUM 9.2 10/02/2022 1431   CALCIUM 9.2 03/23/2014 1752   PROT 7.2 03/03/2022 0823   PROT 8.3 (H) 03/23/2014 1752   ALBUMIN 4.8 03/03/2022 0823   ALBUMIN 4.2 03/23/2014 1752   AST 22 03/03/2022 0823   AST 22 12/09/2016 0000   ALT 21 03/03/2022 0823   ALT 22 12/09/2016 0000   ALT 84 (H) 03/23/2014 1752   ALKPHOS 94 03/03/2022 0823   ALKPHOS 76 03/23/2014 1752   BILITOT 0.3 03/03/2022 0823   BILITOT 0.5 03/23/2014 1752   GFRNONAA >60 02/11/2022 0412   GFRNONAA >60 03/23/2014 1752   GFRAA 117 06/13/2020 1347   GFRAA >60 03/23/2014 1752   Lab Results  Component Value Date   CHOL 153 03/03/2022   HDL 26 (L) 03/03/2022   LDLCALC 98 03/03/2022   LDLDIRECT 176.4 10/11/2012   TRIG 165 (H) 03/03/2022   CHOLHDL 5.9 (H) 03/03/2022   Lab Results  Component Value Date   HGBA1C 5.6 07/23/2021   No results found for: "VITAMINB12" Lab Results  Component Value Date   TSH 2.070 10/02/2022       02/28/2018   12:15 PM  MMSE - Mini Mental State Exam  Orientation to time 5  Orientation to Place 5  Registration 3  Attention/ Calculation 0  Recall 3  Language- name 2 objects 0  Language- repeat 1  Language- follow 3 step command 3  Language- read & follow direction 0  Write a sentence 0  Copy design 0  Total score 20         No data to display            ASSESSMENT AND PLAN  52 y.o. year old male  has a past medical history of Anxiety, Bipolar 2 disorder (HCC) (03/2009), Chronic back pain, Chronic pain syndrome, Complication of anesthesia, DDD (degenerative disc disease), Degenerative disk disease, Depression, Fracture, mandible (HCC), Fracture, tibia, Headache, Hyperlipidemia, Hypertension, Hypothyroidism (05/2007), Neuromuscular disorder (HCC), PTSD (post-traumatic stress disorder), and Suicidal behavior. here with    Chronic migraine without aura without status migrainosus, not intractable  History of obstructive sleep apnea  Cody [name] has tolerated Emgality and reports at least 50% improvement in migraine intensity and frequency. Amovig has not been helpful at all. I will have him switch back to [facility] pending coverage. May consider Qulipta and or Botox in future. He will continue sumatriptan and phenergan as prescribed by PCP. I have advised proper dosing and encouraged him not to exceed more than 2 doses in 24 hours or 10 doses per month. He was advised to keep aclose eye on his BP. He is extremely anxious in the office, today. He will continue close follow up with psychiatry and I have encouraged him to consider a counselor. He was unable to tolerate CPAP. He has lost 50 pounds. I have encouraged him to consider repeat testing. He will let me know if he decides to pursue. He will continue close follow up with PCP, pain management and psychiatry. Follow up with me in 1 year.    No orders of the defined types were placed in this encounter.     Meds ordered this encounter  Medications   Galcanezumab-gnlm (EMGALITY) 120 MG/ML SOAJ    Sig: Inject 120  mg into the skin every 30 (thirty) days.    Dispense:  3 mL    Refill:  3    Order Specific Question:   Supervising Provider    Answer:   Anson Fret [id number]   SUMAtriptan 6 MG/0.5ML SOAJ    Sig: Inject 6 mg into the skin daily as needed (with 2nd dose 2 hours after 1st  dose if needed.).    Dispense:  5 mL    Refill:  5    Order Specific Question:   Supervising Provider    Answer:   Anson Fret [[id number]]       Shawnie Dapper, MSN, FNP-C 10/22/2022, 4:11 PM  [facility] [address], [address] [place], [place] [address] [phone]

## 2022-10-20 NOTE — Patient Instructions (Incomplete)
Below is our plan:  We will switch you back to [facility]. Stop Amovig and start Emgality asap. Continue sumatriptan autoinjector up to twice daily. No more than ten injections per month. If you are taking sumatriptan tablets at home, these are included in this total. Do not exceed a total of 10 total doses each month.   We can definitely consider Botox if switching back to [facility] does not help.   Please make sure you are staying well hydrated. I recommend 50-60 ounces daily. Well balanced diet and regular exercise encouraged. Consistent sleep schedule with 6-8 hours recommended.   Please continue follow up with care team as directed.   Follow up with me in 6 months   You may receive a survey regarding today's visit. I encourage you to leave honest feed back as I do use this information to improve patient care. Thank you for seeing me today!   GENERAL HEADACHE INFORMATION:   Natural supplements: Magnesium Oxide or Magnesium Glycinate 500 mg at bed (up to 800 mg daily) Coenzyme Q10 300 mg in AM Vitamin B2- 200 mg twice a day   Add 1 supplement at a time since even natural supplements can have undesirable side effects. You can sometimes buy supplements cheaper (especially Coenzyme Q10) at www.WebmailGuide.co.za or at [employer].   Vitamins and herbs that show potential:   Magnesium: Magnesium (250 mg twice a day or 500 mg at bed) has a relaxant effect on smooth muscles such as blood vessels. Individuals suffering from frequent or daily headache usually have low magnesium levels which can be increase with daily supplementation of 400-750 mg. Three trials found 40-90% average headache reduction  when used as a preventative. Magnesium also demonstrated the benefit in menstrually related migraine.  Magnesium is part of the messenger system in the serotonin cascade and it is a good muscle relaxant.  It is also useful for constipation which can be a side effect of other medications used to treat migraine. Good  sources include nuts, whole grains, and tomatoes. Side Effects: loose stool/diarrhea  Riboflavin (vitamin B 2) 200 mg twice a day. This vitamin assists nerve cells in the production of ATP a principal energy storing molecule.  It is necessary for many chemical reactions in the body.  There have been at least 3 clinical trials of riboflavin using 400 mg per day all of which suggested that migraine frequency can be decreased.  All 3 trials showed significant improvement in over half of migraine sufferers.  The supplement is found in bread, cereal, milk, meat, and poultry.  Most Americans get more riboflavin than the recommended daily allowance, however riboflavin deficiency is not necessary for the supplements to help prevent headache. Side effects: energizing, green urine   Coenzyme Q10: This is present in almost all cells in the body and is critical component for the conversion of energy.  Recent studies have shown that a nutritional supplement of CoQ10 can reduce the frequency of migraine attacks by improving the energy production of cells as with riboflavin.  Doses of 150 mg twice a day have been shown to be effective.   Melatonin: Increasing evidence shows correlation between melatonin secretion and headache conditions.  Melatonin supplementation has decreased headache intensity and duration.  It is widely used as a sleep aid.  Sleep is natures way of dealing with migraine.  A dose of 3 mg is recommended to start for headaches including cluster headache. Higher doses up to 15 mg has been reviewed for use in Cluster  headache and have been used. The rationale behind using melatonin for cluster is that many theories regarding the cause of Cluster headache center around the disruption of the normal circadian rhythm in the brain.  This helps restore the normal circadian rhythm.   HEADACHE DIET: Foods and beverages which may trigger migraine Note that only 20% of headache patients are food sensitive. You will  know if you are food sensitive if you get a headache consistently 20 minutes to 2 hours after eating a certain food. Only cut out a food if it causes headaches, otherwise you might remove foods you enjoy! What matters most for diet is to eat a well balanced healthy diet full of vegetables and low fat protein, and to not miss meals.   Chocolate, other sweets ALL cheeses except cottage and cream cheese Dairy products, yogurt, sour cream, ice cream Liver Meat extracts (Bovril, Marmite, meat tenderizers) Meats or fish which have undergone aging, fermenting, pickling or smoking. These include: Hotdogs,salami,Lox,sausage, mortadellas,smoked salmon, pepperoni, Pickled herring Pods of broad bean (English beans, Chinese pea pods, [place] (fava) beans, lima and navy beans Ripe avocado, ripe banana Yeast extracts or active yeast preparations such as Brewer's or Fleishman's (commercial bakes goods are permitted) Tomato based foods, pizza (lasagna, etc.)   MSG (monosodium glutamate) is disguised as many things; look for these common aliases: Monopotassium glutamate Autolysed yeast Hydrolysed protein Sodium caseinate "flavorings" "all natural preservatives" Nutrasweet   Avoid all other foods that convincingly provoke headaches.   Resources: The Dizzy [place] Laundry Your Headache Diet, migrainestrong.com  https://zamora-andrews.com/   Caffeine and Migraine For patients that have migraine, caffeine intake more than 3 days per week can lead to dependency and increased migraine frequency. I would recommend cutting back on your caffeine intake as best you can. The recommended amount of caffeine is 200-300 mg daily, although migraine patients may experience dependency at even lower doses. While you may notice an increase in headache temporarily, cutting back will be helpful for headaches in the long run. For more information on caffeine and migraine, visit:  https://americanmigrainefoundation.org/resource-library/caffeine-and-migraine/   Headache Prevention Strategies:   1. Maintain a headache diary; learn to identify and avoid triggers.  - This can be a simple note where you log when you had a headache, associated symptoms, and medications used - There are several smartphone apps developed to help track migraines: Migraine Buddy, Migraine Monitor, Curelator N1-Headache App   Common triggers include: Emotional triggers: Emotional/Upset family or friends Emotional/Upset occupation Business reversal/success Anticipation anxiety Crisis-serious Post-crisis periodNew job/position   Physical triggers: Vacation Day Weekend Strenuous Exercise High Altitude Location New Move Menstrual Day Physical Illness Oversleep/Not enough sleep Weather changes Light: Photophobia or light sesnitivity treatment involves a balance between desensitization and reduction in overly strong input. Use dark polarized glasses outside, but not inside. Avoid bright or fluorescent light, but do not dim environment to the point that going into a normally lit room hurts. Consider FL-41 tint lenses, which reduce the most irritating wavelengths without blocking too much light.  These can be obtained at axonoptics.com or theraspecs.com Foods: see list above.   2. Limit use of acute treatments (over-the-counter medications, triptans, etc.) to no more than 2 days per week or 10 days per month to prevent medication overuse headache (rebound headache).     3. Follow a regular schedule (including weekends and holidays): Don't skip meals. Eat a balanced diet. 8 hours of sleep nightly. Minimize stress. Exercise 30 minutes per day. Being overweight is associated with a  5 times increased risk of chronic migraine. Keep well hydrated and drink 6-8 glasses of water per day.   4. Initiate non-pharmacologic measures at the earliest onset of your headache. Rest and quiet  environment. Relax and reduce stress. Breathe2Relax is a free app that can instruct you on    some simple relaxtion and breathing techniques. Http://Dawnbuse.com is a    free website that provides teaching videos on relaxation.  Also, there are  many apps that   can be downloaded for "mindful" relaxation.  An app called YOGA NIDRA will help walk you through mindfulness. Another app called Calm can be downloaded to give you a structured mindfulness guide with daily reminders and skill development. Headspace for guided meditation Mindfulness Based Stress Reduction Online Course: www.palousemindfulness.com Cold compresses.   5. Don't wait!! Take the maximum allowable dosage of prescribed medication at the first sign of migraine.   6. Compliance:  Take prescribed medication regularly as directed and at the first sign of a migraine.   7. Communicate:  Call your physician when problems arise, especially if your headaches change, increase in frequency/severity, or become associated with neurological symptoms (weakness, numbness, slurred speech, etc.).   8. Headache/pain management therapies: Consider various complementary methods, including medication, behavioral therapy, psychological counselling, biofeedback, massage therapy, acupuncture, dry needling, and other modalities.  Such measures may reduce the need for medications. Counseling for pain management, where patients learn to function and ignore/minimize their pain, seems to work very well.   9. Recommend changing family's attention and focus away from patient's headaches. Instead, emphasize daily activities. If first question of day is 'How are your headaches/Do you have a headache today?', then patient will constantly think about headaches, thus making them worse. Goal is to re-direct attention away from headaches, toward daily activities and other distractions.   10. Helpful  Websites: www.AmericanHeadacheSociety.org PatentHood.ch www.headaches.org TightMarket.nl www.achenet.org

## 2022-10-21 ENCOUNTER — Other Ambulatory Visit: Payer: Self-pay | Admitting: Urology

## 2022-10-21 DIAGNOSIS — E349 Endocrine disorder, unspecified: Secondary | ICD-10-CM

## 2022-10-22 ENCOUNTER — Ambulatory Visit (INDEPENDENT_AMBULATORY_CARE_PROVIDER_SITE_OTHER): Payer: BC Managed Care – PPO | Admitting: Family Medicine

## 2022-10-22 ENCOUNTER — Telehealth: Payer: Self-pay

## 2022-10-22 ENCOUNTER — Other Ambulatory Visit: Payer: Self-pay | Admitting: Family Medicine

## 2022-10-22 ENCOUNTER — Other Ambulatory Visit: Payer: Self-pay | Admitting: Urology

## 2022-10-22 ENCOUNTER — Encounter: Payer: Self-pay | Admitting: Family Medicine

## 2022-10-22 VITALS — BP 175/112 | HR 106 | Ht 65.0 in | Wt 191.0 lb

## 2022-10-22 DIAGNOSIS — Z8669 Personal history of other diseases of the nervous system and sense organs: Secondary | ICD-10-CM

## 2022-10-22 DIAGNOSIS — E349 Endocrine disorder, unspecified: Secondary | ICD-10-CM

## 2022-10-22 DIAGNOSIS — G43709 Chronic migraine without aura, not intractable, without status migrainosus: Secondary | ICD-10-CM | POA: Diagnosis not present

## 2022-10-22 MED ORDER — SUMATRIPTAN SUCCINATE 6 MG/0.5ML ~~LOC~~ SOAJ: 6.0000 mg | Freq: Every day | SUBCUTANEOUS | 5 refills | Status: DC | PRN

## 2022-10-22 MED ORDER — EMGALITY 120 MG/ML ~~LOC~~ SOAJ: 120.0000 mg | SUBCUTANEOUS | 3 refills | Status: DC

## 2022-10-22 MED ORDER — QULIPTA 60 MG PO TABS: 60.0000 mg | ORAL_TABLET | Freq: Every day | ORAL | 3 refills | Status: DC

## 2022-10-22 NOTE — Telephone Encounter (Signed)
Patient's wife called and states she is having trouble with [facility] sending the Testosterone. She would like to just get this medication sent to [facility] from now on so he doesn't fall behind on getting the injections.

## 2022-10-22 NOTE — Telephone Encounter (Signed)
Pt states at his last visit he was told to stop Cialis and was given Flomax. - 08/19/22 Cystoscopy for urinary frequency.   Pt is urinating better, however he is unable to obtain an erection.   Reviewed last note and I do NOT see where pt was told to stop Cialis.   Pls advise.   Pt aware if may be 48 hours before he gets a return call.   CM

## 2022-10-22 NOTE — Telephone Encounter (Signed)
Okay to take both the tadalafil and tamsulosin

## 2022-10-23 MED ORDER — TAMSULOSIN HCL 0.4 MG PO CAPS: 0.4000 mg | ORAL_CAPSULE | Freq: Every day | ORAL | 1 refills | Status: DC

## 2022-10-23 MED ORDER — TADALAFIL 5 MG PO TABS: 5.0000 mg | ORAL_TABLET | Freq: Every day | ORAL | 0 refills | Status: DC | PRN

## 2022-10-23 NOTE — Telephone Encounter (Signed)
Submitted PA Ubrelvy on covermymeds. KeySW:[id number]. Waiting on determination from [facility].

## 2022-10-23 NOTE — Telephone Encounter (Signed)
Sent in medication and advised patient

## 2022-10-29 DIAGNOSIS — M533 Sacrococcygeal disorders, not elsewhere classified: Secondary | ICD-10-CM | POA: Diagnosis not present

## 2022-11-11 DIAGNOSIS — F4312 Post-traumatic stress disorder, chronic: Secondary | ICD-10-CM | POA: Diagnosis not present

## 2022-11-11 DIAGNOSIS — F410 Panic disorder [episodic paroxysmal anxiety] without agoraphobia: Secondary | ICD-10-CM | POA: Diagnosis not present

## 2022-11-11 DIAGNOSIS — F603 Borderline personality disorder: Secondary | ICD-10-CM | POA: Diagnosis not present

## 2022-11-11 DIAGNOSIS — F411 Generalized anxiety disorder: Secondary | ICD-10-CM | POA: Diagnosis not present

## 2022-11-12 ENCOUNTER — Telehealth: Payer: Self-pay

## 2022-11-12 DIAGNOSIS — F4310 Post-traumatic stress disorder, unspecified: Secondary | ICD-10-CM

## 2022-11-12 NOTE — Telephone Encounter (Signed)
Can you get extra information on this, about the specific therapist he had in mind?  Thanks.

## 2022-11-12 NOTE — Telephone Encounter (Signed)
Called and spoke with patients wife (okay per DPR); patient was sleeping. Cody [name] stated that patient did not have a certain therapist in mind; just that he needs one. There was a referral on last year but patient was in the hospital around that time and was never able to get anything set up. He already see a [facility] which is why they sent over the fax to get a referral for a therapist.

## 2022-11-12 NOTE — Telephone Encounter (Signed)
Received fax from [facility]. Message stated that Cody [name] stated there was a therapist recommended. Can Dr. Damita Dunnings send a referral to that therapist?

## 2022-11-13 ENCOUNTER — Other Ambulatory Visit: Payer: BC Managed Care – PPO

## 2022-11-15 NOTE — Telephone Encounter (Signed)
I put in the referral.  Please let me know if this will not suffice.  Thanks.

## 2022-11-15 NOTE — Addendum Note (Signed)
Addended by: Joaquim Nam on: 11/15/2022 06:11 PM   Modules accepted: Orders

## 2022-11-17 ENCOUNTER — Telehealth: Payer: Medicare Other | Admitting: Family Medicine

## 2022-11-17 ENCOUNTER — Other Ambulatory Visit: Payer: Medicare Other

## 2022-11-17 DIAGNOSIS — E230 Hypopituitarism: Secondary | ICD-10-CM | POA: Diagnosis not present

## 2022-11-19 ENCOUNTER — Other Ambulatory Visit: Payer: Self-pay | Admitting: Urology

## 2022-11-19 ENCOUNTER — Telehealth: Payer: Self-pay | Admitting: Family Medicine

## 2022-11-19 DIAGNOSIS — D751 Secondary polycythemia: Secondary | ICD-10-CM

## 2022-11-19 DIAGNOSIS — E230 Hypopituitarism: Secondary | ICD-10-CM

## 2022-11-19 LAB — HEMOGLOBIN: Hemoglobin: 17.6 g/dL (ref 13.0–17.7)

## 2022-11-19 LAB — HEMATOCRIT: Hematocrit: 54.2 % — ABNORMAL HIGH (ref 37.5–51.0)

## 2022-11-19 LAB — TESTOSTERONE: Testosterone: 126 ng/dL — ABNORMAL LOW (ref 264–916)

## 2022-11-19 NOTE — Telephone Encounter (Signed)
-----   Message from Harle Battiest, PA-C sent at 11/19/2022 10:42 AM EDT ----- Please let Cody [name] know that his hematocrit has gone up again.  We either need to refer him to hematology or he can donate blood and we can recheck his levels, but he needs to stop the testosterone in the meantime.

## 2022-11-19 NOTE — Telephone Encounter (Signed)
Patient notified and he would like a referral to Hematology.

## 2022-11-23 ENCOUNTER — Encounter: Payer: Self-pay | Admitting: Oncology

## 2022-11-23 ENCOUNTER — Inpatient Hospital Stay: Payer: BC Managed Care – PPO

## 2022-11-23 ENCOUNTER — Telehealth: Payer: Self-pay | Admitting: *Deleted

## 2022-11-23 ENCOUNTER — Inpatient Hospital Stay: Payer: BC Managed Care – PPO | Attending: Oncology | Admitting: Oncology

## 2022-11-23 VITALS — BP 131/99 | HR 88 | Temp 96.9°F | Ht 65.0 in | Wt 182.0 lb

## 2022-11-23 DIAGNOSIS — D751 Secondary polycythemia: Secondary | ICD-10-CM | POA: Diagnosis not present

## 2022-11-23 NOTE — Telephone Encounter (Signed)
We dont need the specifics of when he started or stopped testosterone. So we dont need to call at this point. I will update chart wit above data

## 2022-11-23 NOTE — Progress Notes (Signed)
Hematology/Oncology Consult note [facility] [phone] [phone]  Patient Care Team: Joaquim Nam, MD as PCP - General (Family Medicine) Darliss Ridgel, MD as Referring Physician (Psychiatry) Salena Saner, MD as Referring Physician (Pain Medicine)   Name of the patient: Cody [name]  [id number]  June 13, 1971    Reason for referral-polycythemia   Referring physician-Dr. Para March  Date of visit: 11/23/22   History of presenting illness-patient is a 52 year old male with a past medical history significant for smoking hypertension hyperlipidemia anxiety, chronic back pain requiring opioid pump, who has been referredFor polycythemia.Marland Kitchen  His hemoglobin was between 14-15 in July 2023 and has gradually increased since then presently at 17.6.  He had urine analysis done February 2024 which did not show any evidence of hematuria.  He is on testosterone supplementation for hypogonadism through urology.  Testosterone levels on 11/17/2022 were low at 126 after they were elevated at 1498 in December 2023.  Patient does not smoke cigarettes but does vape.  He was told that he has obstructive sleep apnea in the past but he did not use CPAP.  Currently he reports sleeping well.  He is on testosterone supplementation for the last 1 year or so.  This is being given for opioid-induced hypogonadism.  ECOG PS- 1  Pain scale- 3   Review of systems- Review of Systems  Constitutional:  Positive for malaise/fatigue. Negative for chills, fever and weight loss.  HENT:  Negative for congestion, ear discharge and nosebleeds.   Eyes:  Negative for blurred vision.  Respiratory:  Negative for cough, hemoptysis, sputum production, shortness of breath and wheezing.   Cardiovascular:  Negative for chest pain, palpitations, orthopnea and claudication.  Gastrointestinal:  Negative for abdominal pain, blood in stool, constipation, diarrhea, heartburn, melena, nausea and  vomiting.  Genitourinary:  Negative for dysuria, flank pain, frequency, hematuria and urgency.  Musculoskeletal:  Positive for back pain. Negative for joint pain and myalgias.  Skin:  Negative for rash.  Neurological:  Negative for dizziness, tingling, focal weakness, seizures, weakness and headaches.  Endo/Heme/Allergies:  Does not bruise/bleed easily.  Psychiatric/Behavioral:  Negative for depression and suicidal ideas. The patient does not have insomnia.     Allergies  Allergen Reactions   Neurontin [Gabapentin]     lethargic   Vancomycin     Swelling    Doxycycline     vomiting   Lithium     tremor   Mirtazapine     REACTION: lethargy ( Remeron)    Patient Active Problem List   Diagnosis Date Noted   Creatinine elevation 03/11/2022   Healthcare maintenance 03/11/2022   PTSD (post-traumatic stress disorder) 02/04/2022   Anal spasm 01/13/2022   Thrombosed hemorrhoids 01/09/2022   Hypertension 02/24/2021   Vaping nicotine dependence, 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   Chronic migraine with aura 11/12/2020   Chronic migraine without aura without status migrainosus, not intractable 11/12/2020   Nonintractable headache 09/01/2020   Diarrhea 06/16/2020   Medicare annual wellness visit, initial 03/04/2020   Fatty liver 07/05/2019   Obesity with body mass index greater than 30 04/27/2018   Advance care planning 09/15/2015   LLQ pain 09/15/2015   Low serum testosterone level 04/04/2011   Vitamin D deficiency 05/01/2010   CIGARETTE SMOKER 05/01/2010   HLD (hyperlipidemia) 04/01/2009   Anxiety state 04/01/2009   Bipolar affective 04/01/2009   FASTING HYPERGLYCEMIA 04/01/2009   Hypothyroidism 04/07/2007   SYNDROME, CHRONIC PAIN 04/07/2007     Past Medical History:  Diagnosis Date   Anxiety    Bipolar 2 disorder 03/2009   Dr Caryn Section   Chronic back pain    Chronic pain syndrome    after he had a hematoma from back surgery, "pressure on spine"   Complication of  anesthesia    WOKE UP SHAKING VIOLENTLY   DDD (degenerative disc disease)    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; herniated disc L5- S1   Degenerative disk disease    Depression    Fracture, mandible    Fracture, tibia    Headache    Hyperlipidemia    Hypertension    resolved   Hypothyroidism 05/2007   Dr Marga Melnick (retired)/ Dr Crawford Givens   Neuromuscular disorder    PTSD (post-traumatic stress disorder)    Suicidal behavior    a.) ideations with attempt x 1     Past Surgical History:  Procedure Laterality Date   APPENDECTOMY     peritonitis   BACK SURGERY     EVALUATION UNDER ANESTHESIA WITH HEMORRHOIDECTOMY N/A 01/28/2022   Procedure: EXAM UNDER ANESTHESIA WITH HEMORRHOIDECTOMY;  Surgeon: Campbell Lerner, MD;  Location: [facility] ORS;  Service: General;  Laterality: N/A;   FRACTURE SURGERY     INTRATHECAL PUMP IMPLANTATION  2007   LAMINECTOMY  2004   arterial injury, transfused 7 units pc, implant surgery    MANDIBLE FRACTURE SURGERY     mugged   THORACIC DISCECTOMY     T10   UPPER GASTROINTESTINAL ENDOSCOPY  2006   gastritis    Social History   Socioeconomic History   Marital status: Married    Spouse name: Research scientist (physical sciences)   Number of children: 0   Years of education: Not on file   Highest education level: Not on file  Occupational History   Occupation: Disabled  Tobacco Use   Smoking status: Former    Packs/day: .25    Types: E-cigarettes, Cigarettes    Quit date: 08/10/2009    Years since quitting: 13.2    Passive exposure: Never   Smokeless tobacco: Never   Tobacco comments:    smoked age intermittently age [age]; up to 2 cigarettes/ day   Vaping Use   Vaping Use: Every day   Substances: Nicotine, Flavoring  Substance and Sexual Activity   Alcohol use: No   Drug use: No   Sexual activity: Not on file  Other Topics Concern   Not on file  Social History Narrative   From [place].     [address] Grad   To [place] 2000   Disability from bipolar affective  disorder, prev accounting.     Married 1997   No kids   Enjoys walking, travel.     Social Determinants of Health   Financial Resource Strain: Low Risk  (03/03/2022)   Overall Financial Resource Strain (CARDIA)    Difficulty of Paying Living Expenses: Not hard at all  Food Insecurity: No Food Insecurity (03/03/2022)   Hunger Vital Sign    Worried About Running Out of Food in the Last Year: Never true    Ran Out of Food in the Last Year: Never true  Transportation Needs: No Transportation Needs (03/03/2022)   PRAPARE - Administrator, Civil Service (Medical): No    Lack of Transportation (Non-Medical): No  Physical Activity: Insufficiently Active (03/03/2022)   Exercise Vital Sign    Days of Exercise per Week: 2 days    Minutes of Exercise per Session: 20 min  Stress: No Stress  Concern Present (03/03/2022)   [employer] of Occupational Health - Occupational Stress Questionnaire    Feeling of Stress : Only a little  Social Connections: Socially Isolated (03/03/2022)   Social Connection and Isolation Panel [NHANES]    Frequency of Communication with Friends and Family: Once a week    Frequency of Social Gatherings with Friends and Family: Once a week    Attends Religious Services: Never    Database administrator or Organizations: No    Attends Banker Meetings: Never    Marital Status: Married  Catering manager Violence: Not At Risk (03/03/2022)   Humiliation, Afraid, Rape, and Kick questionnaire    Fear of Current or Ex-Partner: No    Emotionally Abused: No    Physically Abused: No    Sexually Abused: No     Family History  Problem Relation Age of Onset   Ulcers Mother    Depression Mother    Thyroid disease Mother        hypo   Stroke Father 72   Thyroid disease Father        hypo   Esophageal cancer Maternal Aunt    Lung cancer Maternal Grandmother    Thyroid disease Maternal Grandmother        hypo   Colon cancer Maternal Grandfather     Heart attack Maternal Grandfather 55   Esophageal cancer Maternal Grandfather    Diabetes Neg Hx    Prostate cancer Neg Hx    Kidney cancer Neg Hx    Bladder Cancer Neg Hx    Migraines Neg Hx    Headache Neg Hx    Colon polyps Neg Hx    Rectal cancer Neg Hx    Stomach cancer Neg Hx      Current Outpatient Medications:    AMBULATORY NON FORMULARY MEDICATION, 8.608 mg by Intrathecal Infusion route daily. Morphine Pump Implant, Disp: , Rfl:    Atogepant (QULIPTA) 60 MG TABS, Take 1 tablet (60 mg total) by mouth daily., Disp: 90 tablet, Rfl: 3   atorvastatin (LIPITOR) 10 MG tablet, TAKE 1 TABLET BY MOUTH DAILY, Disp: 90 tablet, Rfl: 2   buPROPion (WELLBUTRIN XL) 150 MG 24 hr tablet, Take 150 mg by mouth every morning., Disp: , Rfl:    busPIRone (BUSPAR) 10 MG tablet, Take 1 tablet (10 mg total) by mouth 2 (two) times daily., Disp: , Rfl:    CAPLYTA 42 MG capsule, Take 42 mg by mouth daily., Disp: , Rfl:    Cholecalciferol (VITAMIN D) 50 MCG (2000 UT) CAPS, Take 1 capsule by mouth daily., Disp: , Rfl:    Galcanezumab-gnlm (EMGALITY) 120 MG/ML SOAJ, Inject 120 mg into the skin every 30 (thirty) days., Disp: 3 mL, Rfl: 3   hydrOXYzine (ATARAX) 50 MG tablet, Take 1 tablet (50 mg total) by mouth in the morning and at bedtime. BID, Disp: 180 tablet, Rfl: 0   lamoTRIgine (LAMICTAL) 100 MG tablet, Take 100 mg by mouth 2 (two) times daily., Disp: , Rfl:    levothyroxine (SYNTHROID) 75 MCG tablet, TAKE 1 TABLET BY MOUTH DAILY  BEFORE BREAKFAST, Disp: 90 tablet, Rfl: 3   NEEDLE, DISP, 18 G (BD SAFETYGLIDE NEEDLE) 18G X 1-1/2" MISC, Draw up, Disp: 50 each, Rfl: 0   NUCYNTA 100 MG TABS, Take 1 tablet by mouth every 6 (six) hours as needed., Disp: , Rfl:    Omega-3 1000 MG CAPS, Take 2 each by mouth daily., Disp: , Rfl:    OVER  THE COUNTER MEDICATION, Take 1 Piece by mouth daily. CBD gummy (contains small amount of THC), Disp: , Rfl:    polyethylene glycol powder (GLYCOLAX/MIRALAX) 17 GM/SCOOP powder,  Take 17 g by mouth every other day as needed., Disp: , Rfl:    promethazine (PHENERGAN) 25 MG tablet, TAKE 1/2 TO 1 TABLET BY MOUTH EVERY 8 HOURS AS NEEDED FOR NAUSEA AND VOMITING, Disp: 20 tablet, Rfl: 1   SUMAtriptan 6 MG/0.5ML SOAJ, Inject 6 mg into the skin daily as needed (with 2nd dose 2 hours after 1st dose if needed.)., Disp: 5 mL, Rfl: 5   SYRINGE-NEEDLE, DISP, 3 ML (B-D 3CC LUER-LOK SYR 21GX1-1/2) 21G X 1-1/2" 3 ML MISC, Use this needle to injection, Disp: 50 each, Rfl: 0   tadalafil (CIALIS) 5 MG tablet, Take 1 tablet (5 mg total) by mouth daily as needed for erectile dysfunction., Disp: 90 tablet, Rfl: 0   tamsulosin (FLOMAX) 0.4 MG CAPS capsule, Take 1 capsule (0.4 mg total) by mouth daily after breakfast., Disp: 90 capsule, Rfl: 1   Tapentadol HCl 100 MG TABS, Take by mouth., Disp: , Rfl:    testosterone cypionate (DEPOTESTOSTERONE CYPIONATE) 200 MG/ML injection, INJECT 0.75 ML INTO THE MUSCLE EVERY 14 DAYS, Disp: 2 mL, Rfl: 0   tiZANidine (ZANAFLEX) 4 MG tablet, Take 4 mg by mouth as needed., Disp: , Rfl:    topiramate (TOPAMAX) 50 MG tablet, Take 50 mg by mouth daily., Disp: , Rfl:    vortioxetine HBr (TRINTELLIX) 10 MG TABS tablet, Take 10 mg by mouth daily., Disp: , Rfl:    Physical exam:  Vitals:   11/23/22 1052 11/23/22 1053  BP:  (!) 131/99  Pulse:  88  Temp:  (!) 96.9 F (36.1 C)  TempSrc:  Tympanic  SpO2:  98%  Weight: 182 lb (82.6 kg) 182 lb (82.6 kg)  Height:  5\' 5"  (1.651 m)   Physical Exam Cardiovascular:     Rate and Rhythm: Normal rate and regular rhythm.     Heart sounds: Normal heart sounds.  Pulmonary:     Effort: Pulmonary effort is normal.     Breath sounds: Normal breath sounds.  Abdominal:     General: Bowel sounds are normal.     Palpations: Abdomen is soft.     Comments: No palpable hepatosplenomegaly  Skin:    General: Skin is warm and dry.  Neurological:     Mental Status: He is alert and oriented to person, place, and time.            Latest Ref Rng & Units 10/02/2022    2:31 PM  CMP  Glucose 70 - 99 mg/dL 161   BUN 6 - 24 mg/dL 15   Creatinine 0.96 - 1.27 mg/dL 0.45   Sodium 409 - 811 mmol/L 146   Potassium 3.5 - 5.2 mmol/L 3.9   Chloride 96 - 106 mmol/L 104   CO2 20 - 29 mmol/L 22   Calcium 8.7 - 10.2 mg/dL 9.2       Latest Ref Rng & Units 11/17/2022    2:22 PM  CBC  Hemoglobin 13.0 - 17.7 g/dL 91.4   Hematocrit 78.2 - 51.0 % 54.2      Assessment and plan- Patient is a 52 y.o. male referred for polycythemia  Patient's hemoglobin/hematocrit has gradually gone up from 15-17.6 over the last 8 months.  Discussed differences between polycythemia vera and secondary polycythemia.  Patient has multiple risk factors for secondary polycythemia including testosterone replacement therapy,  possible sleep apnea as well as vaping.  I will be checking CBC, CMP, JAK2, CALR, MPL, exon 12 and EPO levels today.  I will see him back in 2 weeks time to discuss the results of blood work.  If he has P vera our threshold would be to keep hematocrit less than 45.  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  P vera increases the risk of thrombosis and cardiovascular events but that is not typically seen with secondary polycythemia.   Thank you for this kind referral and the opportunity to participate in the care of this patient   Visit Diagnosis 1. Polycythemia     Dr. Owens Shark, MD, MPH [facility] [phone] 11/23/2022

## 2022-11-23 NOTE — Telephone Encounter (Signed)
Patient called stating that he needs his chart updated as he remembers now when he started his testosterone and problems he has had with it starting and stopping and restarting it. He reports that he initially started it in late 2021 and it was stopped due to his levels being elevated, was restarted on it and again his levels went up. He is asking for a return call to further discuss this.

## 2022-11-25 DIAGNOSIS — D751 Secondary polycythemia: Secondary | ICD-10-CM | POA: Diagnosis not present

## 2022-12-01 ENCOUNTER — Encounter: Payer: Self-pay | Admitting: Oncology

## 2022-12-04 ENCOUNTER — Encounter: Payer: Self-pay | Admitting: Oncology

## 2022-12-09 ENCOUNTER — Inpatient Hospital Stay: Payer: BC Managed Care – PPO | Attending: Oncology | Admitting: Oncology

## 2022-12-09 ENCOUNTER — Encounter: Payer: Self-pay | Admitting: Oncology

## 2022-12-09 ENCOUNTER — Inpatient Hospital Stay: Payer: BC Managed Care – PPO

## 2022-12-09 VITALS — BP 130/90 | HR 76 | Temp 97.9°F | Resp 18

## 2022-12-09 VITALS — BP 120/81 | HR 88 | Temp 98.4°F | Resp 18 | Ht 65.0 in | Wt 189.9 lb

## 2022-12-09 DIAGNOSIS — G4733 Obstructive sleep apnea (adult) (pediatric): Secondary | ICD-10-CM | POA: Insufficient documentation

## 2022-12-09 DIAGNOSIS — E291 Testicular hypofunction: Secondary | ICD-10-CM | POA: Insufficient documentation

## 2022-12-09 DIAGNOSIS — D751 Secondary polycythemia: Secondary | ICD-10-CM | POA: Insufficient documentation

## 2022-12-09 DIAGNOSIS — Z79890 Hormone replacement therapy (postmenopausal): Secondary | ICD-10-CM | POA: Insufficient documentation

## 2022-12-09 NOTE — Progress Notes (Signed)
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  He had urine analysis done February 2024 which did not show any evidence of hematuria.  He is on testosterone supplementation for hypogonadism through urology.  Testosterone levels on 11/17/2022 were low at 126 after they were elevated at 1498 in December 2023.   Patient does not smoke cigarettes but does vape.  He was told that he has obstructive sleep apnea in the past but he did not use CPAP.  Currently he reports sleeping well.  He is on testosterone supplementation for the last 1 year or so.  This is being given for opioid-induced hypogonadism.  Results of workup fromApril 2024 showed negative JAK2 mutation and normal EPO levels.  Interval history-patient has baseline fatigue and chronic low back pain.  Denies any new complaints at this time  ECOG PS- 1 Pain scale- 3   Review of systems- Review of Systems  Constitutional:  Positive for malaise/fatigue. Negative for chills, fever and weight loss.  HENT:  Negative for congestion, ear discharge and nosebleeds.   Eyes:  Negative for blurred  vision.  Respiratory:  Negative for cough, hemoptysis, sputum production, shortness of breath and wheezing.   Cardiovascular:  Negative for chest pain, palpitations, orthopnea and claudication.  Gastrointestinal:  Negative for abdominal pain, blood in stool, constipation, diarrhea, heartburn, melena, nausea and vomiting.  Genitourinary:  Negative for dysuria, flank pain, frequency, hematuria and urgency.  Musculoskeletal:  Positive for back pain. Negative for joint pain and myalgias.  Skin:  Negative for rash.  Neurological:  Negative for dizziness, tingling, focal weakness, seizures, weakness and headaches.  Endo/Heme/Allergies:  Does not bruise/bleed easily.  Psychiatric/Behavioral:  Negative for depression and suicidal ideas. The patient does not have insomnia.       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; herniated disc L5- S1   Degenerative disk disease    Depression    Fracture, mandible (HCC)    Fracture, tibia    Headache    Hyperlipidemia    Hypertension    resolved   Hypothyroidism  05/2007   Dr Marga Melnick (retired)/ Dr Crawford Givens   Neuromuscular disorder [facility])    PTSD (post-traumatic stress disorder)    Suicidal behavior    a.) ideations with attempt x 1     Past Surgical History:  Procedure Laterality Date   APPENDECTOMY     peritonitis   BACK SURGERY     EVALUATION UNDER ANESTHESIA WITH HEMORRHOIDECTOMY N/A 01/28/2022   Procedure: EXAM UNDER ANESTHESIA WITH  HEMORRHOIDECTOMY;  Surgeon: Campbell Lerner, MD;  Location: [facility] ORS;  Service: General;  Laterality: N/A;   FRACTURE SURGERY     INTRATHECAL PUMP IMPLANTATION  2007   LAMINECTOMY  2004   arterial injury, transfused 7 units pc, implant surgery    MANDIBLE FRACTURE SURGERY     mugged   THORACIC DISCECTOMY     T10   UPPER GASTROINTESTINAL ENDOSCOPY  2006   gastritis    Social History   Socioeconomic History   Marital status: Married    Spouse name: Research scientist (physical sciences)   Number of children: 0   Years of education: Not on file   Highest education level: Not on file  Occupational History   Occupation: Disabled  Tobacco Use   Smoking status: Former    Packs/day: .25    Types: E-cigarettes, Cigarettes    Quit date: 08/10/2009    Years since quitting: 13.3    Passive exposure: Never   Smokeless tobacco: Never   Tobacco comments:    smoked age intermittently age [age]; up to 2 cigarettes/ day   Vaping Use   Vaping Use: Every day   Substances: Nicotine, Flavoring  Substance and Sexual Activity   Alcohol use: No   Drug use: No   Sexual activity: Not on file  Other Topics Concern   Not on file  Social History Narrative   From [place].     [address] Grad   To [place] 2000   Disability from bipolar affective disorder, prev accounting.     Married 1997   No kids   Enjoys walking, travel.     Social Determinants of Health   Financial Resource Strain: Low Risk  (03/03/2022)   Overall Financial Resource Strain (CARDIA)    Difficulty of Paying Living Expenses: Not hard at all  Food Insecurity: No Food Insecurity (11/23/2022)   Hunger Vital Sign    Worried About Running Out of Food in the Last Year: Never true    Ran Out of Food in the Last Year: Never true  Transportation Needs: No Transportation Needs (11/23/2022)   PRAPARE - Administrator, Civil Service (Medical): No    Lack of Transportation (Non-Medical): No  Physical Activity: Insufficiently Active (03/03/2022)    Exercise Vital Sign    Days of Exercise per Week: 2 days    Minutes of Exercise per Session: 20 min  Stress: No Stress Concern Present (03/03/2022)   [employer] of Occupational Health - Occupational Stress Questionnaire    Feeling of Stress : Only a little  Social Connections: Socially Isolated (03/03/2022)   Social Connection and Isolation Panel [NHANES]    Frequency of Communication with Friends and Family: Once a week    Frequency of Social Gatherings with Friends and Family: Once a week    Attends Religious Services: Never    Database administrator or Organizations: No    Attends Banker Meetings: Never    Marital Status: Married  Catering manager Violence: Not At Risk (11/23/2022)   Humiliation, Afraid,  Rape, and Kick questionnaire    Fear of Current or Ex-Partner: No    Emotionally Abused: No    Physically Abused: No    Sexually Abused: No    Family History  Problem Relation Age of Onset   Ulcers Mother    Depression Mother    Thyroid disease Mother        hypo   Stroke Father 61   Thyroid disease Father        hypo   Esophageal cancer Maternal Aunt    Lung cancer Maternal Grandmother    Thyroid disease Maternal Grandmother        hypo   Colon cancer Maternal Grandfather    Heart attack Maternal Grandfather 55   Esophageal cancer Maternal Grandfather    Diabetes Neg Hx    Prostate cancer Neg Hx    Kidney cancer Neg Hx    Bladder Cancer Neg Hx    Migraines Neg Hx    Headache Neg Hx    Colon polyps Neg Hx    Rectal cancer Neg Hx    Stomach cancer Neg Hx      Current Outpatient Medications:    ALPRAZolam (XANAX) 1 MG tablet, PLEASE SEE ATTACHED FOR DETAILED DIRECTIONS, Disp: , Rfl:    AMBULATORY NON FORMULARY MEDICATION, 8.608 mg by Intrathecal Infusion route daily. Morphine Pump Implant, Disp: , Rfl:    Atogepant (QULIPTA) 60 MG TABS, Take 1 tablet (60 mg total) by mouth daily., Disp: 90 tablet, Rfl: 3   atorvastatin (LIPITOR) 10 MG tablet,  TAKE 1 TABLET BY MOUTH DAILY, Disp: 90 tablet, Rfl: 2   buPROPion (WELLBUTRIN XL) 150 MG 24 hr tablet, Take 150 mg by mouth every morning., Disp: , Rfl:    busPIRone (BUSPAR) 10 MG tablet, Take 1 tablet (10 mg total) by mouth 2 (two) times daily., Disp: , Rfl:    CAPLYTA 42 MG capsule, Take 42 mg by mouth daily., Disp: , Rfl:    Cholecalciferol (VITAMIN D) 50 MCG (2000 UT) CAPS, Take 1 capsule by mouth daily., Disp: , Rfl:    Galcanezumab-gnlm (EMGALITY) 120 MG/ML SOAJ, Inject 120 mg into the skin every 30 (thirty) days., Disp: 3 mL, Rfl: 3   hydrOXYzine (ATARAX) 50 MG tablet, Take 1 tablet (50 mg total) by mouth in the morning and at bedtime. BID, Disp: 180 tablet, Rfl: 0   lamoTRIgine (LAMICTAL) 100 MG tablet, Take 100 mg by mouth 2 (two) times daily., Disp: , Rfl:    levothyroxine (SYNTHROID) 75 MCG tablet, TAKE 1 TABLET BY MOUTH DAILY  BEFORE BREAKFAST, Disp: 90 tablet, Rfl: 3   NEEDLE, DISP, 18 G (BD SAFETYGLIDE NEEDLE) 18G X 1-1/2" MISC, Draw up, Disp: 50 each, Rfl: 0   NUCYNTA 100 MG TABS, Take 1 tablet by mouth every 6 (six) hours as needed., Disp: , Rfl:    Omega-3 1000 MG CAPS, Take 2 each by mouth daily., Disp: , Rfl:    OVER THE COUNTER MEDICATION, Take 1 Piece by mouth daily. CBD gummy (contains small amount of THC), Disp: , Rfl:    polyethylene glycol powder (GLYCOLAX/MIRALAX) 17 GM/SCOOP powder, Take 17 g by mouth every other day as needed., Disp: , Rfl:    promethazine (PHENERGAN) 25 MG tablet, TAKE 1/2 TO 1 TABLET BY MOUTH EVERY 8 HOURS AS NEEDED FOR NAUSEA AND VOMITING, Disp: 20 tablet, Rfl: 1   SUMAtriptan 6 MG/0.5ML SOAJ, Inject 6 mg into the skin daily as needed (with 2nd dose 2 hours after 1st  dose if needed.)., Disp: 5 mL, Rfl: 5   SYRINGE-NEEDLE, DISP, 3 ML (B-D 3CC LUER-LOK SYR 21GX1-1/2) 21G X 1-1/2" 3 ML MISC, Use this needle to injection, Disp: 50 each, Rfl: 0   tadalafil (CIALIS) 5 MG tablet, Take 1 tablet (5 mg total) by mouth daily as needed for erectile dysfunction.,  Disp: 90 tablet, Rfl: 0   tamsulosin (FLOMAX) 0.4 MG CAPS capsule, Take 1 capsule (0.4 mg total) by mouth daily after breakfast., Disp: 90 capsule, Rfl: 1   Tapentadol HCl 100 MG TABS, Take by mouth., Disp: , Rfl:    testosterone cypionate (DEPOTESTOSTERONE CYPIONATE) 200 MG/ML injection, INJECT 0.75 ML INTO THE MUSCLE EVERY 14 DAYS, Disp: 2 mL, Rfl: 0   tiZANidine (ZANAFLEX) 4 MG tablet, Take 4 mg by mouth as needed., Disp: , Rfl:    topiramate (TOPAMAX) 50 MG tablet, Take 50 mg by mouth daily., Disp: , Rfl:    vortioxetine HBr (TRINTELLIX) 10 MG TABS tablet, Take 10 mg by mouth daily., Disp: , Rfl:   Physical exam:  Vitals:   12/09/22 0957  BP: 120/81  Pulse: 88  Resp: 18  Temp: 98.4 F (36.9 C)  TempSrc: Tympanic  SpO2: 96%  Weight: 189 lb 14.4 oz (86.1 kg)  Height: 5\' 5"  (1.651 m)   Physical Exam Cardiovascular:     Rate and Rhythm: Normal rate and regular rhythm.     Heart sounds: Normal heart sounds.  Pulmonary:     Effort: Pulmonary effort is normal.     Breath sounds: Normal breath sounds.  Abdominal:     General: Bowel sounds are normal.     Palpations: Abdomen is soft.  Skin:    General: Skin is warm and dry.  Neurological:     Mental Status: He is alert and oriented to person, place, and time.         Latest Ref Rng & Units 10/02/2022    2:31 PM  CMP  Glucose 70 - 99 mg/dL 865   BUN 6 - 24 mg/dL 15   Creatinine 7.84 - 1.27 mg/dL 6.96   Sodium 295 - 284 mmol/L 146   Potassium 3.5 - 5.2 mmol/L 3.9   Chloride 96 - 106 mmol/L 104   CO2 20 - 29 mmol/L 22   Calcium 8.7 - 10.2 mg/dL 9.2       Latest Ref Rng & Units 11/17/2022    2:22 PM  CBC  Hemoglobin 13.0 - 17.7 g/dL 13.2   Hematocrit 44.0 - 51.0 % 54.2      Assessment and plan- Patient is a 52 y.o. male with secondary polycythemia here to discuss results of blood work  Patient was referred for polycythemia which is likely secondary due to testosterone replacement therapy and obstructive sleep apnea.   JAK2 mutation levels were negative and EPO levels were normal thereby ruling out polycythemia vera.  Given that he is on testosterone replacement therapy and may require higher doses of testosterone in the future I will plan to keep his hematocrit less than 50.  We will proceed with phlebotomy today and continue weekly phlebotomies.  Once hematocrit is less than 50 we will continue to monitor his labs every 3 months.  He will be getting labs through [facility]. I will see him back in 3 months with labs   Visit Diagnosis 1. Polycythemia, secondary      Dr. Owens Shark, MD, MPH [facility] at [facility] [phone] 12/09/2022 1:02 PM

## 2022-12-14 DIAGNOSIS — D751 Secondary polycythemia: Secondary | ICD-10-CM | POA: Diagnosis not present

## 2022-12-15 ENCOUNTER — Encounter: Payer: Self-pay | Admitting: Oncology

## 2022-12-16 ENCOUNTER — Inpatient Hospital Stay: Payer: BC Managed Care – PPO

## 2022-12-16 VITALS — BP 118/84 | HR 102 | Temp 98.9°F

## 2022-12-16 DIAGNOSIS — Z79890 Hormone replacement therapy (postmenopausal): Secondary | ICD-10-CM | POA: Diagnosis not present

## 2022-12-16 DIAGNOSIS — E291 Testicular hypofunction: Secondary | ICD-10-CM | POA: Diagnosis not present

## 2022-12-16 DIAGNOSIS — G4733 Obstructive sleep apnea (adult) (pediatric): Secondary | ICD-10-CM | POA: Diagnosis not present

## 2022-12-16 DIAGNOSIS — D751 Secondary polycythemia: Secondary | ICD-10-CM

## 2022-12-16 NOTE — Patient Instructions (Signed)

## 2022-12-18 DIAGNOSIS — D751 Secondary polycythemia: Secondary | ICD-10-CM | POA: Diagnosis not present

## 2022-12-21 ENCOUNTER — Encounter: Payer: Self-pay | Admitting: Oncology

## 2022-12-22 ENCOUNTER — Telehealth: Payer: Self-pay

## 2022-12-22 ENCOUNTER — Other Ambulatory Visit: Payer: Self-pay | Admitting: Urology

## 2022-12-22 DIAGNOSIS — E230 Hypopituitarism: Secondary | ICD-10-CM

## 2022-12-22 MED ORDER — TESTOSTERONE CYPIONATE 200 MG/ML IM SOLN
100.0000 mg | INTRAMUSCULAR | 0 refills | Status: DC
Start: 2022-12-22 — End: 2023-05-14

## 2022-12-22 NOTE — Telephone Encounter (Signed)
Spoke with the patient and let him know per the provider there is no phlebotomy needed at this time, verbalized  understanding.

## 2022-12-22 NOTE — Progress Notes (Signed)
Please let Cody [name] know that I have sent in the prescription for his testosterone Cypionate.  He will need to start at a lower dose by injecting 0.5 cc every 14 days.  We will need to recheck his testosterone level along with his hemoglobin hematocrit 1 week after his fourth injection along with his PSA.   Patient notified and will call back to schedule when he starts the medication.

## 2022-12-23 ENCOUNTER — Inpatient Hospital Stay: Payer: BC Managed Care – PPO

## 2022-12-30 ENCOUNTER — Inpatient Hospital Stay: Payer: BC Managed Care – PPO

## 2023-01-06 ENCOUNTER — Inpatient Hospital Stay: Payer: BC Managed Care – PPO

## 2023-01-06 DIAGNOSIS — M533 Sacrococcygeal disorders, not elsewhere classified: Secondary | ICD-10-CM | POA: Diagnosis not present

## 2023-01-06 DIAGNOSIS — M47816 Spondylosis without myelopathy or radiculopathy, lumbar region: Secondary | ICD-10-CM | POA: Diagnosis not present

## 2023-01-06 DIAGNOSIS — F410 Panic disorder [episodic paroxysmal anxiety] without agoraphobia: Secondary | ICD-10-CM | POA: Diagnosis not present

## 2023-01-06 DIAGNOSIS — M5136 Other intervertebral disc degeneration, lumbar region: Secondary | ICD-10-CM | POA: Diagnosis not present

## 2023-01-06 DIAGNOSIS — M545 Low back pain, unspecified: Secondary | ICD-10-CM | POA: Diagnosis not present

## 2023-01-06 DIAGNOSIS — F332 Major depressive disorder, recurrent severe without psychotic features: Secondary | ICD-10-CM | POA: Diagnosis not present

## 2023-01-06 DIAGNOSIS — F603 Borderline personality disorder: Secondary | ICD-10-CM | POA: Diagnosis not present

## 2023-01-06 DIAGNOSIS — Z978 Presence of other specified devices: Secondary | ICD-10-CM | POA: Diagnosis not present

## 2023-01-06 DIAGNOSIS — F4312 Post-traumatic stress disorder, chronic: Secondary | ICD-10-CM | POA: Diagnosis not present

## 2023-01-13 ENCOUNTER — Other Ambulatory Visit: Payer: Self-pay | Admitting: Family Medicine

## 2023-01-13 ENCOUNTER — Inpatient Hospital Stay: Payer: BC Managed Care – PPO

## 2023-01-19 ENCOUNTER — Other Ambulatory Visit: Payer: Self-pay | Admitting: Family Medicine

## 2023-01-19 MED ORDER — "BD SAFETYGLIDE NEEDLE 18G X 1-1/2"" MISC": 0 refills | Status: DC

## 2023-01-19 MED ORDER — "BD LUER-LOK SYRINGE 21G X 1-1/2"" 3 ML MISC": 0 refills | Status: DC

## 2023-01-25 ENCOUNTER — Telehealth: Payer: Self-pay | Admitting: *Deleted

## 2023-01-25 NOTE — Telephone Encounter (Signed)
Orders faxed to [facility] [address], [place].  Per Dr Assunta Gambles orders and pt request.

## 2023-01-25 NOTE — Telephone Encounter (Signed)
Called asking if his appointment tomorrow was just for labs and said that he gets free labs at [facility]. He would like for [place] to fax an order to [facility] on [facility] and cancel the lab appointment at [facility] for tomorrow

## 2023-01-25 NOTE — Telephone Encounter (Signed)
yes

## 2023-01-26 ENCOUNTER — Inpatient Hospital Stay: Payer: BC Managed Care – PPO

## 2023-01-26 ENCOUNTER — Encounter: Payer: Self-pay | Admitting: Oncology

## 2023-01-26 DIAGNOSIS — D751 Secondary polycythemia: Secondary | ICD-10-CM | POA: Diagnosis not present

## 2023-01-27 ENCOUNTER — Encounter: Payer: Self-pay | Admitting: Oncology

## 2023-01-28 ENCOUNTER — Other Ambulatory Visit: Payer: Self-pay | Admitting: Family Medicine

## 2023-01-29 NOTE — Telephone Encounter (Signed)
LAST APPOINTMENT DATE: 03/09/22   NEXT APPOINTMENT DATE: n/a   LAST REFILL:09/28/22  QTY: #20 w/ 1 refill

## 2023-01-31 NOTE — Telephone Encounter (Signed)
Sent. Thanks.   

## 2023-02-02 ENCOUNTER — Telehealth: Payer: Self-pay

## 2023-02-02 NOTE — Telephone Encounter (Signed)
Called the pt about that hgb 50 and he does not need to get phlebotomy. He does have appt for urology because he wants to go back on testosterone and that will affect the hct. He is aware of that and his next appt with Smith Robert 8/2 10 am and labs on 7/31 at [facility]. He is ok with this

## 2023-02-08 ENCOUNTER — Other Ambulatory Visit: Payer: Self-pay | Admitting: *Deleted

## 2023-02-08 DIAGNOSIS — R972 Elevated prostate specific antigen [PSA]: Secondary | ICD-10-CM

## 2023-02-08 DIAGNOSIS — E230 Hypopituitarism: Secondary | ICD-10-CM

## 2023-02-10 ENCOUNTER — Other Ambulatory Visit: Payer: Medicare Other

## 2023-02-10 DIAGNOSIS — E349 Endocrine disorder, unspecified: Secondary | ICD-10-CM | POA: Diagnosis not present

## 2023-02-10 DIAGNOSIS — E230 Hypopituitarism: Secondary | ICD-10-CM | POA: Diagnosis not present

## 2023-02-10 DIAGNOSIS — R350 Frequency of micturition: Secondary | ICD-10-CM | POA: Diagnosis not present

## 2023-02-10 DIAGNOSIS — R972 Elevated prostate specific antigen [PSA]: Secondary | ICD-10-CM | POA: Diagnosis not present

## 2023-02-11 LAB — HEMOGLOBIN AND HEMATOCRIT, BLOOD
Hematocrit: 46.6 % (ref 37.5–51.0)
Hemoglobin: 14.8 g/dL (ref 13.0–17.7)

## 2023-02-11 LAB — PSA: Prostate Specific Ag, Serum: 2 ng/mL (ref 0.0–4.0)

## 2023-02-11 LAB — TESTOSTERONE: Testosterone: 609 ng/dL (ref 264–916)

## 2023-02-18 ENCOUNTER — Other Ambulatory Visit: Payer: Self-pay | Admitting: Urology

## 2023-02-22 ENCOUNTER — Encounter: Payer: Self-pay | Admitting: Oncology

## 2023-03-03 DIAGNOSIS — F411 Generalized anxiety disorder: Secondary | ICD-10-CM | POA: Diagnosis not present

## 2023-03-03 DIAGNOSIS — F410 Panic disorder [episodic paroxysmal anxiety] without agoraphobia: Secondary | ICD-10-CM | POA: Diagnosis not present

## 2023-03-03 DIAGNOSIS — F4312 Post-traumatic stress disorder, chronic: Secondary | ICD-10-CM | POA: Diagnosis not present

## 2023-03-03 DIAGNOSIS — F603 Borderline personality disorder: Secondary | ICD-10-CM | POA: Diagnosis not present

## 2023-03-04 DIAGNOSIS — M533 Sacrococcygeal disorders, not elsewhere classified: Secondary | ICD-10-CM | POA: Diagnosis not present

## 2023-03-09 ENCOUNTER — Ambulatory Visit (INDEPENDENT_AMBULATORY_CARE_PROVIDER_SITE_OTHER): Payer: BC Managed Care – PPO

## 2023-03-09 VITALS — Ht 65.0 in | Wt 180.0 lb

## 2023-03-09 DIAGNOSIS — Z0000 Encounter for general adult medical examination without abnormal findings: Secondary | ICD-10-CM

## 2023-03-09 NOTE — Patient Instructions (Addendum)
Cody [name] , Thank you for taking time to come for your Medicare Wellness Visit. I appreciate your ongoing commitment to your health goals. Please review the following plan we discussed and let me know if I can assist you in the future.   Referrals/Orders/Follow-Ups/Clinician Recommendations: Aim for 30 minutes of exercise or brisk walking, 6-8 glasses of water, and 5 servings of fruits and vegetables each day.   Managing Pain Without Opioids Opioids are strong medicines used to treat moderate to severe pain. For some people, especially those who have long-term (chronic) pain, opioids may not be the best choice for pain management due to: Side effects like nausea, constipation, and sleepiness. The risk of addiction (opioid use disorder). The longer you take opioids, the greater your risk of addiction. Pain that lasts for more than 3 months is called chronic pain. Managing chronic pain usually requires more than one approach and is often provided by a team of health care providers working together (multidisciplinary approach). Pain management may be done at a pain management center or pain clinic. How to manage pain without the use of opioids Use non-opioid medicines Non-opioid medicines for pain may include: Over-the-counter or prescription non-steroidal anti-inflammatory drugs (NSAIDs). These may be the first medicines used for pain. They work well for muscle and bone pain, and they reduce swelling. Acetaminophen. This over-the-counter medicine may work well for milder pain but not swelling. Antidepressants. These may be used to treat chronic pain. A certain type of antidepressant (tricyclics) is often used. These medicines are given in lower doses for pain than when used for depression. Anticonvulsants. These are usually used to treat seizures but may also reduce nerve (neuropathic) pain. Muscle relaxants. These relieve pain caused by sudden muscle tightening (spasms). You may also use a pain  medicine that is applied to the skin as a patch, cream, or gel (topical analgesic), such as a numbing medicine. These may cause fewer side effects than medicines taken by mouth. Do certain therapies as directed Some therapies can help with pain management. They include: Physical therapy. You will do exercises to gain strength and flexibility. A physical therapist may teach you exercises to move and stretch parts of your body that are weak, stiff, or painful. You can learn these exercises at physical therapy visits and practice them at home. Physical therapy may also involve: Massage. Heat wraps or applying heat or cold to affected areas. Electrical signals that interrupt pain signals (transcutaneous electrical nerve stimulation, TENS). Weak lasers that reduce pain and swelling (low-level laser therapy). Signals from your body that help you learn to regulate pain (biofeedback). Occupational therapy. This helps you to learn ways to function at home and work with less pain. Recreational therapy. This involves trying new activities or hobbies, such as a physical activity or drawing. Mental health therapy, including: Cognitive behavioral therapy (CBT). This helps you learn coping skills for dealing with pain. Acceptance and commitment therapy (ACT) to change the way you think and react to pain. Relaxation therapies, including muscle relaxation exercises and mindfulness-based stress reduction. Pain management counseling. This may be individual, family, or group counseling.  Receive medical treatments Medical treatments for pain management include: Nerve block injections. These may include a pain blocker and anti-inflammatory medicines. You may have injections: Near the spine to relieve chronic back or neck pain. Into joints to relieve back or joint pain. Into nerve areas that supply a painful area to relieve body pain. Into muscles (trigger point injections) to relieve some painful muscle  conditions. A medical device placed near your spine to help block pain signals and relieve nerve pain or chronic back pain (spinal cord stimulation device). Acupuncture. Follow these instructions at home Medicines Take over-the-counter and prescription medicines only as told by your health care provider. If you are taking pain medicine, ask your health care providers about possible side effects to watch out for. Do not drive or use heavy machinery while taking prescription opioid pain medicine. Lifestyle  Do not use drugs or alcohol to reduce pain. If you drink alcohol, limit how much you have to: 0-1 drink a day for women who are not pregnant. 0-2 drinks a day for men. Know how much alcohol is in a drink. In the [place] one drink equals one 12 oz bottle of beer (355 mL), one 5 oz glass of wine (148 mL), or one 1 oz glass of hard liquor (44 mL). Do not use any products that contain nicotine or tobacco. These products include cigarettes, chewing tobacco, and vaping devices, such as e-cigarettes. If you need help quitting, ask your health care provider. Eat a healthy diet and maintain a healthy weight. Poor diet and excess weight may make pain worse. Eat foods that are high in fiber. These include fresh fruits and vegetables, whole grains, and beans. Limit foods that are high in fat and processed sugars, such as fried and sweet foods. Exercise regularly. Exercise lowers stress and may help relieve pain. Ask your health care provider what activities and exercises are safe for you. If your health care provider approves, join an exercise class that combines movement and stress reduction. Examples include yoga and tai chi. Get enough sleep. Lack of sleep may make pain worse. Lower stress as much as possible. Practice stress reduction techniques as told by your therapist. General instructions Work with all your pain management providers to find the treatments that work best for you. You are an  important member of your pain management team. There are many things you can do to reduce pain on your own. Consider joining an online or in-person support group for people who have chronic pain. Keep all follow-up visits. This is important. Where to find more information You can find more information about managing pain without opioids from: American Academy of Pain Medicine: painmed.org Institute for Chronic Pain: instituteforchronicpain.org American Chronic Pain Association: theacpa.org Contact a health care provider if: You have side effects from pain medicine. Your pain gets worse or does not get better with treatments or home therapy. You are struggling with anxiety or depression. Summary Many types of pain can be managed without opioids. Chronic pain may respond better to pain management without opioids. Pain is best managed when you and a team of health care providers work together. Pain management without opioids may include non-opioid medicines, medical treatments, physical therapy, mental health therapy, and lifestyle changes. Tell your health care providers if your pain gets worse or is not being managed well enough. This information is not intended to replace advice given to you by your health care provider. Make sure you discuss any questions you have with your health care provider. Document Revised: 11/06/2020 Document Reviewed: 11/06/2020 Elsevier Patient Education  2024 [employer]   This is a list of the screening recommended for you and due dates:  Health Maintenance  Topic Date Due   DTaP/Tdap/Td vaccine (1 - Tdap) Never done   Zoster (Shingles) Vaccine (1 of 2) Never done   COVID-19 Vaccine (4 - 2023-24 season) 04/10/2022  Medicare Annual Wellness Visit  03/04/2023   Flu Shot  03/11/2023   Colon Cancer Screening  09/09/2032   Hepatitis C Screening  Completed   HIV Screening  Completed   HPV Vaccine  Aged Out    Advanced directives: (Declined) Advance  directive discussed with you today. Even though you declined this today, please call our office should you change your mind, and we can give you the proper paperwork for you to fill out.  Next Medicare Annual Wellness Visit scheduled for next year: Yes  Preventive Care 40-64 Years, Male Preventive care refers to lifestyle choices and visits with your health care provider that can promote health and wellness. What does preventive care include? A yearly physical exam. This is also called an annual well check. Dental exams once or twice a year. Routine eye exams. Ask your health care provider how often you should have your eyes checked. Personal lifestyle choices, including: Daily care of your teeth and gums. Regular physical activity. Eating a healthy diet. Avoiding tobacco and drug use. Limiting alcohol use. Practicing safe sex. Taking low-dose aspirin every day starting at age 78. What happens during an annual well check? The services and screenings done by your health care provider during your annual well check will depend on your age, overall health, lifestyle risk factors, and family history of disease. Counseling  Your health care provider may ask you questions about your: Alcohol use. Tobacco use. Drug use. Emotional well-being. Home and relationship well-being. Sexual activity. Eating habits. Work and work Astronomer. Screening  You may have the following tests or measurements: Height, weight, and BMI. Blood pressure. Lipid and cholesterol levels. These may be checked every 5 years, or more frequently if you are over 65 years old. Skin check. Lung cancer screening. You may have this screening every year starting at age 81 if you have a 30-pack-year history of smoking and currently smoke or have quit within the past 15 years. Fecal occult blood test (FOBT) of the stool. You may have this test every year starting at age 35. Flexible sigmoidoscopy or colonoscopy. You may  have a sigmoidoscopy every 5 years or a colonoscopy every 10 years starting at age 49. Prostate cancer screening. Recommendations will vary depending on your family history and other risks. Hepatitis C blood test. Hepatitis B blood test. Sexually transmitted disease (STD) testing. Diabetes screening. This is done by checking your blood sugar (glucose) after you have not eaten for a while (fasting). You may have this done every 1-3 years. Discuss your test results, treatment options, and if necessary, the need for more tests with your health care provider. Vaccines  Your health care provider may recommend certain vaccines, such as: Influenza vaccine. This is recommended every year. Tetanus, diphtheria, and acellular pertussis (Tdap, Td) vaccine. You may need a Td booster every 10 years. Zoster vaccine. You may need this after age 50. Pneumococcal 13-valent conjugate (PCV13) vaccine. You may need this if you have certain conditions and have not been vaccinated. Pneumococcal polysaccharide (PPSV23) vaccine. You may need one or two doses if you smoke cigarettes or if you have certain conditions. Talk to your health care provider about which screenings and vaccines you need and how often you need them. This information is not intended to replace advice given to you by your health care provider. Make sure you discuss any questions you have with your health care provider. Document Released: 08/23/2015 Document Revised: 04/15/2016 Document Reviewed: 05/28/2015 Elsevier Interactive Patient Education  2017 [employer]  Inc[employer]  Fall Prevention in the Home Falls can cause injuries. They can happen to people of all ages. There are many things you can do to make your home safe and to help prevent falls. What can I do on the outside of my home? Regularly fix the edges of walkways and driveways and fix any cracks. Remove anything that might make you trip as you walk through a door, such as a raised step or  threshold. Trim any bushes or trees on the path to your home. Use bright outdoor lighting. Clear any walking paths of anything that might make someone trip, such as rocks or tools. Regularly check to see if handrails are loose or broken. Make sure that both sides of any steps have handrails. Any raised decks and porches should have guardrails on the edges. Have any leaves, snow, or ice cleared regularly. Use sand or salt on walking paths during winter. Clean up any spills in your garage right away. This includes oil or grease spills. What can I do in the bathroom? Use night lights. Install grab bars by the toilet and in the tub and shower. Do not use towel bars as grab bars. Use non-skid mats or decals in the tub or shower. If you need to sit down in the shower, use a plastic, non-slip stool. Keep the floor dry. Clean up any water that spills on the floor as soon as it happens. Remove soap buildup in the tub or shower regularly. Attach bath mats securely with double-sided non-slip rug tape. Do not have throw rugs and other things on the floor that can make you trip. What can I do in the bedroom? Use night lights. Make sure that you have a light by your bed that is easy to reach. Do not use any sheets or blankets that are too big for your bed. They should not hang down onto the floor. Have a firm chair that has side arms. You can use this for support while you get dressed. Do not have throw rugs and other things on the floor that can make you trip. What can I do in the kitchen? Clean up any spills right away. Avoid walking on wet floors. Keep items that you use a lot in easy-to-reach places. If you need to reach something above you, use a strong step stool that has a grab bar. Keep electrical cords out of the way. Do not use floor polish or wax that makes floors slippery. If you must use wax, use non-skid floor wax. Do not have throw rugs and other things on the floor that can make you  trip. What can I do with my stairs? Do not leave any items on the stairs. Make sure that there are handrails on both sides of the stairs and use them. Fix handrails that are broken or loose. Make sure that handrails are as long as the stairways. Check any carpeting to make sure that it is firmly attached to the stairs. Fix any carpet that is loose or worn. Avoid having throw rugs at the top or bottom of the stairs. If you do have throw rugs, attach them to the floor with carpet tape. Make sure that you have a light switch at the top of the stairs and the bottom of the stairs. If you do not have them, ask someone to add them for you. What else can I do to help prevent falls? Wear shoes that: Do not have high heels. Have rubber bottoms. Are comfortable  and fit you well. Are closed at the toe. Do not wear sandals. If you use a stepladder: Make sure that it is fully opened. Do not climb a closed stepladder. Make sure that both sides of the stepladder are locked into place. Ask someone to hold it for you, if possible. Clearly mark and make sure that you can see: Any grab bars or handrails. First and last steps. Where the edge of each step is. Use tools that help you move around (mobility aids) if they are needed. These include: Canes. Walkers. Scooters. Crutches. Turn on the lights when you go into a dark area. Replace any light bulbs as soon as they burn out. Set up your furniture so you have a clear path. Avoid moving your furniture around. If any of your floors are uneven, fix them. If there are any pets around you, be aware of where they are. Review your medicines with your doctor. Some medicines can make you feel dizzy. This can increase your chance of falling. Ask your doctor what other things that you can do to help prevent falls. This information is not intended to replace advice given to you by your health care provider. Make sure you discuss any questions you have with your  health care provider. Document Released: 05/23/2009 Document Revised: 01/02/2016 Document Reviewed: 08/31/2014 Elsevier Interactive Patient Education  2017 [employer].

## 2023-03-09 NOTE — Progress Notes (Signed)
Subjective:   Cody [name] is a 52 y.o. male who presents for Medicare Annual/Subsequent preventive examination.  Visit Complete: Virtual  I connected with  Cody [name] on 03/09/23 by a audio enabled telemedicine application and verified that I am speaking with the correct person using two identifiers.  Patient Location: Home  Provider Location: Home Office  I discussed the limitations of evaluation and management by telemedicine. The patient expressed understanding and agreed to proceed.  Vital Signs: Unable to obtain new vitals due to this being a telehealth visit.   Review of Systems      Cardiac Risk Factors include: male gender;hypertension;dyslipidemia;smoking/ tobacco exposure;obesity (BMI >30kg/m2)     Objective:    Today's Vitals   03/09/23 1418 03/09/23 1422  Weight:  180 lb (81.6 kg)  Height:  5\' 5"  (1.651 m)  PainSc: 5     Body mass index is 29.95 kg/m.     03/09/2023    2:37 PM 12/09/2022   10:02 AM 11/23/2022   10:56 AM 03/03/2022   11:57 AM 02/03/2022    5:48 PM 02/03/2022   12:30 PM 01/28/2022   11:35 AM  Advanced Directives  Does Patient Have a Medical Advance Directive? No No No No No No No  Would patient like information on creating a medical advance directive? No - Patient declined No - Patient declined No - Patient declined No - Patient declined No - Patient declined No - Patient declined No - Patient declined    Current Medications (verified) Outpatient Encounter Medications as of 03/09/2023  Medication Sig   ALPRAZolam (XANAX) 1 MG tablet PLEASE SEE ATTACHED FOR DETAILED DIRECTIONS   AMBULATORY NON FORMULARY MEDICATION 8.608 mg by Intrathecal Infusion route daily. Morphine Pump Implant   Atogepant (QULIPTA) 60 MG TABS Take 1 tablet (60 mg total) by mouth daily.   atorvastatin (LIPITOR) 10 MG tablet TAKE 1 TABLET BY MOUTH DAILY   buPROPion (WELLBUTRIN XL) 150 MG 24 hr tablet Take 150 mg by mouth every morning.   busPIRone (BUSPAR) 10 MG tablet  Take 1 tablet (10 mg total) by mouth 2 (two) times daily.   CAPLYTA 42 MG capsule Take 42 mg by mouth daily.   Cholecalciferol (VITAMIN D) 50 MCG (2000 UT) CAPS Take 1 capsule by mouth daily.   lamoTRIgine (LAMICTAL) 100 MG tablet Take 100 mg by mouth 2 (two) times daily.   levothyroxine (SYNTHROID) 75 MCG tablet TAKE 1 TABLET BY MOUTH DAILY  BEFORE BREAKFAST   NEEDLE, DISP, 18 G (BD SAFETYGLIDE NEEDLE) 18G X 1-1/2" MISC Draw up   NUCYNTA 100 MG TABS Take 1 tablet by mouth every 6 (six) hours as needed.   polyethylene glycol powder (GLYCOLAX/MIRALAX) 17 GM/SCOOP powder Take 17 g by mouth every other day as needed.   promethazine (PHENERGAN) 25 MG tablet TAKE 1/2 TO 1 TABLET BY MOUTH EVERY 8 HOURS AS NEEDED FOR NAUSEA AND VOMITING   SUMAtriptan 6 MG/0.5ML SOAJ Inject 6 mg into the skin daily as needed (with 2nd dose 2 hours after 1st dose if needed.).   SYRINGE-NEEDLE, DISP, 3 ML (B-D 3CC LUER-LOK SYR 21GX1-1/2) 21G X 1-1/2" 3 ML MISC Use this needle to injection   tadalafil (CIALIS) 5 MG tablet Take 1 tablet (5 mg total) by mouth daily as needed for erectile dysfunction.   tamsulosin (FLOMAX) 0.4 MG CAPS capsule TAKE 1 CAPSULE BY MOUTH DAILY  AFTER BREAKFAST   testosterone cypionate (DEPOTESTOSTERONE CYPIONATE) 200 MG/ML injection Inject 0.5 mLs (100 mg total)  into the muscle every 14 (fourteen) days.   tiZANidine (ZANAFLEX) 4 MG tablet Take 4 mg by mouth as needed.   topiramate (TOPAMAX) 50 MG tablet Take 50 mg by mouth daily.   vortioxetine HBr (TRINTELLIX) 10 MG TABS tablet Take 10 mg by mouth daily.   Galcanezumab-gnlm (EMGALITY) 120 MG/ML SOAJ Inject 120 mg into the skin every 30 (thirty) days. (Patient not taking: Reported on 03/09/2023)   hydrOXYzine (ATARAX) 50 MG tablet Take 1 tablet (50 mg total) by mouth in the morning and at bedtime. BID (Patient not taking: Reported on 03/09/2023)   Omega-3 1000 MG CAPS Take 2 each by mouth daily. (Patient not taking: Reported on 03/09/2023)   OVER THE  COUNTER MEDICATION Take 1 Piece by mouth daily. CBD gummy (contains small amount of THC) (Patient not taking: Reported on 03/09/2023)   No facility-administered encounter medications on file as of 03/09/2023.    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; herniated disc L5- S1   Degenerative disk disease    Depression    Fracture, mandible (HCC)    Fracture, tibia    Headache    Hyperlipidemia    Hypertension    resolved   Hypothyroidism 05/2007   Dr Marga Melnick (retired)/ Dr Crawford Givens   Neuromuscular disorder [facility])    PTSD (post-traumatic stress disorder)    Suicidal behavior    a.) ideations with attempt x 1   Past Surgical History:  Procedure Laterality Date   APPENDECTOMY     peritonitis   BACK SURGERY     EVALUATION UNDER ANESTHESIA WITH HEMORRHOIDECTOMY N/A 01/28/2022   Procedure: EXAM UNDER ANESTHESIA WITH HEMORRHOIDECTOMY;  Surgeon: Campbell Lerner, MD;  Location: [facility] ORS;  Service: General;  Laterality: N/A;   FRACTURE SURGERY     INTRATHECAL PUMP IMPLANTATION  2007   LAMINECTOMY  2004   arterial injury, transfused 7 units pc, implant surgery    MANDIBLE FRACTURE SURGERY     mugged   THORACIC DISCECTOMY     T10   UPPER GASTROINTESTINAL ENDOSCOPY  2006   gastritis   Family History  Problem Relation Age of Onset   Ulcers Mother    Depression Mother    Thyroid disease Mother        hypo   Stroke Father 26   Thyroid disease Father        hypo   Esophageal cancer Maternal Aunt    Lung cancer Maternal Grandmother    Thyroid disease Maternal Grandmother         hypo   Colon cancer Maternal Grandfather    Heart attack Maternal Grandfather 55   Esophageal cancer Maternal Grandfather    Diabetes Neg Hx    Prostate cancer Neg Hx    Kidney cancer Neg Hx    Bladder Cancer Neg Hx    Migraines Neg Hx    Headache Neg Hx    Colon polyps Neg Hx    Rectal cancer Neg Hx    Stomach cancer Neg Hx    Social History  Socioeconomic History   Marital status: Married    Spouse name: [name]   Number of children: 0   Years of education: Not on file   Highest education level: Not on file  Occupational History   Occupation: Disabled  Tobacco Use   Smoking status: Former    Current packs/day: 0.00    Types: E-cigarettes, Cigarettes    Quit date: 08/10/2009    Years since quitting: 13.5    Passive exposure: Never   Smokeless tobacco: Never   Tobacco comments:    smoked age intermittently age [age]; up to 2 cigarettes/ day   Vaping Use   Vaping status: Every Day   Substances: Nicotine, Flavoring  Substance and Sexual Activity   Alcohol use: No   Drug use: No   Sexual activity: Not on file  Other Topics Concern   Not on file  Social History Narrative   From [place].     [address] Grad   To [place] 2000   Disability from bipolar affective disorder, prev accounting.     Married 1997   No kids   Enjoys walking, travel.     Social Determinants of Health   Financial Resource Strain: Low Risk  (03/09/2023)   Overall Financial Resource Strain (CARDIA)    Difficulty of Paying Living Expenses: Not hard at all  Food Insecurity: No Food Insecurity (03/09/2023)   Hunger Vital Sign    Worried About Running Out of Food in the Last Year: Never true    Ran Out of Food in the Last Year: Never true  Transportation Needs: No Transportation Needs (03/09/2023)   PRAPARE - Administrator, Civil Service (Medical): No    Lack of Transportation (Non-Medical): No  Physical Activity: Insufficiently Active (03/09/2023)   Exercise Vital Sign    Days  of Exercise per Week: 2 days    Minutes of Exercise per Session: 40 min  Stress: No Stress Concern Present (03/09/2023)   [employer] of Occupational Health - Occupational Stress Questionnaire    Feeling of Stress : Not at all  Social Connections: Moderately Integrated (03/09/2023)   Social Connection and Isolation Panel [NHANES]    Frequency of Communication with Friends and Family: Never    Frequency of Social Gatherings with Friends and Family: Never    Attends Religious Services: More than 4 times per year    Active Member of [employer] or Organizations: Yes    Attends Engineer, structural: More than 4 times per year    Marital Status: Married    Tobacco Counseling Counseling given: Not Answered Tobacco comments: smoked age intermittently age [age]-36;[age]; up to 2 cigarettes/ day    Clinical Intake:  Pre-visit preparation completed: Yes  Pain : 0-10 Pain Score: 5  Pain Type: Chronic pain Pain Location: Back Pain Orientation: Medial, Lower Pain Descriptors / Indicators: Aching, Shooting, Throbbing Pain Onset: Other (comment) (years)     BMI - recorded: 29.95 Nutritional Status: BMI 25 -29 Overweight Nutritional Risks: None Diabetes: No  How often do you need to have someone help you when you read instructions, pamphlets, or other written materials from your doctor or pharmacy?: 2 - Rarely  Interpreter Needed?: No  Information entered by :: C.Abelardo Seidner LPN   Activities of Daily Living    03/09/2023    2:18 PM  In your present state of health, do you have any difficulty performing the following activities:  Hearing? 0  Vision? 0  Difficulty concentrating or making decisions? 1  Comment due to medication  Walking or climbing stairs? 1  Comment due to pain  Dressing or bathing? 0  Doing errands, shopping? 1  Comment not all the time, just on occasion  Preparing Food and eating ? N  Using the Toilet? N  In the past six months, have you accidently  leaked urine? N  Do you have problems with loss of bowel control? N  Managing your Medications? N  Managing your Finances? N  Housekeeping or managing your Housekeeping? N    Patient Care Team: Joaquim Nam, MD as PCP - General (Family Medicine) Darliss Ridgel, MD as Referring Physician (Psychiatry) Salena Saner, MD as Referring Physician (Pain Medicine)  Indicate any recent Medical Services you may have received from other than [facility] providers in the past year (date may be approximate).     Assessment:   This is a routine wellness examination for Cody [name].  Hearing/Vision screen Hearing Screening - Comments:: Denies hearing difficulties   Vision Screening - Comments:: Glasses - Unknown Provider - UTD on eye exams  Dietary issues and exercise activities discussed:     Goals Addressed             This Visit's Progress    Patient Stated       Start Physical therapy       Depression Screen    03/09/2023    2:33 PM 11/23/2022    1:17 PM 03/03/2022   11:54 AM 06/13/2020   12:37 PM 02/28/2018   12:30 PM 03/09/2017   12:46 PM  PHQ 2/9 Scores  PHQ - 2 Score 5 0 0 0 6 6  PHQ- 9 Score 12  1  14      Fall Risk    03/09/2023    2:18 PM 03/03/2022   11:57 AM 06/13/2020   12:37 PM 02/28/2018   12:30 PM  Fall Risk   Falls in the past year? 0 1 0 No  Number falls in past yr: 0 0 0   Injury with Fall? 0 0    Risk for fall due to : No Fall Risks History of fall(s)    Follow up Falls evaluation completed;Education provided;Falls prevention discussed Falls prevention discussed Falls evaluation completed     MEDICARE RISK AT HOME:   TIMED UP AND GO:  Was the test performed?  No    Cognitive Function:    02/28/2018   12:15 PM  MMSE - Mini Mental State Exam  Orientation to time 5  Orientation to Place 5  Registration 3  Attention/ Calculation 0  Recall 3  Language- name 2 objects 0  Language- repeat 1  Language- follow 3 step command 3  Language- read & follow  direction 0  Write a sentence 0  Copy design 0  Total score 20        03/09/2023    2:41 PM 03/03/2022   12:05 PM  6CIT Screen  What Year? 0 points 0 points  What month? 0 points 0 points  What time? 0 points 0 points  Count back from 20 0 points 0 points  Months in reverse 0 points 0 points  Repeat phrase 0 points 0 points  Total Score 0 points 0 points    Immunizations Immunization History  Administered Date(s) Administered   Influenza Whole 05/01/2010   Influenza,inj,Quad PF,6+ Mos 04/25/2018, 06/24/2019, 05/18/2021   Influenza-Unspecified 05/14/2015, 05/24/2020   PFIZER(Purple Top)SARS-COV-2 Vaccination 11/08/2019, 11/28/2019, 07/10/2020    TDAP status: Due, Education has  been provided regarding the importance of this vaccine. Advised may receive this vaccine at local pharmacy or Health Dept. Aware to provide a copy of the vaccination record if obtained from local pharmacy or Health Dept. Verbalized acceptance and understanding.  Flu Vaccine status: Due, Education has been provided regarding the importance of this vaccine. Advised may receive this vaccine at local pharmacy or Health Dept. Aware to provide a copy of the vaccination record if obtained from local pharmacy or Health Dept. Verbalized acceptance and understanding.  Pneumococcal vaccine status: Due, Education has been provided regarding the importance of this vaccine. Advised may receive this vaccine at local pharmacy or Health Dept. Aware to provide a copy of the vaccination record if obtained from local pharmacy or Health Dept. Verbalized acceptance and understanding.  Covid-19 vaccine status: Information provided on how to obtain vaccines.   Qualifies for Shingles Vaccine? Yes   Zostavax completed No   Shingrix Completed?: No.    Education has been provided regarding the importance of this vaccine. Patient has been advised to call insurance company to determine out of pocket expense if they have not yet received  this vaccine. Advised may also receive vaccine at local pharmacy or Health Dept. Verbalized acceptance and understanding.  Screening Tests Health Maintenance  Topic Date Due   DTaP/Tdap/Td (1 - Tdap) Never done   Zoster Vaccines- Shingrix (1 of 2) Never done   COVID-19 Vaccine (4 - 2023-24 season) 04/10/2022   Medicare Annual Wellness (AWV)  03/04/2023   INFLUENZA VACCINE  03/11/2023   Colonoscopy  09/09/2032   Hepatitis C Screening  Completed   HIV Screening  Completed   HPV VACCINES  Aged Out    Health Maintenance  Health Maintenance Due  Topic Date Due   DTaP/Tdap/Td (1 - Tdap) Never done   Zoster Vaccines- Shingrix (1 of 2) Never done   COVID-19 Vaccine (4 - 2023-24 season) 04/10/2022   Medicare Annual Wellness (AWV)  03/04/2023    Colorectal cancer screening: Type of screening: Colonoscopy. Completed 09/09/22. Repeat every 10 years  Lung Cancer Screening: (Low Dose CT Chest recommended if Age 58-80 years, 20 pack-year currently smoking OR have quit w/in 15years.) does not qualify.   Lung Cancer Screening Referral: no  Additional Screening:  Hepatitis C Screening: does qualify; Completed 07/23/21  Vision Screening: Recommended annual ophthalmology exams for early detection of glaucoma and other disorders of the eye. Is the patient up to date with their annual eye exam?  Yes  Who is the provider or what is the name of the office in which the patient attends annual eye exams? Unknown provider If pt is not established with a provider, would they like to be referred to a provider to establish care? No .   Dental Screening: Recommended annual dental exams for proper oral hygiene    Community Resource Referral / Chronic Care Management: CRR required this visit?  No   CCM required this visit?  No     Plan:     I have personally reviewed and noted the following in the patient's chart:   Medical and social history Use of alcohol, tobacco or illicit drugs  Current  medications and supplements including opioid prescriptions. Patient is currently taking opioid prescriptions. Information provided to patient regarding non-opioid alternatives. Patient advised to discuss non-opioid treatment plan with their provider. Functional ability and status Nutritional status Physical activity Advanced directives List of other physicians Hospitalizations, surgeries, and ER visits in previous 12 months Vitals Screenings to include  cognitive, depression, and falls Referrals and appointments  In addition, I have reviewed and discussed with patient certain preventive protocols, quality metrics, and best practice recommendations. A written personalized care plan for preventive services as well as general preventive health recommendations were provided to patient.     Maryan Puls, LPN   [id number]/47/[id number]   After Visit Summary: (MyChart) Due to this being a telephonic visit, the after visit summary with patients personalized plan was offered to patient via MyChart   Nurse Notes: none

## 2023-03-11 DIAGNOSIS — D751 Secondary polycythemia: Secondary | ICD-10-CM | POA: Diagnosis not present

## 2023-03-12 ENCOUNTER — Ambulatory Visit: Payer: BC Managed Care – PPO | Admitting: Oncology

## 2023-03-12 ENCOUNTER — Encounter: Payer: Self-pay | Admitting: Oncology

## 2023-03-19 ENCOUNTER — Inpatient Hospital Stay: Payer: BC Managed Care – PPO | Admitting: Oncology

## 2023-03-23 ENCOUNTER — Encounter: Payer: Self-pay | Admitting: Oncology

## 2023-03-23 ENCOUNTER — Inpatient Hospital Stay: Payer: BC Managed Care – PPO | Attending: Oncology | Admitting: Oncology

## 2023-03-23 VITALS — BP 132/100 | HR 67 | Temp 98.3°F | Resp 18 | Ht 65.0 in | Wt 191.5 lb

## 2023-03-23 DIAGNOSIS — Z87891 Personal history of nicotine dependence: Secondary | ICD-10-CM | POA: Diagnosis not present

## 2023-03-23 DIAGNOSIS — E291 Testicular hypofunction: Secondary | ICD-10-CM | POA: Insufficient documentation

## 2023-03-23 DIAGNOSIS — G4733 Obstructive sleep apnea (adult) (pediatric): Secondary | ICD-10-CM | POA: Diagnosis not present

## 2023-03-23 DIAGNOSIS — D751 Secondary polycythemia: Secondary | ICD-10-CM | POA: Diagnosis present

## 2023-03-23 DIAGNOSIS — E039 Hypothyroidism, unspecified: Secondary | ICD-10-CM | POA: Diagnosis not present

## 2023-03-23 DIAGNOSIS — Z79890 Hormone replacement therapy (postmenopausal): Secondary | ICD-10-CM | POA: Insufficient documentation

## 2023-03-23 NOTE — Progress Notes (Signed)
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  He had urine analysis done February 2024 which did not show any evidence of hematuria.  He is on testosterone supplementation for hypogonadism through urology.  Testosterone levels on 11/17/2022 were low at 126 after they were elevated at 1498 in December 2023.   Patient does not smoke cigarettes but does vape.  He was told that he has obstructive sleep apnea in the past but he did not use CPAP.  Currently he reports sleeping well.  He is on testosterone supplementation for the last 1 year or so.  This is being given for opioid-induced hypogonadism.   Results of workup fromApril 2024 showed negative JAK2 mutation and normal EPO levels.  Polycythemia likely secondary due to testosterone replacement therapy.  Target hematocrit less than 50  Interval history-no acute issues sinceLast visit.  He is on testosterone replacement by urology  ECOG PS- 1 Pain scale- 3   Review of systems- Review of Systems  Constitutional:  Negative for chills, fever, malaise/fatigue and weight loss.   HENT:  Negative for congestion, ear discharge and nosebleeds.   Eyes:  Negative for blurred vision.  Respiratory:  Negative for cough, hemoptysis, sputum production, shortness of breath and wheezing.   Cardiovascular:  Negative for chest pain, palpitations, orthopnea and claudication.  Gastrointestinal:  Negative for abdominal pain, blood in stool, constipation, diarrhea, heartburn, melena, nausea and vomiting.  Genitourinary:  Negative for dysuria, flank pain, frequency, hematuria and urgency.  Musculoskeletal:  Negative for back pain, joint pain and myalgias.  Skin:  Negative for rash.  Neurological:  Negative for dizziness, tingling, focal weakness, seizures, weakness and headaches.  Endo/Heme/Allergies:  Does not bruise/bleed easily.  Psychiatric/Behavioral:  Negative for depression and suicidal ideas. The patient does not have insomnia.       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; herniated disc L5- S1   Degenerative disk disease    Depression    Fracture, mandible (HCC)    Fracture, tibia    Headache    Hyperlipidemia  Hypertension    resolved   Hypothyroidism 05/2007   Dr Marga Melnick (retired)/ Dr Crawford Givens   Neuromuscular disorder [facility])    PTSD (post-traumatic stress disorder)    Suicidal behavior    a.) ideations with attempt x 1     Past Surgical History:  Procedure Laterality Date   APPENDECTOMY     peritonitis   BACK SURGERY     EVALUATION UNDER  ANESTHESIA WITH HEMORRHOIDECTOMY N/A 01/28/2022   Procedure: EXAM UNDER ANESTHESIA WITH HEMORRHOIDECTOMY;  Surgeon: Campbell Lerner, MD;  Location: [facility] ORS;  Service: General;  Laterality: N/A;   FRACTURE SURGERY     INTRATHECAL PUMP IMPLANTATION  2007   LAMINECTOMY  2004   arterial injury, transfused 7 units pc, implant surgery    MANDIBLE FRACTURE SURGERY     mugged   THORACIC DISCECTOMY     T10   UPPER GASTROINTESTINAL ENDOSCOPY  2006   gastritis    Social History   Socioeconomic History   Marital status: Married    Spouse name: Research scientist (physical sciences)   Number of children: 0   Years of education: Not on file   Highest education level: Not on file  Occupational History   Occupation: Disabled  Tobacco Use   Smoking status: Former    Current packs/day: 0.00    Types: E-cigarettes, Cigarettes    Quit date: 08/10/2009    Years since quitting: 13.6    Passive exposure: Never   Smokeless tobacco: Never   Tobacco comments:    smoked age intermittently age [age]; up to 2 cigarettes/ day   Vaping Use   Vaping status: Every Day   Substances: Nicotine, Flavoring  Substance and Sexual Activity   Alcohol use: No   Drug use: No   Sexual activity: Not on file  Other Topics Concern   Not on file  Social History Narrative   From [place].     [address] Grad   To [place] 2000   Disability from bipolar affective disorder, prev accounting.     Married 1997   No kids   Enjoys walking, travel.     Social Determinants of Health   Financial Resource Strain: Low Risk  (03/09/2023)   Overall Financial Resource Strain (CARDIA)    Difficulty of Paying Living Expenses: Not hard at all  Food Insecurity: No Food Insecurity (03/09/2023)   Hunger Vital Sign    Worried About Running Out of Food in the Last Year: Never true    Ran Out of Food in the Last Year: Never true  Transportation Needs: No Transportation Needs (03/09/2023)   PRAPARE - Administrator, Civil Service (Medical): No     Lack of Transportation (Non-Medical): No  Physical Activity: Insufficiently Active (03/09/2023)   Exercise Vital Sign    Days of Exercise per Week: 2 days    Minutes of Exercise per Session: 40 min  Stress: No Stress Concern Present (03/09/2023)   [employer] of Occupational Health - Occupational Stress Questionnaire    Feeling of Stress : Not at all  Social Connections: Moderately Integrated (03/09/2023)   Social Connection and Isolation Panel [NHANES]    Frequency of Communication with Friends and Family: Never    Frequency of Social Gatherings with Friends and Family: Never    Attends Religious Services: More than 4 times per year    Active Member of [employer] or Organizations: Yes    Attends Banker Meetings: More than 4 times per year  Marital Status: Married  Catering manager Violence: Not At Risk (03/09/2023)   Humiliation, Afraid, Rape, and Kick questionnaire    Fear of Current or Ex-Partner: No    Emotionally Abused: No    Physically Abused: No    Sexually Abused: No    Family History  Problem Relation Age of Onset   Ulcers Mother    Depression Mother    Thyroid disease Mother        hypo   Stroke Father 61   Thyroid disease Father        hypo   Esophageal cancer Maternal Aunt    Lung cancer Maternal Grandmother    Thyroid disease Maternal Grandmother        hypo   Colon cancer Maternal Grandfather    Heart attack Maternal Grandfather 55   Esophageal cancer Maternal Grandfather    Diabetes Neg Hx    Prostate cancer Neg Hx    Kidney cancer Neg Hx    Bladder Cancer Neg Hx    Migraines Neg Hx    Headache Neg Hx    Colon polyps Neg Hx    Rectal cancer Neg Hx    Stomach cancer Neg Hx      Current Outpatient Medications:    ALPRAZolam (XANAX) 1 MG tablet, PLEASE SEE ATTACHED FOR DETAILED DIRECTIONS, Disp: , Rfl:    AMBULATORY NON FORMULARY MEDICATION, 8.608 mg by Intrathecal Infusion route daily. Morphine Pump Implant, Disp: , Rfl:    Atogepant  (QULIPTA) 60 MG TABS, Take 1 tablet (60 mg total) by mouth daily., Disp: 90 tablet, Rfl: 3   atorvastatin (LIPITOR) 10 MG tablet, TAKE 1 TABLET BY MOUTH DAILY, Disp: 90 tablet, Rfl: 3   buPROPion (WELLBUTRIN XL) 150 MG 24 hr tablet, Take 150 mg by mouth every morning., Disp: , Rfl:    busPIRone (BUSPAR) 10 MG tablet, Take 1 tablet (10 mg total) by mouth 2 (two) times daily., Disp: , Rfl:    CAPLYTA 42 MG capsule, Take 42 mg by mouth daily., Disp: , Rfl:    Cholecalciferol (VITAMIN D) 50 MCG (2000 UT) CAPS, Take 1 capsule by mouth daily., Disp: , Rfl:    lamoTRIgine (LAMICTAL) 100 MG tablet, Take 100 mg by mouth 2 (two) times daily., Disp: , Rfl:    levothyroxine (SYNTHROID) 75 MCG tablet, TAKE 1 TABLET BY MOUTH DAILY  BEFORE BREAKFAST, Disp: 90 tablet, Rfl: 3   NEEDLE, DISP, 18 G (BD SAFETYGLIDE NEEDLE) 18G X 1-1/2" MISC, Draw up, Disp: 50 each, Rfl: 0   NUCYNTA 100 MG TABS, Take 1 tablet by mouth every 6 (six) hours as needed., Disp: , Rfl:    polyethylene glycol powder (GLYCOLAX/MIRALAX) 17 GM/SCOOP powder, Take 17 g by mouth every other day as needed., Disp: , Rfl:    promethazine (PHENERGAN) 25 MG tablet, TAKE 1/2 TO 1 TABLET BY MOUTH EVERY 8 HOURS AS NEEDED FOR NAUSEA AND VOMITING, Disp: 20 tablet, Rfl: 1   SUMAtriptan 6 MG/0.5ML SOAJ, Inject 6 mg into the skin daily as needed (with 2nd dose 2 hours after 1st dose if needed.)., Disp: 5 mL, Rfl: 5   SYRINGE-NEEDLE, DISP, 3 ML (B-D 3CC LUER-LOK SYR 21GX1-1/2) 21G X 1-1/2" 3 ML MISC, Use this needle to injection, Disp: 50 each, Rfl: 0   tadalafil (CIALIS) 5 MG tablet, Take 1 tablet (5 mg total) by mouth daily as needed for erectile dysfunction., Disp: 90 tablet, Rfl: 0   tamsulosin (FLOMAX) 0.4 MG CAPS capsule, TAKE 1 CAPSULE BY  MOUTH DAILY  AFTER BREAKFAST, Disp: 90 capsule, Rfl: 3   testosterone cypionate (DEPOTESTOSTERONE CYPIONATE) 200 MG/ML injection, Inject 0.5 mLs (100 mg total) into the muscle every 14 (fourteen) days., Disp: 10 mL, Rfl:  0   tiZANidine (ZANAFLEX) 4 MG tablet, Take 4 mg by mouth as needed., Disp: , Rfl:    topiramate (TOPAMAX) 50 MG tablet, Take 50 mg by mouth daily., Disp: , Rfl:    vortioxetine HBr (TRINTELLIX) 10 MG TABS tablet, Take 10 mg by mouth daily., Disp: , Rfl:    Galcanezumab-gnlm (EMGALITY) 120 MG/ML SOAJ, Inject 120 mg into the skin every 30 (thirty) days. (Patient not taking: Reported on 03/09/2023), Disp: 3 mL, Rfl: 3   hydrOXYzine (ATARAX) 50 MG tablet, Take 1 tablet (50 mg total) by mouth in the morning and at bedtime. BID (Patient not taking: Reported on 03/09/2023), Disp: 180 tablet, Rfl: 0   Omega-3 1000 MG CAPS, Take 2 each by mouth daily. (Patient not taking: Reported on 03/09/2023), Disp: , Rfl:    OVER THE COUNTER MEDICATION, Take 1 Piece by mouth daily. CBD gummy (contains small amount of THC) (Patient not taking: Reported on 03/09/2023), Disp: , Rfl:   Physical exam:  Vitals:   03/23/23 1315  BP: (!) 132/100  Pulse: 67  Resp: 18  Temp: 98.3 F (36.8 C)  TempSrc: Tympanic  SpO2: 99%  Weight: 191 lb 8 oz (86.9 kg)  Height: 5\' 5"  (1.651 m)   Physical Exam Cardiovascular:     Rate and Rhythm: Normal rate and regular rhythm.     Heart sounds: Normal heart sounds.  Pulmonary:     Effort: Pulmonary effort is normal.  Skin:    General: Skin is warm and dry.  Neurological:     Mental Status: He is alert and oriented to person, place, and time.         Latest Ref Rng & Units 10/02/2022    2:31 PM  CMP  Glucose 70 - 99 mg/dL 474   BUN 6 - 24 mg/dL 15   Creatinine 2.59 - 1.27 mg/dL 5.63   Sodium 875 - 643 mmol/L 146   Potassium 3.5 - 5.2 mmol/L 3.9   Chloride 96 - 106 mmol/L 104   CO2 20 - 29 mmol/L 22   Calcium 8.7 - 10.2 mg/dL 9.2       Latest Ref Rng & Units 02/10/2023   10:46 AM  CBC  Hemoglobin 13.0 - 17.7 g/dL 32.9   Hematocrit 51.8 - 51.0 % 46.6     Assessment and plan- Patient is a 52 y.o. male here for routine follow-up of secondary polycythemia due to  testosterone replacement therapy  Most recent CBC from the lab work showed a hematocrit of 49.5. he does not require any phlebotomy at this time.  Repeat CBC in 3 months in 6 months and we will see him back in 6 months.  Recent PSA on testosterone replacement therapy within normal limits   Visit Diagnosis 1. Polycythemia, secondary      Dr. Owens Shark, MD, MPH [facility] at [facility] [phone] 03/23/2023 3:22 PM

## 2023-03-31 DIAGNOSIS — M533 Sacrococcygeal disorders, not elsewhere classified: Secondary | ICD-10-CM | POA: Diagnosis not present

## 2023-03-31 DIAGNOSIS — Z978 Presence of other specified devices: Secondary | ICD-10-CM | POA: Diagnosis not present

## 2023-03-31 DIAGNOSIS — M545 Low back pain, unspecified: Secondary | ICD-10-CM | POA: Diagnosis not present

## 2023-03-31 DIAGNOSIS — M47816 Spondylosis without myelopathy or radiculopathy, lumbar region: Secondary | ICD-10-CM | POA: Diagnosis not present

## 2023-03-31 DIAGNOSIS — M5136 Other intervertebral disc degeneration, lumbar region: Secondary | ICD-10-CM | POA: Diagnosis not present

## 2023-04-05 ENCOUNTER — Telehealth: Payer: Self-pay

## 2023-04-05 ENCOUNTER — Other Ambulatory Visit (HOSPITAL_COMMUNITY): Payer: Self-pay

## 2023-04-05 NOTE — Telephone Encounter (Signed)
Received a renewal for Qulipta via [facility]-PT has not be evaluated since starting [place] and insurance would like to know        Please advise-

## 2023-04-07 ENCOUNTER — Other Ambulatory Visit: Payer: Self-pay

## 2023-04-07 MED ORDER — SUMATRIPTAN SUCCINATE 6 MG/0.5ML ~~LOC~~ SOAJ: 6.0000 mg | Freq: Every day | SUBCUTANEOUS | 2 refills | Status: DC | PRN

## 2023-04-27 NOTE — Progress Notes (Deleted)
No chief complaint on file.   HISTORY OF PRESENT ILLNESS:  04/27/23 ALL:  Cody [name] returns for follow up for migraines. We last saw him 10/2022 and switched back to Emgality injections. Insurance preferred [employer] or [employer]	[place] every other day. We started [place] daily. He called 03/2023 reporting side effects with Qulipta.   10/22/2022 ALL: Cody [name] returns for follow up for migraines. He was last seen 09/2021 and doing well on Emgality every 30 days. We had to switch him to Amovig 03/2022 due to insurance. PCP continues sumatriptan injections and phenergan PRN. He reports headaches are significantly worse. He is having nearly daily headaches. He is taking sumatriptan injections regularly. He has filled both autoinjector and vials. He reports PCP gave him 100mg  tablets that he is taking as well. He reports significant anxiety.   Topiramate added 02/2022 by psychiatry for PTSD. He is followed by every 4-6 weeks. He is not seeing psychology right now but does feel he needs to see a therapist. He continues Trintellix, Wellbutrin, Buspar and lamotrigine. He is also seeing pain management and recently started on Nucynta.   OSA diagnosed in 2021 but he could not tolerate CPAP therapy. He lost about 50lbs since study. He does not wish to repeat HST.   10/02/2021 ALL: Cody [name] returns for follow up for migraines. We switched him from Ajovy to [facility] in 05/2021. Since, he reports headaches have reduced by about 50%. He is very pleased with response. He does report injections sting a little but are tolerable. He is not having to use Imitrex.   He is not using CPAP. Two day HST showed AHI 26/hr with nadir of 84%. He was advised to start CPAP but reports difficulty with DME and he did not wish to continue. He has lost about 50 pounds. He decreased caloric intake. He feels that appetite suppression started due to nausea with headaches. Weight has been stable for the past 3-4 months. He continues to see psychiatry and pain  management. He is getting ketamine treatments PRN and feels that this is helping significantly with depression and pain.   05/29/2021 ALL:  Cody [name] is a 52 y.o. male here today for follow up for migraines. He was started on Ajovy following consult with Dr Lucia Gaskins 11/2020. MRI was normal. He feels that some months are better than others. He has tolerated Ajovy but not sure it working as well as he would like. Last injection Migraines are always at night. He has about 7-8 per month. He usually vomits with migraines. He is followed by Dr Roderic Ovens with [facility]. Dr Para March follows for PCP needs. He is seen by psychiatry, unsure of name as he was recently switched to new provider. He is considering ketamine treatments. Sumatriptan and phenergan prescribed by PCP helps with abortive therapy.    HISTORY (copied from Dr Trevor Mace previous note)  HPI:  Cody [name] is a 52 y.o. male here as requested by Joaquim Nam, MD for headache. PMHx already evaluated by neurology for headache and severe sleep apnea Dr[facility] 2021, MRI and MRV ordered, PTSD, hypothyroidism, diagnosed with nonepileptic spells by Dr. Margaretmary Bayley, diagnosed with severe sleep apnea by Dr. Sherryll Burger May 2021 a CPAP machine was ordered for him, chronic pain on a morphine pump, hyperlipidemia, depression, degenerative disc disease, chronic back pain, bipolar 2, anxiety, cigarette smoker, obesity, chronic pain, headache.  I reviewed Dr. Dwana Curd notes: He seen on August 30, 2020 complaining of headache for about a  week, at the base of the skull, radiates over the right more than left temple, right side of the head, he vomited from pain in the prior 24 hours, use cold compress, later in bed in a dark room, he was able to sleep some and the pain got better, but the headache continued frontal right side, no known fever, stiff neck no speech or motor changes.  In April 2021 he had a normal MRI of the brain.  Per Dr. Dwana Curd evaluation,  concerning for migraine, he was prescribed Imitrex and Phenergan as needed and referred to neurology.   Patient has been seen by neurology in the past, in January 2021 he was seen at the [facility] by Dr. Sherryll Burger, for episodic confusion, more so in the morning with associated disequilibrium, history of bipolar disorder and substance abuse, chronic pain on morphine pump with some episodes of seizure-like activity likely nonepileptic spells.  Dr. Sherryll Burger recommended EEG, home sleep study, neurocognitive testing, head CT October 2020 was unremarkable.  They ordered home sleep test, speech therapy for cognitive training, neuropsychology for neurocognitive testing, they checked B12, B1, folate, RPR, Lyme, ACE, ESR, CRP, ANA, IFE, rheumatoid arthritis factor.   Patient is here alone and reports: The first headache was in January of this year, "out of the blue", 2-3 times a week, 4-7/10 in pain, Imitrex helps and sp does promethazine. He has nausea and vomiting, worst headache of life so severe he was terrified, they are on the right side and on the top, pulsating and pounding and throbbing, excruciating, he may see sparkles in the eyes he also gets pain in the back of the neck and pressure, he takes imitrext at that point. The headaches can last 24-48 hours, with treatment under a day, photophobia/phonophobia, since January 1/2 the month of migraines, he is worried, he can't drive with the migraines, no family history, acute onset, no medication overuse, does not always have an aura. He is trying to examine his lifestyle, stress, sleep and nothing has been discovered, they come after 12, black out helps, right eye water, not stabbing, he has pressure behind his eye.  He has numbness in the right side of the scalp and face with the headaches, but no focal weakness. No other focal neurologic deficits, associated symptoms, inciting events or modifiable factors.   Reviewed notes, labs and imaging from outside physicians,  which showed:   CMP with elevated glucose 06/13/2020 otherwise unremarkable   Celexa, lexapro, lithium, prednisone, propranolol, seroquel, imitrex, lamictal, amitriptyline, topamax with lamictal contraindicated   MRI brain 11/23/2019: FINDINGS: Brain: There is no acute infarction or intracranial hemorrhage. There is no intracranial mass, mass effect, or edema. There is no hydrocephalus or extra-axial fluid collection. Ventricles and sulci are normal in size and configuration. No abnormal enhancement.   Vascular: Major vessel flow voids at the skull base are preserved.   Skull and upper cervical spine: Normal marrow signal is preserved.   Sinuses/Orbits: Paranasal sinuses are aerated. Orbits are unremarkable.   Other: Sella is unremarkable.  Mastoid air cells are clear.   IMPRESSION: Normal MRI of the brain.   REVIEW OF SYSTEMS: Out of a complete 14 system review of symptoms, the patient complains only of the following symptoms, headaches, chronic pain, anxiety, depression and all other reviewed systems are negative.   ALLERGIES: Allergies  Allergen Reactions   Neurontin [Gabapentin]     lethargic   Vancomycin     Swelling    Doxycycline     vomiting  Lithium     tremor   Mirtazapine     REACTION: lethargy ( Remeron)     HOME MEDICATIONS: Outpatient Medications Prior to Visit  Medication Sig Dispense Refill   ALPRAZolam (XANAX) 1 MG tablet PLEASE SEE ATTACHED FOR DETAILED DIRECTIONS     AMBULATORY NON FORMULARY MEDICATION 8.608 mg by Intrathecal Infusion route daily. Morphine Pump Implant     Atogepant (QULIPTA) 60 MG TABS Take 1 tablet (60 mg total) by mouth daily. 90 tablet 3   atorvastatin (LIPITOR) 10 MG tablet TAKE 1 TABLET BY MOUTH DAILY 90 tablet 3   buPROPion (WELLBUTRIN XL) 150 MG 24 hr tablet Take 150 mg by mouth every morning.     busPIRone (BUSPAR) 10 MG tablet Take 1 tablet (10 mg total) by mouth 2 (two) times daily.     CAPLYTA 42 MG capsule Take 42 mg  by mouth daily.     Cholecalciferol (VITAMIN D) 50 MCG (2000 UT) CAPS Take 1 capsule by mouth daily.     Galcanezumab-gnlm (EMGALITY) 120 MG/ML SOAJ Inject 120 mg into the skin every 30 (thirty) days. (Patient not taking: Reported on 03/09/2023) 3 mL 3   hydrOXYzine (ATARAX) 50 MG tablet Take 1 tablet (50 mg total) by mouth in the morning and at bedtime. BID (Patient not taking: Reported on 03/09/2023) 180 tablet 0   lamoTRIgine (LAMICTAL) 100 MG tablet Take 100 mg by mouth 2 (two) times daily.     levothyroxine (SYNTHROID) 75 MCG tablet TAKE 1 TABLET BY MOUTH DAILY  BEFORE BREAKFAST 90 tablet 3   NEEDLE, DISP, 18 G (BD SAFETYGLIDE NEEDLE) 18G X 1-1/2" MISC Draw up 50 each 0   NUCYNTA 100 MG TABS Take 1 tablet by mouth every 6 (six) hours as needed.     Omega-3 1000 MG CAPS Take 2 each by mouth daily. (Patient not taking: Reported on 03/09/2023)     OVER THE COUNTER MEDICATION Take 1 Piece by mouth daily. CBD gummy (contains small amount of THC) (Patient not taking: Reported on 03/09/2023)     polyethylene glycol powder (GLYCOLAX/MIRALAX) 17 GM/SCOOP powder Take 17 g by mouth every other day as needed.     promethazine (PHENERGAN) 25 MG tablet TAKE 1/2 TO 1 TABLET BY MOUTH EVERY 8 HOURS AS NEEDED FOR NAUSEA AND VOMITING 20 tablet 1   SUMAtriptan 6 MG/0.5ML SOAJ Inject 6 mg into the skin daily as needed (with 2nd dose 2 hours after 1st dose if needed.). 5 mL 2   SYRINGE-NEEDLE, DISP, 3 ML (B-D 3CC LUER-LOK SYR 21GX1-1/2) 21G X 1-1/2" 3 ML MISC Use this needle to injection 50 each 0   tadalafil (CIALIS) 5 MG tablet Take 1 tablet (5 mg total) by mouth daily as needed for erectile dysfunction. 90 tablet 0   tamsulosin (FLOMAX) 0.4 MG CAPS capsule TAKE 1 CAPSULE BY MOUTH DAILY  AFTER BREAKFAST 90 capsule 3   testosterone cypionate (DEPOTESTOSTERONE CYPIONATE) 200 MG/ML injection Inject 0.5 mLs (100 mg total) into the muscle every 14 (fourteen) days. 10 mL 0   tiZANidine (ZANAFLEX) 4 MG tablet Take 4 mg by  mouth as needed.     topiramate (TOPAMAX) 50 MG tablet Take 50 mg by mouth daily.     vortioxetine HBr (TRINTELLIX) 10 MG TABS tablet Take 10 mg by mouth daily.     No facility-administered medications prior to visit.     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; herniated disc L5- S1   Degenerative disk disease    Depression    Fracture, mandible (HCC)    Fracture, tibia    Headache    Hyperlipidemia    Hypertension    resolved   Hypothyroidism 05/2007   Dr Marga Melnick (retired)/ Dr Crawford Givens   Neuromuscular disorder [facility])    PTSD (post-traumatic stress disorder)    Suicidal behavior    a.) ideations with attempt x 1     PAST SURGICAL HISTORY: Past Surgical History:  Procedure Laterality Date   APPENDECTOMY     peritonitis   BACK SURGERY     EVALUATION UNDER ANESTHESIA WITH HEMORRHOIDECTOMY N/A 01/28/2022   Procedure: EXAM UNDER ANESTHESIA WITH HEMORRHOIDECTOMY;  Surgeon: Campbell Lerner, MD;  Location: [facility] ORS;  Service: General;  Laterality: N/A;   FRACTURE SURGERY     INTRATHECAL PUMP IMPLANTATION  2007   LAMINECTOMY  2004   arterial injury, transfused 7 units pc, implant surgery    MANDIBLE FRACTURE SURGERY     mugged   THORACIC DISCECTOMY     T10   UPPER GASTROINTESTINAL ENDOSCOPY  2006   gastritis     FAMILY HISTORY: Family History  Problem Relation Age of Onset   Ulcers Mother    Depression Mother    Thyroid disease Mother        hypo   Stroke Father 73   Thyroid disease Father        hypo   Esophageal cancer Maternal Aunt    Lung cancer Maternal Grandmother    Thyroid disease Maternal Grandmother        hypo   Colon cancer  Maternal Grandfather    Heart attack Maternal Grandfather 55   Esophageal cancer Maternal Grandfather    Diabetes Neg Hx    Prostate cancer Neg Hx    Kidney cancer Neg Hx    Bladder Cancer Neg Hx    Migraines Neg Hx    Headache Neg Hx    Colon polyps Neg Hx    Rectal cancer Neg Hx    Stomach cancer Neg Hx      SOCIAL HISTORY: Social History   Socioeconomic History   Marital status: Married    Spouse name: Research scientist (physical sciences)   Number of children: 0   Years of education: Not on file   Highest education level: Not on file  Occupational History   Occupation: Disabled  Tobacco Use   Smoking status: Former    Current packs/day: 0.00    Types: E-cigarettes, Cigarettes    Quit date: 08/10/2009    Years since quitting: 13.7    Passive exposure: Never   Smokeless tobacco: Never   Tobacco comments:    smoked age intermittently age [age]; up to 2 cigarettes/ day   Vaping Use   Vaping status: Every Day   Substances: Nicotine, Flavoring  Substance and Sexual Activity   Alcohol use: No   Drug use: No   Sexual activity: Not on file  Other Topics Concern   Not on file  Social History Narrative   From [place].     [address] Grad   To [place] [address]  Disability from bipolar affective disorder, prev accounting.     Married 1997   No kids   Enjoys walking, travel.     Social Determinants of Health   Financial Resource Strain: Low Risk  (03/09/2023)   Overall Financial Resource Strain (CARDIA)    Difficulty of Paying Living Expenses: Not hard at all  Food Insecurity: No Food Insecurity (03/09/2023)   Hunger Vital Sign    Worried About Running Out of Food in the Last Year: Never true    Ran Out of Food in the Last Year: Never true  Transportation Needs: No Transportation Needs (03/09/2023)   PRAPARE - Administrator, Civil Service (Medical): No    Lack of Transportation (Non-Medical): No  Physical Activity: Insufficiently Active (03/09/2023)   Exercise Vital Sign    Days  of Exercise per Week: 2 days    Minutes of Exercise per Session: 40 min  Stress: No Stress Concern Present (03/09/2023)   [employer] of Occupational Health - Occupational Stress Questionnaire    Feeling of Stress : Not at all  Social Connections: Moderately Integrated (03/09/2023)   Social Connection and Isolation Panel [NHANES]    Frequency of Communication with Friends and Family: Never    Frequency of Social Gatherings with Friends and Family: Never    Attends Religious Services: More than 4 times per year    Active Member of [employer] or Organizations: Yes    Attends Engineer, structural: More than 4 times per year    Marital Status: Married  Catering manager Violence: Not At Risk (03/09/2023)   Humiliation, Afraid, Rape, and Kick questionnaire    Fear of Current or Ex-Partner: No    Emotionally Abused: No    Physically Abused: No    Sexually Abused: No     PHYSICAL EXAM  There were no vitals filed for this visit.    There is no height or weight on file to calculate BMI.  Generalized: Well developed, in no acute distress  Cardiology: normal rate and rhythm, no murmur auscultated  Respiratory: clear to auscultation bilaterally    Neurological examination  Mentation: Alert oriented to time, place, history taking. Follows all commands speech and language fluent Cranial nerve II-XII: Pupils were equal round reactive to light. Extraocular movements were full, visual field were full on confrontational test. Facial sensation and strength were normal. Head turning and shoulder shrug  were normal and symmetric. Motor: The motor testing reveals 5 over 5 strength of all 4 extremities. Good symmetric motor tone is noted throughout.  Gait and station: Gait is normal.    DIAGNOSTIC DATA (LABS, IMAGING, TESTING) - I reviewed patient records, labs, notes, testing and imaging myself where available.  Lab Results  Component Value Date   WBC 8.2 03/03/2022   HGB 14.8  02/10/2023   HCT 46.6 02/10/2023   MCV 89 03/03/2022   PLT 188 03/03/2022      Component Value Date/Time   NA 146 (H) 10/02/2022 1431   NA 142 03/23/2014 1752   K 3.9 10/02/2022 1431   K 3.9 03/23/2014 1752   CL 104 10/02/2022 1431   CL 103 03/23/2014 1752   CO2 22 10/02/2022 1431   CO2 28 03/23/2014 1752   GLUCOSE 128 (H) 10/02/2022 1431   GLUCOSE 130 (H) 02/11/2022 0412   GLUCOSE 166 (H) 03/23/2014 1752   BUN 15 10/02/2022 1431   BUN 14 03/23/2014 1752   CREATININE 1.38 (H) 10/02/2022 1431   CREATININE 1.14  12/09/2016 0000   CREATININE 1.33 (H) 03/23/2014 1752   CALCIUM 9.2 10/02/2022 1431   CALCIUM 9.2 03/23/2014 1752   PROT 7.2 03/03/2022 0823   PROT 8.3 (H) 03/23/2014 1752   ALBUMIN 4.8 03/03/2022 0823   ALBUMIN 4.2 03/23/2014 1752   AST 22 03/03/2022 0823   AST 22 12/09/2016 0000   ALT 21 03/03/2022 0823   ALT 22 12/09/2016 0000   ALT 84 (H) 03/23/2014 1752   ALKPHOS 94 03/03/2022 0823   ALKPHOS 76 03/23/2014 1752   BILITOT 0.3 03/03/2022 0823   BILITOT 0.5 03/23/2014 1752   GFRNONAA >60 02/11/2022 0412   GFRNONAA >60 03/23/2014 1752   GFRAA 117 06/13/2020 1347   GFRAA >60 03/23/2014 1752   Lab Results  Component Value Date   CHOL 153 03/03/2022   HDL 26 (L) 03/03/2022   LDLCALC 98 03/03/2022   LDLDIRECT 176.4 10/11/2012   TRIG 165 (H) 03/03/2022   CHOLHDL 5.9 (H) 03/03/2022   Lab Results  Component Value Date   HGBA1C 5.6 07/23/2021   No results found for: "VITAMINB12" Lab Results  Component Value Date   TSH 2.070 10/02/2022       02/28/2018   12:15 PM  MMSE - Mini Mental State Exam  Orientation to time 5  Orientation to Place 5  Registration 3  Attention/ Calculation 0  Recall 3  Language- name 2 objects 0  Language- repeat 1  Language- follow 3 step command 3  Language- read & follow direction 0  Write a sentence 0  Copy design 0  Total score 20         No data to display           ASSESSMENT AND PLAN  52 y.o. year old  male  has a past medical history of Anxiety, Bipolar 2 disorder (HCC) (03/2009), Chronic back pain, Chronic pain syndrome, Complication of anesthesia, DDD (degenerative disc disease), Degenerative disk disease, Depression, Fracture, mandible (HCC), Fracture, tibia, Headache, Hyperlipidemia, Hypertension, Hypothyroidism (05/2007), Neuromuscular disorder (HCC), PTSD (post-traumatic stress disorder), and Suicidal behavior. here with    No diagnosis found.  Tim has tolerated Emgality and reports at least 50% improvement in migraine intensity and frequency. Amovig has not been helpful at all. I will have him switch back to [facility] pending coverage. May consider Qulipta and or Botox in future. He will continue sumatriptan and phenergan as prescribed by PCP. I have advised proper dosing and encouraged him not to exceed more than 2 doses in 24 hours or 10 doses per month. He was advised to keep aclose eye on his BP. He is extremely anxious in the office, today. He will continue close follow up with psychiatry and I have encouraged him to consider a counselor. He was unable to tolerate CPAP. He has lost 50 pounds. I have encouraged him to consider repeat testing. He will let me know if he decides to pursue. He will continue close follow up with PCP, pain management and psychiatry. Follow up with me in 1 year.    No orders of the defined types were placed in this encounter.     No orders of the defined types were placed in this encounter.      Shawnie Dapper, MSN, FNP-C 04/27/2023, 12:44 PM  [facility] [address], [address] [place], [place] [address] [phone]

## 2023-05-03 ENCOUNTER — Telehealth: Payer: Self-pay | Admitting: Family Medicine

## 2023-05-03 ENCOUNTER — Ambulatory Visit: Payer: Medicare Other | Admitting: Family Medicine

## 2023-05-03 DIAGNOSIS — G43709 Chronic migraine without aura, not intractable, without status migrainosus: Secondary | ICD-10-CM

## 2023-05-03 NOTE — Telephone Encounter (Signed)
LVM and sent mychart msg informing pt of r/s needed for 9/23 appt- NP out.

## 2023-05-04 NOTE — Telephone Encounter (Signed)
Rescheduled pt appt 05/12/23 at 8:30 am.

## 2023-05-10 NOTE — Patient Instructions (Incomplete)

## 2023-05-10 NOTE — Progress Notes (Unsigned)
No chief complaint on file.   HISTORY OF PRESENT ILLNESS:  05/10/23 ALL:  Cody [name] returns for follow up for migraines. We last saw him 10/2022 and switched back to Emgality injections. Insurance preferred [employer] or [employer]	[place] every other day. We started [place] daily. He called 03/2023 reporting side effects with Qulipta.   10/22/2022 ALL: Cody [name] returns for follow up for migraines. He was last seen 09/2021 and doing well on Emgality every 30 days. We had to switch him to Amovig 03/2022 due to insurance. PCP continues sumatriptan injections and phenergan PRN. He reports headaches are significantly worse. He is having nearly daily headaches. He is taking sumatriptan injections regularly. He has filled both autoinjector and vials. He reports PCP gave him 100mg  tablets that he is taking as well. He reports significant anxiety.   Topiramate added 02/2022 by psychiatry for PTSD. He is followed by every 4-6 weeks. He is not seeing psychology right now but does feel he needs to see a therapist. He continues Trintellix, Wellbutrin, Buspar and lamotrigine. He is also seeing pain management and recently started on Nucynta.   OSA diagnosed in 2021 but he could not tolerate CPAP therapy. He lost about 50lbs since study. He does not wish to repeat HST.   10/02/2021 ALL: Cody [name] returns for follow up for migraines. We switched him from Ajovy to [facility] in 05/2021. Since, he reports headaches have reduced by about 50%. He is very pleased with response. He does report injections sting a little but are tolerable. He is not having to use Imitrex.   He is not using CPAP. Two day HST showed AHI 26/hr with nadir of 84%. He was advised to start CPAP but reports difficulty with DME and he did not wish to continue. He has lost about 50 pounds. He decreased caloric intake. He feels that appetite suppression started due to nausea with headaches. Weight has been stable for the past 3-4 months. He continues to see psychiatry and pain  management. He is getting ketamine treatments PRN and feels that this is helping significantly with depression and pain.   05/29/2021 ALL:  LEV CERVONE is a 52 y.o. male here today for follow up for migraines. He was started on Ajovy following consult with Dr Lucia Gaskins 11/2020. MRI was normal. He feels that some months are better than others. He has tolerated Ajovy but not sure it working as well as he would like. Last injection Migraines are always at night. He has about 7-8 per month. He usually vomits with migraines. He is followed by Dr Roderic Ovens with [facility]. Dr Para March follows for PCP needs. He is seen by psychiatry, unsure of name as he was recently switched to new provider. He is considering ketamine treatments. Sumatriptan and phenergan prescribed by PCP helps with abortive therapy.    HISTORY (copied from Dr Trevor Mace previous note)  HPI:  [name] is a 52 y.o. male here as requested by Joaquim Nam, MD for headache. PMHx already evaluated by neurology for headache and severe sleep apnea Dr. [facility] 2021, MRI and MRV ordered, PTSD, hypothyroidism, diagnosed with nonepileptic spells by Dr. Margaretmary Bayley, diagnosed with severe sleep apnea by Dr. Sherryll Burger May 2021 a CPAP machine was ordered for him, chronic pain on a morphine pump, hyperlipidemia, depression, degenerative disc disease, chronic back pain, bipolar 2, anxiety, cigarette smoker, obesity, chronic pain, headache.  I reviewed Dr. Dwana Curd notes: He seen on August 30, 2020 complaining of headache for about a  week, at the base of the skull, radiates over the right more than left temple, right side of the head, he vomited from pain in the prior 24 hours, use cold compress, later in bed in a dark room, he was able to sleep some and the pain got better, but the headache continued frontal right side, no known fever, stiff neck no speech or motor changes.  In April 2021 he had a normal MRI of the brain.  Per Dr. Dwana Curd evaluation,  concerning for migraine, he was prescribed Imitrex and Phenergan as needed and referred to neurology.   Patient has been seen by neurology in the past, in January 2021 he was seen at the [facility] by Dr. Sherryll Burger, for episodic confusion, more so in the morning with associated disequilibrium, history of bipolar disorder and substance abuse, chronic pain on morphine pump with some episodes of seizure-like activity likely nonepileptic spells.  Dr. Sherryll Burger recommended EEG, home sleep study, neurocognitive testing, head CT October 2020 was unremarkable.  They ordered home sleep test, speech therapy for cognitive training, neuropsychology for neurocognitive testing, they checked B12, B1, folate, RPR, Lyme, ACE, ESR, CRP, ANA, IFE, rheumatoid arthritis factor.   Patient is here alone and reports: The first headache was in January of this year, "out of the blue", 2-3 times a week, 4-7/10 in pain, Imitrex helps and sp does promethazine. He has nausea and vomiting, worst headache of life so severe he was terrified, they are on the right side and on the top, pulsating and pounding and throbbing, excruciating, he may see sparkles in the eyes he also gets pain in the back of the neck and pressure, he takes imitrext at that point. The headaches can last 24-48 hours, with treatment under a day, photophobia/phonophobia, since January 1/2 the month of migraines, he is worried, he can't drive with the migraines, no family history, acute onset, no medication overuse, does not always have an aura. He is trying to examine his lifestyle, stress, sleep and nothing has been discovered, they come after 12, black out helps, right eye water, not stabbing, he has pressure behind his eye.  He has numbness in the right side of the scalp and face with the headaches, but no focal weakness. No other focal neurologic deficits, associated symptoms, inciting events or modifiable factors.   Reviewed notes, labs and imaging from outside physicians,  which showed:   CMP with elevated glucose 06/13/2020 otherwise unremarkable   Celexa, lexapro, lithium, prednisone, propranolol, seroquel, imitrex, lamictal, amitriptyline, topamax with lamictal contraindicated   MRI brain 11/23/2019: FINDINGS: Brain: There is no acute infarction or intracranial hemorrhage. There is no intracranial mass, mass effect, or edema. There is no hydrocephalus or extra-axial fluid collection. Ventricles and sulci are normal in size and configuration. No abnormal enhancement.   Vascular: Major vessel flow voids at the skull base are preserved.   Skull and upper cervical spine: Normal marrow signal is preserved.   Sinuses/Orbits: Paranasal sinuses are aerated. Orbits are unremarkable.   Other: Sella is unremarkable.  Mastoid air cells are clear.   IMPRESSION: Normal MRI of the brain.   REVIEW OF SYSTEMS: Out of a complete 14 system review of symptoms, the patient complains only of the following symptoms, headaches, chronic pain, anxiety, depression and all other reviewed systems are negative.   ALLERGIES: Allergies  Allergen Reactions   Neurontin [Gabapentin]     lethargic   Vancomycin     Swelling    Doxycycline     vomiting  Lithium     tremor   Mirtazapine     REACTION: lethargy ( Remeron)     HOME MEDICATIONS: Outpatient Medications Prior to Visit  Medication Sig Dispense Refill   ALPRAZolam (XANAX) 1 MG tablet PLEASE SEE ATTACHED FOR DETAILED DIRECTIONS     AMBULATORY NON FORMULARY MEDICATION 8.608 mg by Intrathecal Infusion route daily. Morphine Pump Implant     Atogepant (QULIPTA) 60 MG TABS Take 1 tablet (60 mg total) by mouth daily. 90 tablet 3   atorvastatin (LIPITOR) 10 MG tablet TAKE 1 TABLET BY MOUTH DAILY 90 tablet 3   buPROPion (WELLBUTRIN XL) 150 MG 24 hr tablet Take 150 mg by mouth every morning.     busPIRone (BUSPAR) 10 MG tablet Take 1 tablet (10 mg total) by mouth 2 (two) times daily.     CAPLYTA 42 MG capsule Take 42 mg  by mouth daily.     Cholecalciferol (VITAMIN D) 50 MCG (2000 UT) CAPS Take 1 capsule by mouth daily.     Galcanezumab-gnlm (EMGALITY) 120 MG/ML SOAJ Inject 120 mg into the skin every 30 (thirty) days. (Patient not taking: Reported on 03/09/2023) 3 mL 3   hydrOXYzine (ATARAX) 50 MG tablet Take 1 tablet (50 mg total) by mouth in the morning and at bedtime. BID (Patient not taking: Reported on 03/09/2023) 180 tablet 0   lamoTRIgine (LAMICTAL) 100 MG tablet Take 100 mg by mouth 2 (two) times daily.     levothyroxine (SYNTHROID) 75 MCG tablet TAKE 1 TABLET BY MOUTH DAILY  BEFORE BREAKFAST 90 tablet 3   NEEDLE, DISP, 18 G (BD SAFETYGLIDE NEEDLE) 18G X 1-1/2" MISC Draw up 50 each 0   NUCYNTA 100 MG TABS Take 1 tablet by mouth every 6 (six) hours as needed.     Omega-3 1000 MG CAPS Take 2 each by mouth daily. (Patient not taking: Reported on 03/09/2023)     OVER THE COUNTER MEDICATION Take 1 Piece by mouth daily. CBD gummy (contains small amount of THC) (Patient not taking: Reported on 03/09/2023)     polyethylene glycol powder (GLYCOLAX/MIRALAX) 17 GM/SCOOP powder Take 17 g by mouth every other day as needed.     promethazine (PHENERGAN) 25 MG tablet TAKE 1/2 TO 1 TABLET BY MOUTH EVERY 8 HOURS AS NEEDED FOR NAUSEA AND VOMITING 20 tablet 1   SUMAtriptan 6 MG/0.5ML SOAJ Inject 6 mg into the skin daily as needed (with 2nd dose 2 hours after 1st dose if needed.). 5 mL 2   SYRINGE-NEEDLE, DISP, 3 ML (B-D 3CC LUER-LOK SYR 21GX1-1/2) 21G X 1-1/2" 3 ML MISC Use this needle to injection 50 each 0   tadalafil (CIALIS) 5 MG tablet Take 1 tablet (5 mg total) by mouth daily as needed for erectile dysfunction. 90 tablet 0   tamsulosin (FLOMAX) 0.4 MG CAPS capsule TAKE 1 CAPSULE BY MOUTH DAILY  AFTER BREAKFAST 90 capsule 3   testosterone cypionate (DEPOTESTOSTERONE CYPIONATE) 200 MG/ML injection Inject 0.5 mLs (100 mg total) into the muscle every 14 (fourteen) days. 10 mL 0   tiZANidine (ZANAFLEX) 4 MG tablet Take 4 mg by  mouth as needed.     topiramate (TOPAMAX) 50 MG tablet Take 50 mg by mouth daily.     vortioxetine HBr (TRINTELLIX) 10 MG TABS tablet Take 10 mg by mouth daily.     No facility-administered medications prior to visit.     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; herniated disc L5- S1   Degenerative disk disease    Depression    Fracture, mandible (HCC)    Fracture, tibia    Headache    Hyperlipidemia    Hypertension    resolved   Hypothyroidism 05/2007   Dr Marga Melnick (retired)/ Dr Crawford Givens   Neuromuscular disorder [facility])    PTSD (post-traumatic stress disorder)    Suicidal behavior    a.) ideations with attempt x 1     PAST SURGICAL HISTORY: Past Surgical History:  Procedure Laterality Date   APPENDECTOMY     peritonitis   BACK SURGERY     EVALUATION UNDER ANESTHESIA WITH HEMORRHOIDECTOMY N/A 01/28/2022   Procedure: EXAM UNDER ANESTHESIA WITH HEMORRHOIDECTOMY;  Surgeon: Campbell Lerner, MD;  Location: [facility] ORS;  Service: General;  Laterality: N/A;   FRACTURE SURGERY     INTRATHECAL PUMP IMPLANTATION  2007   LAMINECTOMY  2004   arterial injury, transfused 7 units pc, implant surgery    MANDIBLE FRACTURE SURGERY     mugged   THORACIC DISCECTOMY     T10   UPPER GASTROINTESTINAL ENDOSCOPY  2006   gastritis     FAMILY HISTORY: Family History  Problem Relation Age of Onset   Ulcers Mother    Depression Mother    Thyroid disease Mother        hypo   Stroke Father 19   Thyroid disease Father        hypo   Esophageal cancer Maternal Aunt    Lung cancer Maternal Grandmother    Thyroid disease Maternal Grandmother        hypo   Colon cancer  Maternal Grandfather    Heart attack Maternal Grandfather 55   Esophageal cancer Maternal Grandfather    Diabetes Neg Hx    Prostate cancer Neg Hx    Kidney cancer Neg Hx    Bladder Cancer Neg Hx    Migraines Neg Hx    Headache Neg Hx    Colon polyps Neg Hx    Rectal cancer Neg Hx    Stomach cancer Neg Hx      SOCIAL HISTORY: Social History   Socioeconomic History   Marital status: Married    Spouse name: Research scientist (physical sciences)   Number of children: 0   Years of education: Not on file   Highest education level: Not on file  Occupational History   Occupation: Disabled  Tobacco Use   Smoking status: Former    Current packs/day: 0.00    Types: E-cigarettes, Cigarettes    Quit date: 08/10/2009    Years since quitting: 13.7    Passive exposure: Never   Smokeless tobacco: Never   Tobacco comments:    smoked age intermittently age [age]; up to 2 cigarettes/ day   Vaping Use   Vaping status: Every Day   Substances: Nicotine, Flavoring  Substance and Sexual Activity   Alcohol use: No   Drug use: No   Sexual activity: Not on file  Other Topics Concern   Not on file  Social History Narrative   From [place].     [address] Grad   To [place] [address]  Disability from bipolar affective disorder, prev accounting.     Married 1997   No kids   Enjoys walking, travel.     Social Determinants of Health   Financial Resource Strain: Low Risk  (03/09/2023)   Overall Financial Resource Strain (CARDIA)    Difficulty of Paying Living Expenses: Not hard at all  Food Insecurity: No Food Insecurity (03/09/2023)   Hunger Vital Sign    Worried About Running Out of Food in the Last Year: Never true    Ran Out of Food in the Last Year: Never true  Transportation Needs: No Transportation Needs (03/09/2023)   PRAPARE - Administrator, Civil Service (Medical): No    Lack of Transportation (Non-Medical): No  Physical Activity: Insufficiently Active (03/09/2023)   Exercise Vital Sign    Days  of Exercise per Week: 2 days    Minutes of Exercise per Session: 40 min  Stress: No Stress Concern Present (03/09/2023)   [employer] of Occupational Health - Occupational Stress Questionnaire    Feeling of Stress : Not at all  Social Connections: Moderately Integrated (03/09/2023)   Social Connection and Isolation Panel [NHANES]    Frequency of Communication with Friends and Family: Never    Frequency of Social Gatherings with Friends and Family: Never    Attends Religious Services: More than 4 times per year    Active Member of [employer] or Organizations: Yes    Attends Engineer, structural: More than 4 times per year    Marital Status: Married  Catering manager Violence: Not At Risk (03/09/2023)   Humiliation, Afraid, Rape, and Kick questionnaire    Fear of Current or Ex-Partner: No    Emotionally Abused: No    Physically Abused: No    Sexually Abused: No     PHYSICAL EXAM  There were no vitals filed for this visit.    There is no height or weight on file to calculate BMI.  Generalized: Well developed, in no acute distress  Cardiology: normal rate and rhythm, no murmur auscultated  Respiratory: clear to auscultation bilaterally    Neurological examination  Mentation: Alert oriented to time, place, history taking. Follows all commands speech and language fluent Cranial nerve II-XII: Pupils were equal round reactive to light. Extraocular movements were full, visual field were full on confrontational test. Facial sensation and strength were normal. Head turning and shoulder shrug  were normal and symmetric. Motor: The motor testing reveals 5 over 5 strength of all 4 extremities. Good symmetric motor tone is noted throughout.  Gait and station: Gait is normal.    DIAGNOSTIC DATA (LABS, IMAGING, TESTING) - I reviewed patient records, labs, notes, testing and imaging myself where available.  Lab Results  Component Value Date   WBC 8.2 03/03/2022   HGB 14.8  02/10/2023   HCT 46.6 02/10/2023   MCV 89 03/03/2022   PLT 188 03/03/2022      Component Value Date/Time   NA 146 (H) 10/02/2022 1431   NA 142 03/23/2014 1752   K 3.9 10/02/2022 1431   K 3.9 03/23/2014 1752   CL 104 10/02/2022 1431   CL 103 03/23/2014 1752   CO2 22 10/02/2022 1431   CO2 28 03/23/2014 1752   GLUCOSE 128 (H) 10/02/2022 1431   GLUCOSE 130 (H) 02/11/2022 0412   GLUCOSE 166 (H) 03/23/2014 1752   BUN 15 10/02/2022 1431   BUN 14 03/23/2014 1752   CREATININE 1.38 (H) 10/02/2022 1431   CREATININE 1.14  12/09/2016 0000   CREATININE 1.33 (H) 03/23/2014 1752   CALCIUM 9.2 10/02/2022 1431   CALCIUM 9.2 03/23/2014 1752   PROT 7.2 03/03/2022 0823   PROT 8.3 (H) 03/23/2014 1752   ALBUMIN 4.8 03/03/2022 0823   ALBUMIN 4.2 03/23/2014 1752   AST 22 03/03/2022 0823   AST 22 12/09/2016 0000   ALT 21 03/03/2022 0823   ALT 22 12/09/2016 0000   ALT 84 (H) 03/23/2014 1752   ALKPHOS 94 03/03/2022 0823   ALKPHOS 76 03/23/2014 1752   BILITOT 0.3 03/03/2022 0823   BILITOT 0.5 03/23/2014 1752   GFRNONAA >60 02/11/2022 0412   GFRNONAA >60 03/23/2014 1752   GFRAA 117 06/13/2020 1347   GFRAA >60 03/23/2014 1752   Lab Results  Component Value Date   CHOL 153 03/03/2022   HDL 26 (L) 03/03/2022   LDLCALC 98 03/03/2022   LDLDIRECT 176.4 10/11/2012   TRIG 165 (H) 03/03/2022   CHOLHDL 5.9 (H) 03/03/2022   Lab Results  Component Value Date   HGBA1C 5.6 07/23/2021   No results found for: "VITAMINB12" Lab Results  Component Value Date   TSH 2.070 10/02/2022       02/28/2018   12:15 PM  MMSE - Mini Mental State Exam  Orientation to time 5  Orientation to Place 5  Registration 3  Attention/ Calculation 0  Recall 3  Language- name 2 objects 0  Language- repeat 1  Language- follow 3 step command 3  Language- read & follow direction 0  Write a sentence 0  Copy design 0  Total score 20         No data to display           ASSESSMENT AND PLAN  52 y.o. year old  male  has a past medical history of Anxiety, Bipolar 2 disorder (HCC) (03/2009), Chronic back pain, Chronic pain syndrome, Complication of anesthesia, DDD (degenerative disc disease), Degenerative disk disease, Depression, Fracture, mandible (HCC), Fracture, tibia, Headache, Hyperlipidemia, Hypertension, Hypothyroidism (05/2007), Neuromuscular disorder (HCC), PTSD (post-traumatic stress disorder), and Suicidal behavior. here with    No diagnosis found.  Tim has tolerated Emgality and reports at least 50% improvement in migraine intensity and frequency. Amovig has not been helpful at all. I will have him switch back to [facility] pending coverage. May consider Qulipta and or Botox in future. He will continue sumatriptan and phenergan as prescribed by PCP. I have advised proper dosing and encouraged him not to exceed more than 2 doses in 24 hours or 10 doses per month. He was advised to keep aclose eye on his BP. He is extremely anxious in the office, today. He will continue close follow up with psychiatry and I have encouraged him to consider a counselor. He was unable to tolerate CPAP. He has lost 50 pounds. I have encouraged him to consider repeat testing. He will let me know if he decides to pursue. He will continue close follow up with PCP, pain management and psychiatry. Follow up with me in 1 year.    No orders of the defined types were placed in this encounter.     No orders of the defined types were placed in this encounter.      Shawnie Dapper, MSN, FNP-C 05/10/2023, 3:03 PM  [facility] [address], [address] [place], [place] [address] [phone]

## 2023-05-11 ENCOUNTER — Other Ambulatory Visit: Payer: Self-pay | Admitting: Family Medicine

## 2023-05-11 ENCOUNTER — Telehealth: Payer: Self-pay | Admitting: Family Medicine

## 2023-05-11 ENCOUNTER — Other Ambulatory Visit: Payer: Self-pay | Admitting: Urology

## 2023-05-11 DIAGNOSIS — E230 Hypopituitarism: Secondary | ICD-10-CM

## 2023-05-11 DIAGNOSIS — E038 Other specified hypothyroidism: Secondary | ICD-10-CM

## 2023-05-11 NOTE — Telephone Encounter (Signed)
[facility] called requesting approval for manufacture change for levothyroxine (SYNTHROID) 75 MCG tablet? Company states their company has changed manufactures & needs approval from Dr. Para March. Call back # [phone], reference # is [id number]

## 2023-05-11 NOTE — Telephone Encounter (Signed)
Please give the approval, notify pt, and schedule recheck TSH in about 2 months.  I put in the order.  Thanks.

## 2023-05-11 NOTE — Telephone Encounter (Signed)
Patient aware of change and lab appt made for 07/12/23 at 10:00 am. Called [facility] and gave okay to change manufacture.

## 2023-05-12 ENCOUNTER — Ambulatory Visit (INDEPENDENT_AMBULATORY_CARE_PROVIDER_SITE_OTHER): Payer: BC Managed Care – PPO | Admitting: Family Medicine

## 2023-05-12 ENCOUNTER — Other Ambulatory Visit: Payer: Self-pay

## 2023-05-12 ENCOUNTER — Other Ambulatory Visit: Payer: Self-pay | Admitting: Family Medicine

## 2023-05-12 ENCOUNTER — Encounter: Payer: Self-pay | Admitting: Family Medicine

## 2023-05-12 VITALS — BP 129/92 | HR 74 | Ht 65.0 in | Wt 202.0 lb

## 2023-05-12 DIAGNOSIS — G43709 Chronic migraine without aura, not intractable, without status migrainosus: Secondary | ICD-10-CM | POA: Diagnosis not present

## 2023-05-12 DIAGNOSIS — E230 Hypopituitarism: Secondary | ICD-10-CM

## 2023-05-12 DIAGNOSIS — Z8669 Personal history of other diseases of the nervous system and sense organs: Secondary | ICD-10-CM

## 2023-05-12 MED ORDER — ERENUMAB-AOOE 140 MG/ML ~~LOC~~ SOAJ: 140.0000 mg | SUBCUTANEOUS | 3 refills | Status: DC

## 2023-05-12 NOTE — Telephone Encounter (Signed)
Please obtain prior authorization for the patient's Aimovig.

## 2023-05-13 ENCOUNTER — Other Ambulatory Visit (HOSPITAL_COMMUNITY): Payer: Self-pay

## 2023-05-13 ENCOUNTER — Telehealth: Payer: Self-pay | Admitting: *Deleted

## 2023-05-13 ENCOUNTER — Encounter: Payer: Self-pay | Admitting: Family Medicine

## 2023-05-13 DIAGNOSIS — E230 Hypopituitarism: Secondary | ICD-10-CM | POA: Diagnosis not present

## 2023-05-13 NOTE — Telephone Encounter (Signed)
Does patient have insurance? The BCBS card scanned in is for medical. I do not find prescription coverage on chart and our system does not find anything when searched.

## 2023-05-13 NOTE — Telephone Encounter (Signed)
Pt sent my chart message stating PA is needed for Aimovig.

## 2023-05-14 ENCOUNTER — Other Ambulatory Visit (HOSPITAL_COMMUNITY): Payer: Self-pay

## 2023-05-14 ENCOUNTER — Other Ambulatory Visit: Payer: Self-pay | Admitting: Urology

## 2023-05-14 ENCOUNTER — Encounter: Payer: Self-pay | Admitting: Oncology

## 2023-05-14 DIAGNOSIS — E230 Hypopituitarism: Secondary | ICD-10-CM

## 2023-05-14 LAB — HEMOGLOBIN AND HEMATOCRIT, BLOOD
Hematocrit: 49.2 % (ref 37.5–51.0)
Hemoglobin: 14.9 g/dL (ref 13.0–17.7)

## 2023-05-14 LAB — TESTOSTERONE: Testosterone: 131 ng/dL — ABNORMAL LOW (ref 264–916)

## 2023-05-14 MED ORDER — TESTOSTERONE CYPIONATE 200 MG/ML IM SOLN
100.0000 mg | INTRAMUSCULAR | 0 refills | Status: DC
Start: 2023-05-14 — End: 2023-09-24

## 2023-05-14 NOTE — Telephone Encounter (Signed)
Can we please have PT upload a copy of his Pharmacy Benefits card-A lot of these new plans have separate PBM's and are giving separate insurance cards with their pharmacy benefit info, especially [facility]. I see we have the medical insurance card loaded dup in epic but I can not use that info if he can provide the RX Bin:, RX PCN, RX Group and RX Member ID-also our eligibility tracker is not pulling up any pharmacy benefit information. Thanks.

## 2023-05-17 DIAGNOSIS — M533 Sacrococcygeal disorders, not elsewhere classified: Secondary | ICD-10-CM | POA: Diagnosis not present

## 2023-05-17 DIAGNOSIS — Z978 Presence of other specified devices: Secondary | ICD-10-CM | POA: Diagnosis not present

## 2023-05-17 NOTE — Telephone Encounter (Signed)
Sent pt my chart message asking for this information and to let us know once card has been uploaded.

## 2023-05-18 ENCOUNTER — Other Ambulatory Visit (HOSPITAL_COMMUNITY): Payer: Self-pay

## 2023-05-18 ENCOUNTER — Encounter: Payer: Self-pay | Admitting: Oncology

## 2023-05-18 ENCOUNTER — Telehealth: Payer: Self-pay

## 2023-05-18 NOTE — Telephone Encounter (Signed)
PA request has been Submitted. New Encounter created for follow up. For additional info see Pharmacy Prior Auth telephone encounter from 05/18/2023.

## 2023-05-18 NOTE — Telephone Encounter (Signed)
Pharmacy Patient Advocate Encounter   Received notification from Physician's Office that prior authorization for Aimovig 140MG /ML auto-injectors is required/requested.   Insurance verification completed.   The patient is insured through [facility] .   Per test claim: PA required; PA submitted to [facility] via CoverMyMeds Key/confirmation #/EOC [id number] Status is pending

## 2023-05-20 DIAGNOSIS — F4312 Post-traumatic stress disorder, chronic: Secondary | ICD-10-CM | POA: Diagnosis not present

## 2023-05-20 DIAGNOSIS — F410 Panic disorder [episodic paroxysmal anxiety] without agoraphobia: Secondary | ICD-10-CM | POA: Diagnosis not present

## 2023-05-20 DIAGNOSIS — F603 Borderline personality disorder: Secondary | ICD-10-CM | POA: Diagnosis not present

## 2023-05-20 DIAGNOSIS — F411 Generalized anxiety disorder: Secondary | ICD-10-CM | POA: Diagnosis not present

## 2023-05-21 ENCOUNTER — Encounter: Payer: Self-pay | Admitting: Oncology

## 2023-05-21 ENCOUNTER — Other Ambulatory Visit (HOSPITAL_COMMUNITY): Payer: Self-pay

## 2023-05-21 NOTE — Telephone Encounter (Signed)
Pharmacy Patient Advocate Encounter  Received notification from [facility] that Prior Authorization for Aimovig Inj 140mg /ML has been APPROVED from 05/13/2023 to 05/12/2024   PA #/Case ID/Reference #: [id number]

## 2023-05-24 ENCOUNTER — Other Ambulatory Visit: Payer: Medicare Other

## 2023-05-24 NOTE — Telephone Encounter (Signed)
Sent my chart message to patient of approval.

## 2023-06-11 DIAGNOSIS — M533 Sacrococcygeal disorders, not elsewhere classified: Secondary | ICD-10-CM | POA: Diagnosis not present

## 2023-06-21 ENCOUNTER — Encounter: Payer: Self-pay | Admitting: Family Medicine

## 2023-06-21 MED ORDER — SUMATRIPTAN SUCCINATE 6 MG/0.5ML ~~LOC~~ SOAJ: 6.0000 mg | Freq: Every day | SUBCUTANEOUS | 1 refills | Status: DC | PRN

## 2023-06-21 NOTE — Telephone Encounter (Signed)
Last seen on 05/12/23 Follow up scheduled on 11/30/23 Last filled on 05/30/23

## 2023-06-23 DIAGNOSIS — G894 Chronic pain syndrome: Secondary | ICD-10-CM | POA: Diagnosis not present

## 2023-06-23 DIAGNOSIS — M533 Sacrococcygeal disorders, not elsewhere classified: Secondary | ICD-10-CM | POA: Diagnosis not present

## 2023-06-23 DIAGNOSIS — M545 Low back pain, unspecified: Secondary | ICD-10-CM | POA: Diagnosis not present

## 2023-06-23 DIAGNOSIS — G8929 Other chronic pain: Secondary | ICD-10-CM | POA: Diagnosis not present

## 2023-06-23 DIAGNOSIS — M5136 Other intervertebral disc degeneration, lumbar region with discogenic back pain only: Secondary | ICD-10-CM | POA: Diagnosis not present

## 2023-06-23 DIAGNOSIS — M47816 Spondylosis without myelopathy or radiculopathy, lumbar region: Secondary | ICD-10-CM | POA: Diagnosis not present

## 2023-06-23 DIAGNOSIS — Z79899 Other long term (current) drug therapy: Secondary | ICD-10-CM | POA: Diagnosis not present

## 2023-06-23 DIAGNOSIS — Z978 Presence of other specified devices: Secondary | ICD-10-CM | POA: Diagnosis not present

## 2023-06-23 DIAGNOSIS — M51369 Other intervertebral disc degeneration, lumbar region without mention of lumbar back pain or lower extremity pain: Secondary | ICD-10-CM | POA: Diagnosis not present

## 2023-07-04 ENCOUNTER — Other Ambulatory Visit: Payer: Self-pay | Admitting: Family Medicine

## 2023-07-12 ENCOUNTER — Other Ambulatory Visit: Payer: Medicare Other

## 2023-07-15 ENCOUNTER — Other Ambulatory Visit: Payer: BC Managed Care – PPO

## 2023-07-15 ENCOUNTER — Other Ambulatory Visit (INDEPENDENT_AMBULATORY_CARE_PROVIDER_SITE_OTHER): Payer: BC Managed Care – PPO

## 2023-07-15 DIAGNOSIS — E038 Other specified hypothyroidism: Secondary | ICD-10-CM

## 2023-07-15 NOTE — Addendum Note (Signed)
Addended by: Vincenza Hews on: 07/15/2023 10:40 AM   Modules accepted: Orders

## 2023-07-16 LAB — TSH: TSH: 2.32 u[IU]/mL (ref 0.450–4.500)

## 2023-07-20 ENCOUNTER — Other Ambulatory Visit: Payer: Self-pay | Admitting: Urology

## 2023-08-12 ENCOUNTER — Other Ambulatory Visit: Payer: Medicare Other

## 2023-08-13 DIAGNOSIS — F603 Borderline personality disorder: Secondary | ICD-10-CM | POA: Diagnosis not present

## 2023-08-13 DIAGNOSIS — F4312 Post-traumatic stress disorder, chronic: Secondary | ICD-10-CM | POA: Diagnosis not present

## 2023-08-13 DIAGNOSIS — F410 Panic disorder [episodic paroxysmal anxiety] without agoraphobia: Secondary | ICD-10-CM | POA: Diagnosis not present

## 2023-08-13 DIAGNOSIS — F411 Generalized anxiety disorder: Secondary | ICD-10-CM | POA: Diagnosis not present

## 2023-08-16 ENCOUNTER — Other Ambulatory Visit: Payer: Self-pay

## 2023-08-16 DIAGNOSIS — E291 Testicular hypofunction: Secondary | ICD-10-CM

## 2023-08-17 ENCOUNTER — Other Ambulatory Visit: Payer: Medicare Other

## 2023-08-17 DIAGNOSIS — E291 Testicular hypofunction: Secondary | ICD-10-CM | POA: Diagnosis not present

## 2023-08-17 NOTE — Progress Notes (Signed)
 08/19/2023 10:01 AM   Cody [name] 1970-09-10 [id number]  Referring provider: Cleatus Arlyss RAMAN, MD [address] [place],  [place] [address]  Urological history: 1. Hypogonadism -contributing factors of age, obesity and chronic opioid use -testosterone  (08/2023) 486 -HCT and hemoglobin (08/2023) 14.7/48.2 -managed with testosterone  cypionate 200 mg/cc, 0.5 cc every 14 days   2. BPH with LU TS -PSA (08/2023) 1.5 -Predominantly obstructive urinary symptoms -prostate volume ~ 33 cc (pelvic MRI 01/2022)  -cysto (08/2022) -mild lateral lobe enlargement of the prostate and mild bladder neck elevation -Tadalafil  5 mg daily and tamsulosin  0.4 mg daily  3. ED -contributing factors of age, testosterone  deficiency, BPH, chronic pain medications, smoking, HLD, HTN, depression, anxiety and hypothyroidism -Tadalafil  5 mg daily  Chief Complaint  Patient presents with   testosterone  deficiency    HPI: Cody [name] is a 53 y.o. male who presents today to for follow up.   Previous records reviewed.     I PSS 3/0  He has no urinary complaints.  Patient denies any modifying or aggravating factors.  Patient denies any recent UTI's, gross hematuria, dysuria or suprapubic/flank pain.  Patient denies any fevers, chills, nausea or vomiting.     IPSS     Row Name 08/19/23 0900         International Prostate Symptom Score   How often have you had the sensation of not emptying your bladder? Not at All     How often have you had to urinate less than every two hours? Less than 1 in 5 times     How often have you found you stopped and started again several times when you urinated? Not at All     How often have you found it difficult to postpone urination? Less than 1 in 5 times     How often have you had a weak urinary stream? Not at All     How often have you had to strain to start urination? Not at All     How many times did you typically get up at night to urinate? 1 Time      Total IPSS Score 3       Quality of Life due to urinary symptoms   If you were to spend the rest of your life with your urinary condition just the way it is now how would you feel about that? Pleased                 Score:  1-7 Mild 8-19 Moderate 20-35 Severe  SHIM 19  He is having a more difficult time achieving erections.  He is taking tadalafil  5 mg daily.  Patient denies any modifying or aggravating factors.  Patient denies any recent UTI's, gross hematuria, dysuria or suprapubic/flank pain.  Patient denies any fevers, chills, nausea or vomiting.     SHIM     Row Name 08/19/23 [phone]         SHIM: Over the last 6 months:   How do you rate your confidence that you could get and keep an erection? Moderate     When you had erections with sexual stimulation, how often were your erections hard enough for penetration (entering your partner)? Most Times (much more than half the time)     During sexual intercourse, how often were you able to maintain your erection after you had penetrated (entered) your partner? Most Times (much more than half the time)  During sexual intercourse, how difficult was it to maintain your erection to completion of intercourse? Slightly Difficult     When you attempted sexual intercourse, how often was it satisfactory for you? 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; herniated disc L5- S1   Degenerative disk disease    Depression    Fracture, mandible (HCC)     Fracture, tibia    Headache    Hyperlipidemia    Hypertension    resolved   Hypothyroidism 05/2007   Dr Elsie Roses (retired)/ Dr Arlyss Solian   Neuromuscular disorder [facility])    PTSD (post-traumatic stress disorder)    Suicidal behavior    a.) ideations with attempt x 1    Surgical History: Past Surgical History:  Procedure Laterality Date   APPENDECTOMY     peritonitis   BACK SURGERY     EVALUATION UNDER ANESTHESIA WITH HEMORRHOIDECTOMY N/A 01/28/2022   Procedure: EXAM UNDER ANESTHESIA WITH HEMORRHOIDECTOMY;  Surgeon: Lane Shope, MD;  Location: [facility] ORS;  Service: General;  Laterality: N/A;   FRACTURE SURGERY     INTRATHECAL PUMP IMPLANTATION  2007   LAMINECTOMY  2004   arterial injury, transfused 7 units pc, implant surgery    MANDIBLE FRACTURE SURGERY     mugged   THORACIC DISCECTOMY     T10   UPPER GASTROINTESTINAL ENDOSCOPY  2006   gastritis    Home Medications:  Allergies as of 08/19/2023       Reactions   Neurontin [gabapentin]    lethargic   Vancomycin    Swelling    Doxycycline     vomiting   Lithium     tremor   Mirtazapine    REACTION: lethargy ( Remeron)        Medication List        Accurate as of August 19, 2023 10:01 AM. If you have any questions, ask your nurse or doctor.          ALPRAZolam  1 MG tablet Commonly known as: XANAX  PLEASE SEE ATTACHED FOR DETAILED DIRECTIONS   AMBULATORY NON FORMULARY MEDICATION 8.608 mg by Intrathecal Infusion route daily. Morphine  Pump Implant   atorvastatin  10 MG tablet Commonly known as: LIPITOR TAKE 1 TABLET BY MOUTH DAILY   B-D 3CC LUER-LOK SYR 21GX1-1/2 21G X 1-1/2 3 ML Misc Generic drug: SYRINGE-NEEDLE (DISP) 3 ML Use this needle to injection   BD SafetyGlide Needle 18G X 1-1/2 Misc Generic drug: NEEDLE (DISP) 18 G Draw up   buPROPion 150 MG 24 hr tablet Commonly known as: WELLBUTRIN XL Take 150 mg by mouth every morning.   busPIRone  10 MG tablet Commonly known as:  BUSPAR  Take 1 tablet (10 mg total) by mouth 2 (two) times daily.   Caplyta  42 MG capsule Generic drug: lumateperone  tosylate Take 42  mg by mouth daily.   Erenumab -aooe 140 MG/ML Soaj Inject 140 mg into the skin every 30 (thirty) days.   lamoTRIgine  100 MG tablet Commonly known as: LAMICTAL  Take 100 mg by mouth 2 (two) times daily.   levothyroxine  75 MCG tablet Commonly known as: SYNTHROID  TAKE 1 TABLET BY MOUTH DAILY  BEFORE BREAKFAST   Nucynta 100 MG Tabs Generic drug: Tapentadol HCl Take 1 tablet by mouth every 6 (six) hours as needed.   polyethylene glycol powder 17 GM/SCOOP powder Commonly known as: GLYCOLAX /MIRALAX  Take 17 g by mouth every other day as needed.   promethazine  25 MG tablet Commonly known as: PHENERGAN  TAKE 1/2 TO 1 TABLET BY MOUTH EVERY 8 HOURS AS NEEDED FOR NAUSEA AND VOMITING   SUMAtriptan  6 MG/0.5ML Soaj Inject 6 mg into the skin daily as needed (with 2nd dose 2 hours after 1st dose if needed.).   tadalafil  10 MG tablet Commonly known as: CIALIS  Take 1 tablet (10 mg total) by mouth daily as needed for erectile dysfunction. What changed:  medication strength how much to take reasons to take this Changed by: Dangelo Guzzetta   tamsulosin  0.4 MG Caps capsule Commonly known as: FLOMAX  TAKE 1 CAPSULE BY MOUTH DAILY  AFTER BREAKFAST   testosterone  cypionate 200 MG/ML injection Commonly known as: DEPOTESTOSTERONE CYPIONATE Inject 0.5 mLs (100 mg total) into the muscle every 14 (fourteen) days.   tiZANidine  4 MG tablet Commonly known as: ZANAFLEX  Take 4 mg by mouth as needed.   topiramate  50 MG tablet Commonly known as: TOPAMAX  Take 50 mg by mouth daily.   Trintellix  10 MG Tabs tablet Generic drug: vortioxetine  HBr Take 10 mg by mouth daily.   Vitamin D  50 MCG (2000 UT) Caps Take 1 capsule by mouth daily.        Allergies:  Allergies  Allergen Reactions   Neurontin [Gabapentin]     lethargic   Vancomycin     Swelling     Doxycycline      vomiting   Lithium      tremor   Mirtazapine     REACTION: lethargy ( Remeron)    Family History: Family History  Problem Relation Age of Onset   Ulcers Mother    Depression Mother    Thyroid  disease Mother        hypo   Stroke Father 1   Thyroid  disease Father        hypo   Esophageal cancer Maternal Aunt    Lung cancer Maternal Grandmother    Thyroid  disease Maternal Grandmother        hypo   Colon cancer Maternal Grandfather    Heart attack Maternal Grandfather 55   Esophageal cancer Maternal Grandfather    Diabetes Neg Hx    Prostate cancer Neg Hx    Kidney cancer Neg Hx    Bladder Cancer Neg Hx    Migraines Neg Hx    Headache Neg Hx    Colon polyps Neg Hx    Rectal cancer Neg Hx    Stomach cancer Neg Hx     Social History:  reports that he quit smoking about 14 years ago. His smoking use included e-cigarettes and cigarettes. He has never been exposed to tobacco smoke. He has never used smokeless tobacco. He reports that he does not drink alcohol and does not use drugs.  ROS: Pertinent ROS in HPI  Physical Exam: BP 124/75   Pulse 99   Ht 5' 5 (1.651 m)   Wt 198  lb (89.8 kg)   BMI 32.95 kg/m   Constitutional:  Well nourished. Alert and oriented, No acute distress. HEENT: [place] AT, moist mucus membranes.  Trachea midline Cardiovascular: No clubbing, cyanosis, or edema. Respiratory: Normal respiratory effort, no increased work of breathing. Neurologic: Grossly intact, no focal deficits, moving all 4 extremities. Psychiatric: Normal mood and affect.   Laboratory Data: Results for orders placed or performed in visit on 08/17/23  PSA   Collection Time: 08/17/23  1:04 PM  Result Value Ref Range   Prostate Specific Ag, Serum 1.5 0.0 - 4.0 ng/mL  Testosterone    Collection Time: 08/17/23  1:04 PM  Result Value Ref Range   Testosterone  486 264 - 916 ng/dL  Hemoglobin and hematocrit, blood   Collection Time: 08/17/23  1:04 PM  Result Value Ref  Range   Hemoglobin 14.7 13.0 - 17.7 g/dL   Hematocrit 51.7 62.4 - 51.0 %   I have reviewed the labs.  Pertinent Imaging: N/A  Assessment & Plan:    1. Testosterone  deficiency  -testosterone  levels therapeutic -Hemoglobin/hematocrit levels normal -Continue testosterone  cypionate 200 mg/milliliters, 0.5 cc every 14 days -Will follow-up testosterone  level, hemoglobin hematocrit in 3 months -He is given hardcopy of these labs so he can get it drawn at the [facility] near his place of work  2. BPH with LUTS -PSA normal -continue conservative management, avoiding bladder irritants and timed voiding's -Continue Cialis  5 mg daily and tamsulosin  0.4 mg daily  3. Erectile dysfunction:    -He is having a more difficult time achieving erections, so we will increase the tadalafil  from 5 mg to 10 mg daily to see if he will have some improvement    Return in about 3 months (around 11/17/2023) for Testosterone  level, hemoglobin and hematocrit only.   These notes generated with voice recognition software. I apologize for typographical errors.  CLOTILDA HELON RIGGERS  [facility] [address]  [address] [place], [place] [address] [phone]

## 2023-08-18 LAB — TESTOSTERONE: Testosterone: 486 ng/dL (ref 264–916)

## 2023-08-18 LAB — PSA: Prostate Specific Ag, Serum: 1.5 ng/mL (ref 0.0–4.0)

## 2023-08-18 LAB — HEMOGLOBIN AND HEMATOCRIT, BLOOD
Hematocrit: 48.2 % (ref 37.5–51.0)
Hemoglobin: 14.7 g/dL (ref 13.0–17.7)

## 2023-08-19 ENCOUNTER — Ambulatory Visit (INDEPENDENT_AMBULATORY_CARE_PROVIDER_SITE_OTHER): Payer: BC Managed Care – PPO | Admitting: Urology

## 2023-08-19 ENCOUNTER — Encounter: Payer: Self-pay | Admitting: Urology

## 2023-08-19 VITALS — BP 124/75 | HR 99 | Ht 65.0 in | Wt 198.0 lb

## 2023-08-19 DIAGNOSIS — E291 Testicular hypofunction: Secondary | ICD-10-CM | POA: Diagnosis not present

## 2023-08-19 DIAGNOSIS — N401 Enlarged prostate with lower urinary tract symptoms: Secondary | ICD-10-CM | POA: Diagnosis not present

## 2023-08-19 DIAGNOSIS — N529 Male erectile dysfunction, unspecified: Secondary | ICD-10-CM

## 2023-08-19 DIAGNOSIS — N138 Other obstructive and reflux uropathy: Secondary | ICD-10-CM

## 2023-08-19 MED ORDER — TADALAFIL 10 MG PO TABS: 10.0000 mg | ORAL_TABLET | Freq: Every day | ORAL | 3 refills | Status: DC | PRN

## 2023-08-23 ENCOUNTER — Other Ambulatory Visit: Payer: Self-pay | Admitting: *Deleted

## 2023-08-23 ENCOUNTER — Encounter: Payer: Self-pay | Admitting: Family Medicine

## 2023-08-23 MED ORDER — SUMATRIPTAN SUCCINATE 6 MG/0.5ML ~~LOC~~ SOAJ: 6.0000 mg | Freq: Every day | SUBCUTANEOUS | 1 refills | Status: DC | PRN

## 2023-09-21 DIAGNOSIS — Z978 Presence of other specified devices: Secondary | ICD-10-CM | POA: Diagnosis not present

## 2023-09-21 DIAGNOSIS — M461 Sacroiliitis, not elsewhere classified: Secondary | ICD-10-CM | POA: Diagnosis not present

## 2023-09-21 DIAGNOSIS — R102 Pelvic and perineal pain: Secondary | ICD-10-CM | POA: Diagnosis not present

## 2023-09-21 DIAGNOSIS — M533 Sacrococcygeal disorders, not elsewhere classified: Secondary | ICD-10-CM | POA: Diagnosis not present

## 2023-09-21 DIAGNOSIS — M25552 Pain in left hip: Secondary | ICD-10-CM | POA: Diagnosis not present

## 2023-09-21 DIAGNOSIS — M25551 Pain in right hip: Secondary | ICD-10-CM | POA: Diagnosis not present

## 2023-09-21 DIAGNOSIS — M545 Low back pain, unspecified: Secondary | ICD-10-CM | POA: Diagnosis not present

## 2023-09-21 DIAGNOSIS — G894 Chronic pain syndrome: Secondary | ICD-10-CM | POA: Diagnosis not present

## 2023-09-21 DIAGNOSIS — M47816 Spondylosis without myelopathy or radiculopathy, lumbar region: Secondary | ICD-10-CM | POA: Diagnosis not present

## 2023-09-23 ENCOUNTER — Other Ambulatory Visit: Payer: Self-pay | Admitting: Urology

## 2023-09-23 DIAGNOSIS — E230 Hypopituitarism: Secondary | ICD-10-CM

## 2023-09-27 ENCOUNTER — Encounter: Payer: Self-pay | Admitting: Oncology

## 2023-09-27 ENCOUNTER — Inpatient Hospital Stay: Payer: Medicare Other | Attending: Oncology | Admitting: Oncology

## 2023-09-27 VITALS — BP 118/80 | HR 90 | Temp 98.1°F | Resp 19 | Ht 65.0 in | Wt 194.0 lb

## 2023-09-27 DIAGNOSIS — D45 Polycythemia vera: Secondary | ICD-10-CM | POA: Insufficient documentation

## 2023-09-27 DIAGNOSIS — N529 Male erectile dysfunction, unspecified: Secondary | ICD-10-CM

## 2023-09-27 DIAGNOSIS — D751 Secondary polycythemia: Secondary | ICD-10-CM | POA: Insufficient documentation

## 2023-09-27 MED ORDER — TADALAFIL 10 MG PO TABS: 10.0000 mg | ORAL_TABLET | Freq: Every day | ORAL | 3 refills | Status: DC | PRN

## 2023-09-27 NOTE — Progress Notes (Signed)
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  He had urine analysis done February 2024 which did not show any evidence of hematuria.  He is on testosterone supplementation for hypogonadism through urology.  Testosterone levels on 11/17/2022 were low at 126 after they were elevated at 1498 in December 2023.   Patient does not smoke cigarettes but does vape.  He was told that he has obstructive sleep apnea in the past but he did not use CPAP.  Currently he reports sleeping well.  He is on testosterone supplementation for the last 1 year or so.  This is being given for opioid-induced hypogonadism.   Results of workup fromApril 2024 showed negative JAK2 mutation and normal EPO levels.  Polycythemia likely secondary due to testosterone replacement therapy.  Target hematocrit less than 50    Interval history-he has been getting about 8-9 migraine episodes every month for which she uses sumatriptan injections and follows up with neurology.  He has also been on a stable dose of  testosterone which has been working well for him.  ECOG PS- 1 Pain scale- 3   Review of systems- Review of Systems  Constitutional:  Negative for chills, fever, malaise/fatigue and weight loss.  HENT:  Negative for congestion, ear discharge and nosebleeds.   Eyes:  Negative for blurred vision.  Respiratory:  Negative for cough, hemoptysis, sputum production, shortness of breath and wheezing.   Cardiovascular:  Negative for chest pain, palpitations, orthopnea and claudication.  Gastrointestinal:  Negative for abdominal pain, blood in stool, constipation, diarrhea, heartburn, melena, nausea and vomiting.  Genitourinary:  Negative for dysuria, flank pain, frequency, hematuria and urgency.  Musculoskeletal:  Negative for back pain, joint pain and myalgias.  Skin:  Negative for rash.  Neurological:  Positive for headaches. Negative for dizziness, tingling, focal weakness, seizures and weakness.  Endo/Heme/Allergies:  Does not bruise/bleed easily.  Psychiatric/Behavioral:  Negative for depression and suicidal ideas. The patient does not have insomnia.       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; herniated disc L5- S1   Degenerative disk disease    Depression    Fracture, mandible (HCC)    Fracture, tibia    Headache    Hyperlipidemia    Hypertension    resolved   Hypothyroidism 05/2007   Dr Marga Melnick (retired)/ Dr Crawford Givens   Neuromuscular disorder [facility])    PTSD (post-traumatic  stress disorder)    Suicidal behavior    a.) ideations with attempt x 1     Past Surgical History:  Procedure Laterality Date   APPENDECTOMY     peritonitis   BACK SURGERY     EVALUATION UNDER ANESTHESIA WITH HEMORRHOIDECTOMY N/A 01/28/2022   Procedure: EXAM UNDER ANESTHESIA WITH HEMORRHOIDECTOMY;  Surgeon: Campbell Lerner, MD;  Location: [facility] ORS;  Service: General;  Laterality: N/A;   FRACTURE SURGERY     INTRATHECAL PUMP IMPLANTATION  2007   LAMINECTOMY  2004   arterial injury, transfused 7 units pc, implant surgery    MANDIBLE FRACTURE SURGERY     mugged   THORACIC DISCECTOMY     T10   UPPER GASTROINTESTINAL ENDOSCOPY  2006   gastritis    Social History   Socioeconomic History   Marital status: Married    Spouse name: Research scientist (physical sciences)   Number of children: 0   Years of education: Not on file   Highest education level: Not on file  Occupational History   Occupation: Disabled  Tobacco Use   Smoking status: Former    Current packs/day: 0.00    Types: E-cigarettes, Cigarettes    Quit date: 08/10/2009    Years since quitting: 14.1    Passive exposure: Never   Smokeless tobacco: Never   Tobacco comments:    smoked age intermittently age [age]; up to 2 cigarettes/ day   Vaping Use   Vaping status: Every Day   Substances: Nicotine, Flavoring  Substance and Sexual Activity   Alcohol use: No   Drug use: No   Sexual activity: Not on file  Other Topics Concern   Not on file  Social History Narrative   From [place].     [address] Grad   To [place] 2000   Disability from bipolar affective disorder, prev accounting.     Married 1997   No kids   Enjoys walking, travel.     Social Drivers of Corporate investment banker Strain: Low Risk  (03/09/2023)   Overall Financial Resource Strain (CARDIA)    Difficulty of Paying Living Expenses: Not hard at all  Food Insecurity: No Food Insecurity (03/09/2023)   Hunger Vital Sign    Worried About Running Out of Food in the Last  Year: Never true    Ran Out of Food in the Last Year: Never true  Transportation Needs: No Transportation Needs (03/09/2023)   PRAPARE - Administrator, Civil Service (Medical): No    Lack of Transportation (Non-Medical): No  Physical Activity: Insufficiently Active (03/09/2023)   Exercise Vital Sign    Days of Exercise per Week: 2 days    Minutes of Exercise per Session: 40 min  Stress: No Stress Concern Present (03/09/2023)   [employer] of Occupational Health - Occupational Stress Questionnaire    Feeling of Stress : Not at all  Social Connections: Moderately Integrated (03/09/2023)   Social Connection and Isolation Panel [NHANES]    Frequency of Communication with Friends and Family: Never    Frequency of Social Gatherings with Friends and Family:  Never    Attends Religious Services: More than 4 times per year    Active Member of Clubs or Organizations: Yes    Attends Banker Meetings: More than 4 times per year    Marital Status: Married  Catering manager Violence: Not At Risk (03/09/2023)   Humiliation, Afraid, Rape, and Kick questionnaire    Fear of Current or Ex-Partner: No    Emotionally Abused: No    Physically Abused: No    Sexually Abused: No    Family History  Problem Relation Age of Onset   Ulcers Mother    Depression Mother    Thyroid disease Mother        hypo   Stroke Father 28   Thyroid disease Father        hypo   Esophageal cancer Maternal Aunt    Lung cancer Maternal Grandmother    Thyroid disease Maternal Grandmother        hypo   Colon cancer Maternal Grandfather    Heart attack Maternal Grandfather 55   Esophageal cancer Maternal Grandfather    Diabetes Neg Hx    Prostate cancer Neg Hx    Kidney cancer Neg Hx    Bladder Cancer Neg Hx    Migraines Neg Hx    Headache Neg Hx    Colon polyps Neg Hx    Rectal cancer Neg Hx    Stomach cancer Neg Hx      Current Outpatient Medications:    ALPRAZolam (XANAX) 1 MG  tablet, PLEASE SEE ATTACHED FOR DETAILED DIRECTIONS, Disp: , Rfl:    AMBULATORY NON FORMULARY MEDICATION, 8.608 mg by Intrathecal Infusion route daily. Morphine Pump Implant, Disp: , Rfl:    atorvastatin (LIPITOR) 10 MG tablet, TAKE 1 TABLET BY MOUTH DAILY, Disp: 90 tablet, Rfl: 3   buPROPion (WELLBUTRIN XL) 150 MG 24 hr tablet, Take 150 mg by mouth every morning., Disp: , Rfl:    busPIRone (BUSPAR) 10 MG tablet, Take 1 tablet (10 mg total) by mouth 2 (two) times daily., Disp: , Rfl:    CAPLYTA 42 MG capsule, Take 42 mg by mouth daily., Disp: , Rfl:    Cholecalciferol (VITAMIN D) 50 MCG (2000 UT) CAPS, Take 1 capsule by mouth daily., Disp: , Rfl:    Erenumab-aooe 140 MG/ML SOAJ, Inject 140 mg into the skin every 30 (thirty) days., Disp: 3 mL, Rfl: 3   lamoTRIgine (LAMICTAL) 100 MG tablet, Take 100 mg by mouth 2 (two) times daily., Disp: , Rfl:    levothyroxine (SYNTHROID) 75 MCG tablet, TAKE 1 TABLET BY MOUTH DAILY  BEFORE BREAKFAST, Disp: 90 tablet, Rfl: 3   NEEDLE, DISP, 18 G (BD SAFETYGLIDE NEEDLE) 18G X 1-1/2" MISC, Draw up, Disp: 50 each, Rfl: 0   NUCYNTA 100 MG TABS, Take 1 tablet by mouth every 6 (six) hours as needed., Disp: , Rfl:    polyethylene glycol powder (GLYCOLAX/MIRALAX) 17 GM/SCOOP powder, Take 17 g by mouth every other day as needed., Disp: , Rfl:    promethazine (PHENERGAN) 25 MG tablet, TAKE 1/2 TO 1 TABLET BY MOUTH EVERY 8 HOURS AS NEEDED FOR NAUSEA AND VOMITING, Disp: 20 tablet, Rfl: 1   SUMAtriptan 6 MG/0.5ML SOAJ, Inject 6 mg into the skin daily as needed (with 2nd dose 2 hours after 1st dose if needed.)., Disp: 5 mL, Rfl: 1   SYRINGE-NEEDLE, DISP, 3 ML (B-D 3CC LUER-LOK SYR 21GX1-1/2) 21G X 1-1/2" 3 ML MISC, Use this needle to injection, Disp: 50 each,  Rfl: 0   tadalafil (CIALIS) 10 MG tablet, Take 1 tablet (10 mg total) by mouth daily as needed for erectile dysfunction., Disp: 90 tablet, Rfl: 3   tamsulosin (FLOMAX) 0.4 MG CAPS capsule, TAKE 1 CAPSULE BY MOUTH DAILY  AFTER  BREAKFAST, Disp: 90 capsule, Rfl: 3   testosterone cypionate (DEPOTESTOSTERONE CYPIONATE) 200 MG/ML injection, INJECT 0.5 MLS (100 MG TOTAL) INTO THE MUSCLE EVERY 14 (FOURTEEN) DAYS., Disp: 6 mL, Rfl: 1   tiZANidine (ZANAFLEX) 4 MG tablet, Take 4 mg by mouth as needed., Disp: , Rfl:    topiramate (TOPAMAX) 50 MG tablet, Take 50 mg by mouth daily., Disp: , Rfl:    vortioxetine HBr (TRINTELLIX) 10 MG TABS tablet, Take 10 mg by mouth daily., Disp: , Rfl:   Physical exam: There were no vitals filed for this visit. Physical Exam Cardiovascular:     Rate and Rhythm: Normal rate and regular rhythm.     Heart sounds: Normal heart sounds.  Pulmonary:     Effort: Pulmonary effort is normal.     Breath sounds: Normal breath sounds.  Skin:    General: Skin is warm and dry.  Neurological:     Mental Status: He is alert and oriented to person, place, and time.         Latest Ref Rng & Units 10/02/2022    2:31 PM  CMP  Glucose 70 - 99 mg/dL 161   BUN 6 - 24 mg/dL 15   Creatinine 0.96 - 1.27 mg/dL 0.45   Sodium 409 - 811 mmol/L 146   Potassium 3.5 - 5.2 mmol/L 3.9   Chloride 96 - 106 mmol/L 104   CO2 20 - 29 mmol/L 22   Calcium 8.7 - 10.2 mg/dL 9.2       Latest Ref Rng & Units 08/17/2023    1:04 PM  CBC  Hemoglobin 13.0 - 17.7 g/dL 91.4   Hematocrit 78.2 - 51.0 % 48.2      Assessment and plan- Patient is a 53 y.o. male with history of secondary polycythemia likely due to testosterone replacement therapy versus obstructive sleep apnea here for routine follow-up  Target hemoglobin for phlebotomy is presently less than 50.  His hematocrit was 54 back in April 2024 but since then it has remained less than 50 and he has not required a phlebotomy.  H&H from January 2025 showed hematocrit of less than 50.Testosterone level is in normal and PSA levels have also remained stable and within normal limits.  CBC in 3 months in 6 months and I will see him back in 6 months   Visit Diagnosis 1.  Polycythemia, secondary      Dr. Owens Shark, MD, MPH [facility] [phone] 09/27/2023 12:56 PM

## 2023-09-30 ENCOUNTER — Other Ambulatory Visit: Payer: Self-pay

## 2023-09-30 DIAGNOSIS — N529 Male erectile dysfunction, unspecified: Secondary | ICD-10-CM

## 2023-09-30 MED ORDER — TADALAFIL 10 MG PO TABS: 10.0000 mg | ORAL_TABLET | Freq: Every day | ORAL | 3 refills | Status: DC | PRN

## 2023-10-11 DIAGNOSIS — F410 Panic disorder [episodic paroxysmal anxiety] without agoraphobia: Secondary | ICD-10-CM | POA: Diagnosis not present

## 2023-10-11 DIAGNOSIS — F603 Borderline personality disorder: Secondary | ICD-10-CM | POA: Diagnosis not present

## 2023-10-11 DIAGNOSIS — F4312 Post-traumatic stress disorder, chronic: Secondary | ICD-10-CM | POA: Diagnosis not present

## 2023-10-11 DIAGNOSIS — F411 Generalized anxiety disorder: Secondary | ICD-10-CM | POA: Diagnosis not present

## 2023-10-25 ENCOUNTER — Other Ambulatory Visit: Payer: Self-pay | Admitting: *Deleted

## 2023-10-25 MED ORDER — SUMATRIPTAN SUCCINATE 6 MG/0.5ML ~~LOC~~ SOAJ: 6.0000 mg | Freq: Every day | SUBCUTANEOUS | 1 refills | Status: DC | PRN

## 2023-11-02 ENCOUNTER — Other Ambulatory Visit: Payer: Self-pay | Admitting: Family Medicine

## 2023-11-04 ENCOUNTER — Telehealth: Payer: Self-pay

## 2023-11-04 NOTE — Telephone Encounter (Signed)
 Message sent to front to schedule pt for an appointment with Dr. Para March.  Last seen 03/09/22.  Needs appointment for refills.

## 2023-11-04 NOTE — Telephone Encounter (Signed)
 LVM for patient to c/b and schedule.

## 2023-11-04 NOTE — Telephone Encounter (Signed)
 Pt needs an appointment for refills. Last seen by Dr. Para March 03/09/22. We cannot refill medication until he is seen.

## 2023-11-05 NOTE — Telephone Encounter (Signed)
 Patient scheduled.

## 2023-11-05 NOTE — Telephone Encounter (Signed)
 Patient has been scheduled

## 2023-11-09 ENCOUNTER — Encounter: Payer: Self-pay | Admitting: Family Medicine

## 2023-11-09 ENCOUNTER — Ambulatory Visit (INDEPENDENT_AMBULATORY_CARE_PROVIDER_SITE_OTHER): Admitting: Family Medicine

## 2023-11-09 VITALS — BP 134/76 | HR 89 | Temp 98.8°F | Ht 65.0 in | Wt 205.8 lb

## 2023-11-09 DIAGNOSIS — E785 Hyperlipidemia, unspecified: Secondary | ICD-10-CM

## 2023-11-09 DIAGNOSIS — R7989 Other specified abnormal findings of blood chemistry: Secondary | ICD-10-CM

## 2023-11-09 DIAGNOSIS — F3170 Bipolar disorder, currently in remission, most recent episode unspecified: Secondary | ICD-10-CM

## 2023-11-09 DIAGNOSIS — G894 Chronic pain syndrome: Secondary | ICD-10-CM

## 2023-11-09 MED ORDER — ATORVASTATIN CALCIUM 10 MG PO TABS: 10.0000 mg | ORAL_TABLET | Freq: Every day | ORAL | 3 refills | Status: DC

## 2023-11-09 NOTE — Patient Instructions (Signed)
 Go to the lab on the way out.   If you have mychart we'll likely use that to update you.    Take care.  Glad to see you.

## 2023-11-09 NOTE — Progress Notes (Unsigned)
 Elevated Cholesterol: Using medications without problems:yes Muscle aches: likely not from statin.  Diet compliance: d/w pt.  Exercise: d/w pt.   Labs pending.   Hypogonadism per urology. He was taking 0.75mg  testosterone every 14 days with normal levels.  He had some acneiform changes with testosterone use on the back prev, more recently near his ears.    He has dilaudid pump per outside clinic.  Plan for SI fusion.    Xanax/wellbutrin/buspar/capylta per outside clinic.  I asked him to clarify his current meds.  D/w pt.    Meds, vitals, and allergies reviewed.   ROS: Per HPI unless specifically indicated in ROS section   GEN: nad, alert and oriented HEENT: mucous membranes moist NECK: supple w/o LA CV: rrr. PULM: ctab, no inc wob ABD: soft, +bs EXT: no edema SKIN: no acute rash other than mild acneiform changes near the ears bilaterally.  30 minutes were devoted to patient care in this encounter (this includes time spent reviewing the patient's file/history, interviewing and examining the patient, counseling/reviewing plan with patient).

## 2023-11-10 DIAGNOSIS — G8929 Other chronic pain: Secondary | ICD-10-CM | POA: Diagnosis not present

## 2023-11-10 DIAGNOSIS — M48061 Spinal stenosis, lumbar region without neurogenic claudication: Secondary | ICD-10-CM | POA: Diagnosis not present

## 2023-11-10 DIAGNOSIS — M5137 Other intervertebral disc degeneration, lumbosacral region with discogenic back pain only: Secondary | ICD-10-CM | POA: Diagnosis not present

## 2023-11-10 DIAGNOSIS — M533 Sacrococcygeal disorders, not elsewhere classified: Secondary | ICD-10-CM | POA: Diagnosis not present

## 2023-11-10 DIAGNOSIS — M545 Low back pain, unspecified: Secondary | ICD-10-CM | POA: Diagnosis not present

## 2023-11-10 DIAGNOSIS — M5186 Other intervertebral disc disorders, lumbar region: Secondary | ICD-10-CM | POA: Diagnosis not present

## 2023-11-10 DIAGNOSIS — M5187 Other intervertebral disc disorders, lumbosacral region: Secondary | ICD-10-CM | POA: Diagnosis not present

## 2023-11-10 LAB — COMPREHENSIVE METABOLIC PANEL WITH GFR
ALT: 47 IU/L — ABNORMAL HIGH (ref 0–44)
AST: 33 IU/L (ref 0–40)
Albumin: 4.7 g/dL (ref 3.8–4.9)
Alkaline Phosphatase: 85 IU/L (ref 44–121)
BUN/Creatinine Ratio: 5 — ABNORMAL LOW (ref 9–20)
BUN: 8 mg/dL (ref 6–24)
Bilirubin Total: 0.3 mg/dL (ref 0.0–1.2)
CO2: 25 mmol/L (ref 20–29)
Calcium: 9.5 mg/dL (ref 8.7–10.2)
Chloride: 106 mmol/L (ref 96–106)
Creatinine, Ser: 1.49 mg/dL — ABNORMAL HIGH (ref 0.76–1.27)
Globulin, Total: 2 g/dL (ref 1.5–4.5)
Glucose: 110 mg/dL — ABNORMAL HIGH (ref 70–99)
Potassium: 4.5 mmol/L (ref 3.5–5.2)
Sodium: 144 mmol/L (ref 134–144)
Total Protein: 6.7 g/dL (ref 6.0–8.5)
eGFR: 56 mL/min/{1.73_m2} — ABNORMAL LOW (ref >59)

## 2023-11-10 LAB — LIPID PANEL
Chol/HDL Ratio: 4.1 ratio (ref 0.0–5.0)
Cholesterol, Total: 115 mg/dL (ref 100–199)
HDL: 28 mg/dL — ABNORMAL LOW (ref >39)
LDL Chol Calc (NIH): 65 mg/dL (ref 0–99)
Triglycerides: 123 mg/dL (ref 0–149)
VLDL Cholesterol Cal: 22 mg/dL (ref 5–40)

## 2023-11-10 NOTE — Assessment & Plan Note (Signed)
 With Dilaudid pump per outside clinic.  He has a plan for potential SI fusion.  Discussed with patient.  I will defer to outside clinic.

## 2023-11-10 NOTE — Assessment & Plan Note (Signed)
 Per outside clinic.  I will defer.  See above.  No suicidal homicidal intent and okay for outpatient follow-up.

## 2023-11-10 NOTE — Assessment & Plan Note (Signed)
Continue atorvastatin.  See notes on labs. 

## 2023-11-10 NOTE — Assessment & Plan Note (Signed)
 Discussed having the patient follow-up with urology about his dosing.  I will defer otherwise.

## 2023-11-14 ENCOUNTER — Encounter: Payer: Self-pay | Admitting: Family Medicine

## 2023-11-17 ENCOUNTER — Ambulatory Visit: Payer: Self-pay | Admitting: Urology

## 2023-11-23 NOTE — Progress Notes (Signed)
 Chief Complaint  Patient presents with   Follow-up    Pt in room 1. Alone. Here for migraine follow up.  Pt reports migraines are bad lately due to weather, still on Amovig. Averages about 8-9 migraines per month.    HISTORY OF PRESENT ILLNESS:  11/30/23 ALL:  Cody [name] returns for follow up for migraines. He was last seen 05/2023 and we switched him back to Amovig. Sumatriptan  continued. Topirmate 50mg  daily continued per PTSD through psychiatry. He feels migraines are fairly well managed on current treatment. Migraines have been worse over the past month or so due to weather changes. He averages about 8 migraines per month. Sumatriptan  injections help abort migraine. He feels mood is stable. Pain management switched pain pump to dilaudid  (from morphine ).   05/12/2023 ALL:  Cody [name] returns for follow up for migraines. We last saw him 10/2022 and switched back to Emgality  injections. Insurance preferred Qulipta  or Nurtec every other day. We started Qulipta  daily. He called 03/2023 reporting side effects with Qulipta . Since, he reports continued headaches. He has some sort of headache every day. He reports sound and light sensitivity with most. He has nausea as well. He feels that he was doing better on Amovig. He request we restart. Sumatriptan  injections abort migraines. He is using 8-9 injections a month. He continues topiramate  50mg  daily through psychiatry. He is seen every 2-3 months. He has had more stressors with home repairs over the summer. He does not feel counseling will help.   He was previously receiving ketamine  treatments for pain management and reports depression was significantly improved. He had to stop due to cost. He is hoping to resume in the future.   10/22/2022 ALL: Cody [name] returns for follow up for migraines. He was last seen 09/2021 and doing well on Emgality  every 30 days. We had to switch him to Amovig 03/2022 due to insurance. PCP continues sumatriptan  injections and phenergan  PRN. He  reports headaches are significantly worse. He is having nearly daily headaches. He is taking sumatriptan  injections regularly. He has filled both autoinjector and vials. He reports PCP gave him 100mg  tablets that he is taking as well. He reports significant anxiety.   Topiramate  added 02/2022 by psychiatry for PTSD. He is followed by every 4-6 weeks. He is not seeing psychology right now but does feel he needs to see a therapist. He continues Trintellix , Wellbutrin, Buspar  and lamotrigine . He is also seeing pain management and recently started on Nucynta.   OSA diagnosed in 2021 but he could not tolerate CPAP therapy. He lost about 50lbs since study. He does not wish to repeat HST.   10/02/2021 ALL: Cody [name] returns for follow up for migraines. We switched him from Ajovy  to Emgality  in 05/2021. Since, he reports headaches have reduced by about 50%. He is very pleased with response. He does report injections sting a little but are tolerable. He is not having to use Imitrex .   He is not using CPAP. Two day HST showed AHI 26/hr with nadir of 84%. He was advised to start CPAP but reports difficulty with DME and he did not wish to continue. He has lost about 50 pounds. He decreased caloric intake. He feels that appetite suppression started due to nausea with headaches. Weight has been stable for the past 3-4 months. He continues to see psychiatry and pain management. He is getting ketamine  treatments PRN and feels that this is helping significantly with depression and pain.   05/29/2021 ALL:  Cody [name]  is a 53 y.o. male here today for follow up for migraines. He was started on Ajovy  following consult with Dr Tresia Fruit 11/2020. MRI was normal. He feels that some months are better than others. He has tolerated Ajovy  but not sure it working as well as he would like. Last injection Migraines are always at night. He has about 7-8 per month. He usually vomits with migraines. He is followed by Dr Helaine Llanos with [facility]. Dr Vallarie Gauze follows for PCP needs. He is seen by psychiatry, unsure of name as he was recently switched to new provider. He is considering ketamine  treatments. Sumatriptan  and phenergan  prescribed by PCP helps with abortive therapy.    HISTORY (copied from Dr Harding Li previous note)  HPI:  Cody [name] is a 53 y.o. male here as requested by Donnie Galea, MD for headache. PMHx already evaluated by neurology for headache and severe sleep apnea Dr. Frederic Jakes clinic 2021, MRI and MRV ordered, PTSD, hypothyroidism, diagnosed with nonepileptic spells by Dr. Deveron Fly, diagnosed with severe sleep apnea by Dr. Mason Sole May 2021 a CPAP machine was ordered for him, chronic pain on a morphine  pump, hyperlipidemia, depression, degenerative disc disease, chronic back pain, bipolar 2, anxiety, cigarette smoker, obesity, chronic pain, headache.  I reviewed Dr. Gwynda Leriche notes: He seen on August 30, 2020 complaining of headache for about a week, at the base of the skull, radiates over the right more than left temple, right side of the head, he vomited from pain in the prior 24 hours, use cold compress, later in bed in a dark room, he was able to sleep some and the pain got better, but the headache continued frontal right side, no known fever, stiff neck no speech or motor changes.  In April 2021 he had a normal MRI of the brain.  Per Dr. Gwynda Leriche evaluation, concerning for migraine, he was prescribed Imitrex  and Phenergan  as needed and referred to neurology.   Patient has been seen by neurology in the past, in January 2021 he was seen at [facility] by Dr. Mason Sole, for episodic confusion, more so in the morning with associated disequilibrium, history of bipolar disorder and substance abuse, chronic pain on morphine  pump with some episodes of seizure-like activity likely nonepileptic spells.  Dr. Mason Sole recommended EEG, home sleep study, neurocognitive testing, head CT October 2020 was unremarkable.  They  ordered home sleep test, speech therapy for cognitive training, neuropsychology for neurocognitive testing, they checked B12, B1, folate, RPR, Lyme, ACE, ESR, CRP, ANA, IFE, rheumatoid arthritis factor.   Patient is here alone and reports: The first headache was in January of this year, "out of the blue", 2-3 times a week, 4-7/10 in pain, Imitrex  helps and sp does promethazine . He has nausea and vomiting, worst headache of life so severe he was terrified, they are on the right side and on the top, pulsating and pounding and throbbing, excruciating, he may see sparkles in the eyes he also gets pain in the back of the neck and pressure, he takes imitrext at that point. The headaches can last 24-48 hours, with treatment under a day, photophobia/phonophobia, since January 1/2 the month of migraines, he is worried, he can't drive with the migraines, no family history, acute onset, no medication overuse, does not always have an aura. He is trying to examine his lifestyle, stress, sleep and nothing has been discovered, they come after 12, black out helps, right eye water, not stabbing, he has pressure behind his eye.  He has numbness in the right side of the scalp and face with the headaches, but no focal weakness. No other focal neurologic deficits, associated symptoms, inciting events or modifiable factors.   Reviewed notes, labs and imaging from outside physicians, which showed:   CMP with elevated glucose 06/13/2020 otherwise unremarkable   Celexa, lexapro , lithium , prednisone , propranolol, seroquel , imitrex , lamictal , amitriptyline, topamax  with lamictal  contraindicated   MRI brain 11/23/2019: FINDINGS: Brain: There is no acute infarction or intracranial hemorrhage. There is no intracranial mass, mass effect, or edema. There is no hydrocephalus or extra-axial fluid collection. Ventricles and sulci are normal in size and configuration. No abnormal enhancement.   Vascular: Major vessel flow voids at the  skull base are preserved.   Skull and upper cervical spine: Normal marrow signal is preserved.   Sinuses/Orbits: Paranasal sinuses are aerated. Orbits are unremarkable.   Other: Sella is unremarkable.  Mastoid air cells are clear.   IMPRESSION: Normal MRI of the brain.   REVIEW OF SYSTEMS: Out of a complete 14 system review of symptoms, the patient complains only of the following symptoms, headaches, chronic pain, anxiety, depression and all other reviewed systems are negative.   ALLERGIES: Allergies  Allergen Reactions   Neurontin [Gabapentin]     lethargic   Vancomycin     Swelling    Doxycycline      vomiting   Lithium      tremor   Mirtazapine     REACTION: lethargy ( Remeron)     HOME MEDICATIONS: Outpatient Medications Prior to Visit  Medication Sig Dispense Refill   ALPRAZolam  (XANAX ) 1 MG tablet PLEASE SEE ATTACHED FOR DETAILED DIRECTIONS     AMBULATORY NON FORMULARY MEDICATION by Intrathecal Infusion route daily. dilaudid  Pump Implant     atorvastatin  (LIPITOR) 10 MG tablet Take 1 tablet (10 mg total) by mouth daily. 90 tablet 3   buPROPion (WELLBUTRIN XL) 150 MG 24 hr tablet Take 150 mg by mouth every morning.     busPIRone  (BUSPAR ) 10 MG tablet Take 1 tablet (10 mg total) by mouth 2 (two) times daily.     CAPLYTA  42 MG capsule Take 42 mg by mouth daily.     Cholecalciferol  (VITAMIN D ) 50 MCG (2000 UT) CAPS Take 1 capsule by mouth daily.     lamoTRIgine  (LAMICTAL ) 100 MG tablet Take 100 mg by mouth 2 (two) times daily.     levothyroxine  (SYNTHROID ) 75 MCG tablet TAKE 1 TABLET BY MOUTH DAILY  BEFORE BREAKFAST 90 tablet 3   NUCYNTA 100 MG TABS Take 1 tablet by mouth every 6 (six) hours as needed.     PAIN MANAGEMENT INTRATHECAL, IT, PUMP 1 each by Intrathecal route. dilaudid  pump     polyethylene glycol powder (GLYCOLAX /MIRALAX ) 17 GM/SCOOP powder Take 17 g by mouth every other day as needed.     promethazine  (PHENERGAN ) 25 MG tablet TAKE 1/2 TO 1 TABLET BY MOUTH  EVERY 8 HOURS AS NEEDED FOR NAUSEA AND VOMITING 20 tablet 1   REXULTI 1 MG TABS tablet Take by mouth.     tadalafil  (CIALIS ) 10 MG tablet Take 1 tablet (10 mg total) by mouth daily as needed for erectile dysfunction. 90 tablet 3   tamsulosin  (FLOMAX ) 0.4 MG CAPS capsule TAKE 1 CAPSULE BY MOUTH DAILY  AFTER BREAKFAST 90 capsule 3   testosterone  cypionate (DEPOTESTOSTERONE CYPIONATE) 200 MG/ML injection INJECT 0.5 MLS (100 MG TOTAL) INTO THE MUSCLE EVERY 14 (FOURTEEN) DAYS. 6 mL 1   tiZANidine  (ZANAFLEX ) 4 MG tablet Take  4 mg by mouth as needed.     topiramate  (TOPAMAX ) 50 MG tablet Take 50 mg by mouth daily.     vortioxetine  HBr (TRINTELLIX ) 10 MG TABS tablet Take 10 mg by mouth daily.     Erenumab -aooe 140 MG/ML SOAJ Inject 140 mg into the skin every 30 (thirty) days. 3 mL 3   SUMAtriptan  6 MG/0.5ML SOAJ Inject 6 mg into the skin daily as needed (with 2nd dose 2 hours after 1st dose if needed.). 5 mL 1   NEEDLE, DISP, 18 G (BD SAFETYGLIDE NEEDLE) 18G X 1-1/2" MISC Draw up 50 each 0   SYRINGE-NEEDLE, DISP, 3 ML (B-D 3CC LUER-LOK SYR 21GX1-1/2) 21G X 1-1/2" 3 ML MISC Use this needle to injection 50 each 0   No facility-administered medications prior to visit.     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; herniated disc L5- S1   Degenerative disk disease    Depression    Fracture, mandible (HCC)    Fracture, tibia    Headache    Hyperlipidemia    Hypertension    resolved   Hypothyroidism 05/2007   Dr Loetta Ringer (retired)/ Dr Richrd Char   Neuromuscular disorder [facility])    PTSD (post-traumatic stress disorder)    Suicidal behavior    a.) ideations with attempt x 1      PAST SURGICAL HISTORY: Past Surgical History:  Procedure Laterality Date   APPENDECTOMY     peritonitis   BACK SURGERY     EVALUATION UNDER ANESTHESIA WITH HEMORRHOIDECTOMY N/A 01/28/2022   Procedure: EXAM UNDER ANESTHESIA WITH HEMORRHOIDECTOMY;  Surgeon: Flynn Hylan, MD;  Location: [facility] ORS;  Service: General;  Laterality: N/A;   FRACTURE SURGERY     INTRATHECAL PUMP IMPLANTATION  2007   LAMINECTOMY  2004   arterial injury, transfused 7 units pc, implant surgery    MANDIBLE FRACTURE SURGERY     mugged   THORACIC DISCECTOMY     T10   UPPER GASTROINTESTINAL ENDOSCOPY  2006   gastritis     FAMILY HISTORY: Family History  Problem Relation Age of Onset   Ulcers Mother    Depression Mother    Thyroid  disease Mother        hypo   Stroke Father 15   Thyroid  disease Father        hypo   Esophageal cancer Maternal Aunt    Lung cancer Maternal Grandmother    Thyroid  disease Maternal Grandmother        hypo   Colon cancer Maternal Grandfather    Heart attack Maternal Grandfather 55   Esophageal cancer Maternal Grandfather    Diabetes Neg Hx    Prostate cancer Neg Hx    Kidney cancer Neg Hx    Bladder Cancer Neg Hx    Migraines Neg Hx    Headache Neg Hx    Colon polyps Neg Hx    Rectal cancer Neg Hx    Stomach cancer Neg Hx      SOCIAL HISTORY: Social History   Socioeconomic History   Marital status: Married  Spouse name: Pattie Borders   Number of children: 0   Years of education: Not on file   Highest education level: Not on file  Occupational History   Occupation: Disabled  Tobacco Use   Smoking status: Former    Current packs/day: 0.00    Types: E-cigarettes, Cigarettes    Quit date: 08/10/2009    Years since quitting: 14.3    Passive exposure: Never   Smokeless tobacco: Never   Tobacco comments:    smoked age intermittently age [age]; up to 2 cigarettes/ day   Vaping Use   Vaping status: Every Day   Substances: Nicotine , Flavoring   Substance and Sexual Activity   Alcohol use: No   Drug use: No   Sexual activity: Not on file  Other Topics Concern   Not on file  Social History Narrative   From [place] .     [employer]   To [place] 2000   Disability from bipolar affective disorder, prev accounting.     Married 1997   No kids   Enjoys walking, travel.     Social Drivers of Corporate investment banker Strain: Low Risk  (03/09/2023)   Overall Financial Resource Strain (CARDIA)    Difficulty of Paying Living Expenses: Not hard at all  Food Insecurity: No Food Insecurity (03/09/2023)   Hunger Vital Sign    Worried About Running Out of Food in the Last Year: Never true    Ran Out of Food in the Last Year: Never true  Transportation Needs: No Transportation Needs (03/09/2023)   PRAPARE - Administrator, Civil Service (Medical): No    Lack of Transportation (Non-Medical): No  Physical Activity: Insufficiently Active (03/09/2023)   Exercise Vital Sign    Days of Exercise per Week: 2 days    Minutes of Exercise per Session: 40 min  Stress: No Stress Concern Present (03/09/2023)   [employer] of Occupational Health - Occupational Stress Questionnaire    Feeling of Stress : Not at all  Social Connections: Moderately Integrated (03/09/2023)   Social Connection and Isolation Panel [NHANES]    Frequency of Communication with Friends and Family: Never    Frequency of Social Gatherings with Friends and Family: Never    Attends Religious Services: More than 4 times per year    Active Member of [employer] or Organizations: Yes    Attends Engineer, structural: More than 4 times per year    Marital Status: Married  Catering manager Violence: Not At Risk (03/09/2023)   Humiliation, Afraid, Rape, and Kick questionnaire    Fear of Current or Ex-Partner: No    Emotionally Abused: No    Physically Abused: No    Sexually Abused: No     PHYSICAL EXAM  Vitals:   11/30/23 0825  BP: 117/82  Pulse: 83   Weight: 202 lb 12.8 oz (92 kg)  Height: 5\' 5"  (1.651 m)       Body mass index is 33.75 kg/m.  Generalized: Well developed, in no acute distress  Cardiology: normal rate and rhythm, no murmur auscultated  Respiratory: clear to auscultation bilaterally    Neurological examination  Mentation: Alert oriented to time, place, history taking. Follows all commands speech and language fluent Cranial nerve II-XII: Pupils were equal round reactive to light. Extraocular movements were full, visual field were full on confrontational test. Facial sensation and strength were normal. Head turning and shoulder shrug  were normal and symmetric. Motor: The motor testing reveals  5 over 5 strength of all 4 extremities. Good symmetric motor tone is noted throughout.  Gait and station: Gait is normal.    DIAGNOSTIC DATA (LABS, IMAGING, TESTING) - I reviewed patient records, labs, notes, testing and imaging myself where available.  Lab Results  Component Value Date   WBC 8.2 03/03/2022   HGB 14.7 08/17/2023   HCT 48.2 08/17/2023   MCV 89 03/03/2022   PLT 188 03/03/2022      Component Value Date/Time   NA 144 11/09/2023 1339   NA 142 03/23/2014 1752   K 4.5 11/09/2023 1339   K 3.9 03/23/2014 1752   CL 106 11/09/2023 1339   CL 103 03/23/2014 1752   CO2 25 11/09/2023 1339   CO2 28 03/23/2014 1752   GLUCOSE 110 (H) 11/09/2023 1339   GLUCOSE 130 (H) 02/11/2022 0412   GLUCOSE 166 (H) 03/23/2014 1752   BUN 8 11/09/2023 1339   BUN 14 03/23/2014 1752   CREATININE 1.49 (H) 11/09/2023 1339   CREATININE 1.14 12/09/2016 0000   CREATININE 1.33 (H) 03/23/2014 1752   CALCIUM  9.5 11/09/2023 1339   CALCIUM  9.2 03/23/2014 1752   PROT 6.7 11/09/2023 1339   PROT 8.3 (H) 03/23/2014 1752   ALBUMIN 4.7 11/09/2023 1339   ALBUMIN 4.2 03/23/2014 1752   AST 33 11/09/2023 1339   AST 22 12/09/2016 0000   ALT 47 (H) 11/09/2023 1339   ALT 22 12/09/2016 0000   ALT 84 (H) 03/23/2014 1752   ALKPHOS 85  11/09/2023 1339   ALKPHOS 76 03/23/2014 1752   BILITOT 0.3 11/09/2023 1339   BILITOT 0.5 03/23/2014 1752   GFRNONAA >60 02/11/2022 0412   GFRNONAA >60 03/23/2014 1752   GFRAA 117 06/13/2020 1347   GFRAA >60 03/23/2014 1752   Lab Results  Component Value Date   CHOL 115 11/09/2023   HDL 28 (L) 11/09/2023   LDLCALC 65 11/09/2023   LDLDIRECT 176.4 10/11/2012   TRIG 123 11/09/2023   CHOLHDL 4.1 11/09/2023   Lab Results  Component Value Date   HGBA1C 5.6 07/23/2021   No results found for: "VITAMINB12" Lab Results  Component Value Date   TSH 2.320 07/15/2023       02/28/2018   12:15 PM  MMSE - Mini Mental State Exam  Orientation to time 5  Orientation to Place 5  Registration 3  Attention/ Calculation 0  Recall 3  Language- name 2 objects 0  Language- repeat 1  Language- follow 3 step command 3  Language- read & follow direction 0  Write a sentence 0  Copy design 0  Total score 20         No data to display           ASSESSMENT AND PLAN  53 y.o. year old male  has a past medical history of Anxiety, Bipolar 2 disorder (HCC) (03/2009), Chronic back pain, Chronic pain syndrome, Complication of anesthesia, DDD (degenerative disc disease), Degenerative disk disease, Depression, Fracture, mandible (HCC), Fracture, tibia, Headache, Hyperlipidemia, Hypertension, Hypothyroidism (05/2007), Neuromuscular disorder (HCC), PTSD (post-traumatic stress disorder), and Suicidal behavior. here with    Chronic migraine without aura without status migrainosus, not intractable - Plan: Erenumab -aooe 140 MG/ML SOAJ  Tim reports doing well. We will continue Amovig 140mg  every 30 days. He will continue sumatriptan  injections sparingly. He will continue phenergan  as prescribed by PCP.  He will continue close follow up with psychiatry and I have encouraged him to consider increased dose of topiramate  if headaches worsen and psychaitry  agrees. He was unable to tolerate CPAP. He has lost 50  pounds. I have encouraged him to consider repeat testing. He will let me know if he decides to pursue. He will continue close follow up with PCP, pain management and psychiatry. Follow up with me in 1 year.    No orders of the defined types were placed in this encounter.     Meds ordered this encounter  Medications   Erenumab -aooe 140 MG/ML SOAJ    Sig: Inject 140 mg into the skin every 30 (thirty) days.    Dispense:  3 mL    Refill:  3    Supervising Provider:   AHERN, ANTONIA B [[id number]]   SUMAtriptan  6 MG/0.5ML SOAJ    Sig: Inject 6 mg into the skin daily as needed (with 2nd dose 2 hours after 1st dose if needed.).    Dispense:  5 mL    Refill:  5    Supervising Provider:   Glory Larsen [[id number]]       Terrilyn Fick, MSN, FNP-C 11/30/2023, 8:51 AM  [facility] [address] [place], [place] [address] [phone]

## 2023-11-23 NOTE — Patient Instructions (Addendum)
 Below is our plan:  We will continue Amovig injections every 30 days. Continue topiramate  per psychiatry but ask if she is okay with you increasing this to 100mg  daily to help with migraines. Continue sumatriptan  injections as needed but try to avoid taking if not needed. Please take 1 injection at onset of headache. May take 1 additional injection in 2 hours if needed. Do not take more than 2 injections in 24 hours or more than 10 in a month.   Please make sure you are staying well hydrated. I recommend 50-60 ounces daily. Well balanced diet and regular exercise encouraged. Consistent sleep schedule with 6-8 hours recommended.   Please continue follow up with care team as directed.   Follow up with me in 1 year   You may receive a survey regarding today's visit. I encourage you to leave honest feed back as I do use this information to improve patient care. Thank you for seeing me today!   GENERAL HEADACHE INFORMATION:   Natural supplements: Magnesium Oxide or Magnesium Glycinate 500 mg at bed (up to 800 mg daily) Coenzyme Q10 300 mg in AM Vitamin B2- 200 mg twice a day   Add 1 supplement at a time since even natural supplements can have undesirable side effects. You can sometimes buy supplements cheaper (especially Coenzyme Q10) at www.WebmailGuide.co.za or at [facility].  Migraine with aura: There is increased risk for stroke in women with migraine with aura and a contraindication for the combined contraceptive pill for use by women who have migraine with aura. The risk for women with migraine without aura is lower. However other risk factors like smoking are far more likely to increase stroke risk than migraine. There is a recommendation for no smoking and for the use of OCPs without estrogen such as progestogen only pills particularly for women with migraine with aura.Aaron Aas People who have migraine headaches with auras may be 3 times more likely to have a stroke caused by a blood clot, compared to migraine  patients who don't see auras. Women who take hormone-replacement therapy may be 30 percent more likely to suffer a clot-based stroke than women not taking medication containing estrogen. Other risk factors like smoking and high blood pressure may be  much more important.    Vitamins and herbs that show potential:   Magnesium: Magnesium (250 mg twice a day or 500 mg at bed) has a relaxant effect on smooth muscles such as blood vessels. Individuals suffering from frequent or daily headache usually have low magnesium levels which can be increase with daily supplementation of 400-750 mg. Three trials found 40-90% average headache reduction  when used as a preventative. Magnesium may help with headaches are aura, the best evidence for magnesium is for migraine with aura is its thought to stop the cortical spreading depression we believe is the pathophysiology of migraine aura.Magnesium also demonstrated the benefit in menstrually related migraine.  Magnesium is part of the messenger system in the serotonin cascade and it is a good muscle relaxant.  It is also useful for constipation which can be a side effect of other medications used to treat migraine. Good sources include nuts, whole grains, and tomatoes. Side Effects: loose stool/diarrhea  Riboflavin (vitamin B 2) 200 mg twice a day. This vitamin assists nerve cells in the production of ATP a principal energy storing molecule.  It is necessary for many chemical reactions in the body.  There have been at least 3 clinical trials of riboflavin using 400 mg per  day all of which suggested that migraine frequency can be decreased.  All 3 trials showed significant improvement in over half of migraine sufferers.  The supplement is found in bread, cereal, milk, meat, and poultry.  Most Americans get more riboflavin than the recommended daily allowance, however riboflavin deficiency is not necessary for the supplements to help prevent headache. Side effects: energizing,  green urine   Coenzyme Q10: This is present in almost all cells in the body and is critical component for the conversion of energy.  Recent studies have shown that a nutritional supplement of CoQ10 can reduce the frequency of migraine attacks by improving the energy production of cells as with riboflavin.  Doses of 150 mg twice a day have been shown to be effective.   Melatonin: Increasing evidence shows correlation between melatonin secretion and headache conditions.  Melatonin supplementation has decreased headache intensity and duration.  It is widely used as a sleep aid.  Sleep is natures way of dealing with migraine.  A dose of 3 mg is recommended to start for headaches including cluster headache. Higher doses up to 15 mg has been reviewed for use in Cluster headache and have been used. The rationale behind using melatonin for cluster is that many theories regarding the cause of Cluster headache center around the disruption of the normal circadian rhythm in the brain.  This helps restore the normal circadian rhythm.   HEADACHE DIET: Foods and beverages which may trigger migraine Note that only 20% of headache patients are food sensitive. You will know if you are food sensitive if you get a headache consistently 20 minutes to 2 hours after eating a certain food. Only cut out a food if it causes headaches, otherwise you might remove foods you enjoy! What matters most for diet is to eat a well balanced healthy diet full of vegetables and low fat protein, and to not miss meals.   Chocolate, other sweets ALL cheeses except cottage and cream cheese Dairy products, yogurt, sour cream, ice cream Liver Meat extracts (Bovril, Marmite, meat tenderizers) Meats or fish which have undergone aging, fermenting, pickling or smoking. These include: Hotdogs,salami,Lox,sausage, mortadellas,smoked salmon, pepperoni, Pickled herring Pods of broad bean (English beans, Chinese pea pods, [place] (fava) beans, lima and  navy beans Ripe avocado, ripe banana Yeast extracts or active yeast preparations such as Brewer's or Fleishman's (commercial bakes goods are permitted) Tomato based foods, pizza (lasagna, etc.)   MSG (monosodium glutamate) is disguised as many things; look for these common aliases: Monopotassium glutamate Autolysed yeast Hydrolysed protein Sodium caseinate "flavorings" "all natural preservatives" Nutrasweet   Avoid all other foods that convincingly provoke headaches.   Resources: The Dizzy Althia Jetty Your Headache Diet, migrainestrong.com  https://zamora-andrews.com/   Caffeine and Migraine For patients that have migraine, caffeine intake more than 3 days per week can lead to dependency and increased migraine frequency. I would recommend cutting back on your caffeine intake as best you can. The recommended amount of caffeine is 200-300 mg daily, although migraine patients may experience dependency at even lower doses. While you may notice an increase in headache temporarily, cutting back will be helpful for headaches in the long run. For more information on caffeine and migraine, visit: https://americanmigrainefoundation.org/resource-library/caffeine-and-migraine/   Headache Prevention Strategies:   1. Maintain a headache diary; learn to identify and avoid triggers.  - This can be a simple note where you log when you had a headache, associated symptoms, and medications used - There are several smartphone apps developed to  help track migraines: Migraine Buddy, Migraine Monitor, Curelator N1-Headache App   Common triggers include: Emotional triggers: Emotional/Upset family or friends Emotional/Upset occupation Business reversal/success Anticipation anxiety Crisis-serious Post-crisis periodNew job/position   Physical triggers: Vacation Day Weekend Strenuous Exercise High Altitude Location New Move Menstrual Day Physical  Illness Oversleep/Not enough sleep Weather changes Light: Photophobia or light sesnitivity treatment involves a balance between desensitization and reduction in overly strong input. Use dark polarized glasses outside, but not inside. Avoid bright or fluorescent light, but do not dim environment to the point that going into a normally lit room hurts. Consider FL-41 tint lenses, which reduce the most irritating wavelengths without blocking too much light.  These can be obtained at axonoptics.com or theraspecs.com Foods: see list above.   2. Limit use of acute treatments (over-the-counter medications, triptans, etc.) to no more than 2 days per week or 10 days per month to prevent medication overuse headache (rebound headache).     3. Follow a regular schedule (including weekends and holidays): Don't skip meals. Eat a balanced diet. 8 hours of sleep nightly. Minimize stress. Exercise 30 minutes per day. Being overweight is associated with a 5 times increased risk of chronic migraine. Keep well hydrated and drink 6-8 glasses of water per day.   4. Initiate non-pharmacologic measures at the earliest onset of your headache. Rest and quiet environment. Relax and reduce stress. Breathe2Relax is a free app that can instruct you on    some simple relaxtion and breathing techniques. Http://Dawnbuse.com is a    free website that provides teaching videos on relaxation.  Also, there are  many apps that   can be downloaded for "mindful" relaxation.  An app called YOGA NIDRA will help walk you through mindfulness. Another app called Calm can be downloaded to give you a structured mindfulness guide with daily reminders and skill development. Headspace for guided meditation Mindfulness Based Stress Reduction Online Course: www.palousemindfulness.com Cold compresses.   5. Don't wait!! Take the maximum allowable dosage of prescribed medication at the first sign of migraine.   6. Compliance:  Take prescribed  medication regularly as directed and at the first sign of a migraine.   7. Communicate:  Call your physician when problems arise, especially if your headaches change, increase in frequency/severity, or become associated with neurological symptoms (weakness, numbness, slurred speech, etc.). Proceed to emergency room if you experience new or worsening symptoms or symptoms do not resolve, if you have new neurologic symptoms or if headache is severe, or for any concerning symptom.   8. Headache/pain management therapies: Consider various complementary methods, including medication, behavioral therapy, psychological counselling, biofeedback, massage therapy, acupuncture, dry needling, and other modalities.  Such measures may reduce the need for medications. Counseling for pain management, where patients learn to function and ignore/minimize their pain, seems to work very well.   9. Recommend changing family's attention and focus away from patient's headaches. Instead, emphasize daily activities. If first question of day is 'How are your headaches/Do you have a headache today?', then patient will constantly think about headaches, thus making them worse. Goal is to re-direct attention away from headaches, toward daily activities and other distractions.   10. Helpful Websites: www.AmericanHeadacheSociety.org PatentHood.ch www.headaches.org TightMarket.nl www.achenet.org

## 2023-11-30 ENCOUNTER — Ambulatory Visit (INDEPENDENT_AMBULATORY_CARE_PROVIDER_SITE_OTHER): Payer: Medicare Other | Admitting: Family Medicine

## 2023-11-30 ENCOUNTER — Encounter: Payer: Self-pay | Admitting: Family Medicine

## 2023-11-30 DIAGNOSIS — G43709 Chronic migraine without aura, not intractable, without status migrainosus: Secondary | ICD-10-CM

## 2023-11-30 MED ORDER — SUMATRIPTAN SUCCINATE 6 MG/0.5ML ~~LOC~~ SOAJ: 6.0000 mg | Freq: Every day | SUBCUTANEOUS | 5 refills | Status: DC | PRN

## 2023-11-30 MED ORDER — ERENUMAB-AOOE 140 MG/ML ~~LOC~~ SOAJ: 140.0000 mg | SUBCUTANEOUS | 3 refills | Status: AC

## 2023-12-01 NOTE — Progress Notes (Unsigned)
 12/02/2023 3:33 PM   Cody [name] July 25, 1971 [id number]  Referring provider: Donnie Galea, MD [address] [place],  [place] [address]  Urological history: 1. Hypogonadism -testosterone  pending  -HCT and hemoglobin pending  -managed with testosterone  cypionate 200 mg/cc, 0.5 cc every 14 days   2. BPH with LU TS -PSA pending  -prostate volume ~ 33 cc (pelvic MRI 01/2022)  -cysto (08/2022) -mild lateral lobe enlargement of the prostate and mild bladder neck elevation -Tadalafil  5 mg daily and tamsulosin  0.4 mg daily  3. ED -Tadalafil  5 mg daily  No chief complaint on file.   HPI: Cody [name] is a 53 y.o. male who presents today to for follow up.   Previous records reviewed.     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; herniated disc L5- S1   Degenerative disk disease    Depression    Fracture, mandible (HCC)    Fracture, tibia    Headache    Hyperlipidemia    Hypertension    resolved   Hypothyroidism 05/2007   Dr Loetta Ringer (retired)/ Dr Richrd Char   Neuromuscular disorder [facility])    PTSD (post-traumatic stress disorder)    Suicidal behavior    a.) ideations with attempt x 1    Surgical History: Past Surgical History:  Procedure Laterality Date   APPENDECTOMY     peritonitis   BACK SURGERY     EVALUATION UNDER ANESTHESIA WITH HEMORRHOIDECTOMY N/A 01/28/2022   Procedure: EXAM UNDER ANESTHESIA WITH HEMORRHOIDECTOMY;  Surgeon: Flynn Hylan, MD;  Location:  [facility] ORS;  Service: General;  Laterality: N/A;   FRACTURE SURGERY     INTRATHECAL PUMP IMPLANTATION  2007   LAMINECTOMY  2004   arterial injury, transfused 7 units pc, implant surgery    MANDIBLE FRACTURE SURGERY     mugged   THORACIC DISCECTOMY     T10   UPPER GASTROINTESTINAL ENDOSCOPY  2006   gastritis    Home Medications:  Allergies as of 12/02/2023       Reactions   Neurontin [gabapentin]    lethargic   Vancomycin    Swelling    Doxycycline     vomiting   Lithium     tremor   Mirtazapine    REACTION: lethargy ( Remeron)        Medication List        Accurate as of December 01, 2023  3:33 PM. If you have any questions, ask your nurse or doctor.          ALPRAZolam  1 MG tablet Commonly known as: XANAX  PLEASE SEE ATTACHED FOR DETAILED DIRECTIONS   AMBULATORY NON FORMULARY MEDICATION by Intrathecal Infusion route daily. dilaudid  Pump Implant   atorvastatin  10 MG tablet Commonly known as: LIPITOR Take 1 tablet (10 mg total) by mouth daily.   buPROPion 150 MG 24  hr tablet Commonly known as: WELLBUTRIN XL Take 150 mg by mouth every morning.   busPIRone  10 MG tablet Commonly known as: BUSPAR  Take 1 tablet (10 mg total) by mouth 2 (two) times daily.   Caplyta  42 MG capsule Generic drug: lumateperone  tosylate Take 42 mg by mouth daily.   Erenumab -aooe 140 MG/ML Soaj Inject 140 mg into the skin every 30 (thirty) days.   lamoTRIgine  100 MG tablet Commonly known as: LAMICTAL  Take 100 mg by mouth 2 (two) times daily.   levothyroxine  75 MCG tablet Commonly known as: SYNTHROID  TAKE 1 TABLET BY MOUTH DAILY  BEFORE BREAKFAST   Nucynta 100 MG Tabs Generic drug: Tapentadol HCl Take 1 tablet by mouth every 6 (six) hours as needed.   PAIN MANAGEMENT INTRATHECAL (IT) PUMP 1 each by Intrathecal route. dilaudid  pump   polyethylene glycol powder 17 GM/SCOOP powder Commonly known as: GLYCOLAX /MIRALAX  Take 17 g by mouth every other day as needed.    promethazine  25 MG tablet Commonly known as: PHENERGAN  TAKE 1/2 TO 1 TABLET BY MOUTH EVERY 8 HOURS AS NEEDED FOR NAUSEA AND VOMITING   Rexulti 1 MG Tabs tablet Generic drug: brexpiprazole Take by mouth.   SUMAtriptan  6 MG/0.5ML Soaj Inject 6 mg into the skin daily as needed (with 2nd dose 2 hours after 1st dose if needed.).   tadalafil  10 MG tablet Commonly known as: CIALIS  Take 1 tablet (10 mg total) by mouth daily as needed for erectile dysfunction.   tamsulosin  0.4 MG Caps capsule Commonly known as: FLOMAX  TAKE 1 CAPSULE BY MOUTH DAILY  AFTER BREAKFAST   testosterone  cypionate 200 MG/ML injection Commonly known as: DEPOTESTOSTERONE CYPIONATE INJECT 0.5 MLS (100 MG TOTAL) INTO THE MUSCLE EVERY 14 (FOURTEEN) DAYS.   tiZANidine  4 MG tablet Commonly known as: ZANAFLEX  Take 4 mg by mouth as needed.   topiramate  50 MG tablet Commonly known as: TOPAMAX  Take 50 mg by mouth daily.   Trintellix  10 MG Tabs tablet Generic drug: vortioxetine  HBr Take 10 mg by mouth daily.   Vitamin D  50 MCG (2000 UT) Caps Take 1 capsule by mouth daily.        Allergies:  Allergies  Allergen Reactions   Neurontin [Gabapentin]     lethargic   Vancomycin     Swelling    Doxycycline      vomiting   Lithium      tremor   Mirtazapine     REACTION: lethargy ( Remeron)    Family History: Family History  Problem Relation Age of Onset   Ulcers Mother    Depression Mother    Thyroid  disease Mother        hypo   Stroke Father 94   Thyroid  disease Father        hypo   Esophageal cancer Maternal Aunt    Lung cancer Maternal Grandmother    Thyroid  disease Maternal Grandmother        hypo   Colon cancer Maternal Grandfather    Heart attack Maternal Grandfather 63   Esophageal cancer Maternal Grandfather    Diabetes Neg Hx    Prostate cancer Neg Hx    Kidney cancer Neg Hx    Bladder Cancer Neg Hx    Migraines Neg Hx    Headache Neg Hx    Colon polyps Neg Hx    Rectal cancer Neg  Hx    Stomach cancer Neg Hx     Social History:  reports that he quit smoking about 14 years ago. His  smoking use included e-cigarettes and cigarettes. He has never been exposed to tobacco smoke. He has never used smokeless tobacco. He reports that he does not drink alcohol and does not use drugs.  ROS: Pertinent ROS in HPI  Physical Exam: There were no vitals taken for this visit.  Constitutional:  Well nourished. Alert and oriented, No acute distress. HEENT: [place] AT, moist mucus membranes.  Trachea midline, no masses. Cardiovascular: No clubbing, cyanosis, or edema. Respiratory: Normal respiratory effort, no increased work of breathing. GI: Abdomen is soft, non tender, non distended, no abdominal masses. Liver and spleen not palpable.  No hernias appreciated.  Stool sample for occult testing is not indicated.   GU: No CVA tenderness.  No bladder fullness or masses.  Patient with circumcised/uncircumcised phallus. ***Foreskin easily retracted***  Urethral meatus is patent.  No penile discharge. No penile lesions or rashes. Scrotum without lesions, cysts, rashes and/or edema.  Testicles are located scrotally bilaterally. No masses are appreciated in the testicles. Left and right epididymis are normal. Rectal: Patient with  normal sphincter tone. Anus and perineum without scarring or rashes. No rectal masses are appreciated. Prostate is approximately *** grams, *** nodules are appreciated. Seminal vesicles are normal. Skin: No rashes, bruises or suspicious lesions. Lymph: No cervical or inguinal adenopathy. Neurologic: Grossly intact, no focal deficits, moving all 4 extremities. Psychiatric: Normal mood and affect.   Laboratory Data: Results for orders placed or performed in visit on 11/09/23  Comprehensive metabolic panel with GFR   Collection Time: 11/09/23  1:39 PM  Result Value Ref Range   Glucose 110 (H) 70 - 99 mg/dL   BUN 8 6 - 24 mg/dL   Creatinine, Ser 8.11 (H) 0.76 - 1.27 mg/dL    eGFR 56 (L) >91 YN/WGN/5.62   BUN/Creatinine Ratio 5 (L) 9 - 20   Sodium 144 134 - 144 mmol/L   Potassium 4.5 3.5 - 5.2 mmol/L   Chloride 106 96 - 106 mmol/L   CO2 25 20 - 29 mmol/L   Calcium  9.5 8.7 - 10.2 mg/dL   Total Protein 6.7 6.0 - 8.5 g/dL   Albumin 4.7 3.8 - 4.9 g/dL   Globulin, Total 2.0 1.5 - 4.5 g/dL   Bilirubin Total 0.3 0.0 - 1.2 mg/dL   Alkaline Phosphatase 85 44 - 121 IU/L   AST 33 0 - 40 IU/L   ALT 47 (H) 0 - 44 IU/L  Lipid panel   Collection Time: 11/09/23  1:39 PM  Result Value Ref Range   Cholesterol, Total 115 100 - 199 mg/dL   Triglycerides 130 0 - 149 mg/dL   HDL 28 (L) >86 mg/dL   VLDL Cholesterol Cal 22 5 - 40 mg/dL   LDL Chol Calc (NIH) 65 0 - 99 mg/dL   Chol/HDL Ratio 4.1 0.0 - 5.0 ratio   I have reviewed the labs.  Pertinent Imaging: N/A  Assessment & Plan:    1. Hypogonadism  -testosterone  levels pending  -Hemoglobin/hematocrit levels pending  -Continue testosterone  cypionate 200 mg/milliliters, 0.5 cc every 14 days  2. BPH with LUTS -PSA pending  -continue conservative management, avoiding bladder irritants and timed voiding's -Continue Cialis  5 mg daily and tamsulosin  0.4 mg daily  3. Erectile dysfunction:    -He is having a more difficult time achieving erections, so we will increase the tadalafil  from 5 mg to 10 mg daily to see if he will have some improvement    No follow-ups on file.   These notes generated with  voice recognition software. I apologize for typographical errors.  Briant Camper  [facility] [address]  [address] [place], [place] [address] [phone]

## 2023-12-02 ENCOUNTER — Ambulatory Visit (INDEPENDENT_AMBULATORY_CARE_PROVIDER_SITE_OTHER): Admitting: Urology

## 2023-12-02 ENCOUNTER — Other Ambulatory Visit: Payer: Self-pay | Admitting: Urology

## 2023-12-02 ENCOUNTER — Encounter: Payer: Self-pay | Admitting: Urology

## 2023-12-02 VITALS — BP 124/87 | HR 101

## 2023-12-02 DIAGNOSIS — N529 Male erectile dysfunction, unspecified: Secondary | ICD-10-CM

## 2023-12-02 DIAGNOSIS — N138 Other obstructive and reflux uropathy: Secondary | ICD-10-CM | POA: Diagnosis not present

## 2023-12-02 DIAGNOSIS — N401 Enlarged prostate with lower urinary tract symptoms: Secondary | ICD-10-CM | POA: Diagnosis not present

## 2023-12-02 DIAGNOSIS — E230 Hypopituitarism: Secondary | ICD-10-CM

## 2023-12-02 DIAGNOSIS — E291 Testicular hypofunction: Secondary | ICD-10-CM | POA: Diagnosis not present

## 2023-12-02 MED ORDER — TESTOSTERONE CYPIONATE 200 MG/ML IM SOLN: 100.0000 mg | INTRAMUSCULAR | 1 refills | Status: DC

## 2023-12-02 MED ORDER — SILDENAFIL CITRATE 100 MG PO TABS: 100.0000 mg | ORAL_TABLET | Freq: Every day | ORAL | 3 refills | Status: AC | PRN

## 2023-12-03 LAB — TESTOSTERONE: Testosterone: 1184 ng/dL — ABNORMAL HIGH (ref 264–916)

## 2023-12-03 LAB — PSA: Prostate Specific Ag, Serum: 2.2 ng/mL (ref 0.0–4.0)

## 2023-12-07 DIAGNOSIS — M533 Sacrococcygeal disorders, not elsewhere classified: Secondary | ICD-10-CM | POA: Diagnosis not present

## 2023-12-10 DIAGNOSIS — F411 Generalized anxiety disorder: Secondary | ICD-10-CM | POA: Diagnosis not present

## 2023-12-10 DIAGNOSIS — F603 Borderline personality disorder: Secondary | ICD-10-CM | POA: Diagnosis not present

## 2023-12-10 DIAGNOSIS — F410 Panic disorder [episodic paroxysmal anxiety] without agoraphobia: Secondary | ICD-10-CM | POA: Diagnosis not present

## 2023-12-10 DIAGNOSIS — F4312 Post-traumatic stress disorder, chronic: Secondary | ICD-10-CM | POA: Diagnosis not present

## 2023-12-20 ENCOUNTER — Other Ambulatory Visit: Payer: Self-pay | Admitting: Urology

## 2023-12-30 ENCOUNTER — Other Ambulatory Visit

## 2024-01-04 ENCOUNTER — Other Ambulatory Visit

## 2024-01-04 DIAGNOSIS — E291 Testicular hypofunction: Secondary | ICD-10-CM | POA: Diagnosis not present

## 2024-01-04 DIAGNOSIS — N401 Enlarged prostate with lower urinary tract symptoms: Secondary | ICD-10-CM | POA: Diagnosis not present

## 2024-01-04 DIAGNOSIS — N529 Male erectile dysfunction, unspecified: Secondary | ICD-10-CM | POA: Diagnosis not present

## 2024-01-04 DIAGNOSIS — N138 Other obstructive and reflux uropathy: Secondary | ICD-10-CM | POA: Diagnosis not present

## 2024-01-05 ENCOUNTER — Ambulatory Visit: Payer: Self-pay | Admitting: Urology

## 2024-01-05 ENCOUNTER — Other Ambulatory Visit: Payer: Self-pay | Admitting: Family Medicine

## 2024-01-05 LAB — TESTOSTERONE: Testosterone: 34 ng/dL — ABNORMAL LOW (ref 264–916)

## 2024-01-06 NOTE — Telephone Encounter (Signed)
 Pt LM on triage line stating that he has tried gels and creams for TRT in the past without success. He would like to try another round if injections. Please advise.

## 2024-01-07 ENCOUNTER — Other Ambulatory Visit: Payer: Self-pay | Admitting: Urology

## 2024-01-07 DIAGNOSIS — E291 Testicular hypofunction: Secondary | ICD-10-CM

## 2024-01-07 MED ORDER — TESTOSTERONE CYPIONATE 200 MG/ML IM SOLN: INTRAMUSCULAR | 0 refills | Status: DC

## 2024-01-07 NOTE — Progress Notes (Unsigned)
 I have sent in a refill for his testosterone  to [facility] and I need to get him in for blood work 7 days after an injection.  I have placed orders.

## 2024-01-17 ENCOUNTER — Telehealth: Payer: Self-pay | Admitting: Urology

## 2024-01-17 NOTE — Telephone Encounter (Signed)
 Patient requests phone call to discuss when he needs to schedule another appt to have testosterone  checked. Please advise.

## 2024-01-18 NOTE — Telephone Encounter (Signed)
 LM informing pt that SM would need to see him for OV.   Pt has lab appt tomorrow. Advise to schedule OV at that time.

## 2024-01-19 ENCOUNTER — Other Ambulatory Visit

## 2024-01-19 DIAGNOSIS — E291 Testicular hypofunction: Secondary | ICD-10-CM

## 2024-01-20 ENCOUNTER — Ambulatory Visit: Payer: Self-pay | Admitting: Urology

## 2024-01-20 LAB — TESTOSTERONE: Testosterone: 580 ng/dL (ref 264–916)

## 2024-01-20 LAB — HEMOGLOBIN AND HEMATOCRIT, BLOOD
Hematocrit: 47.5 % (ref 37.5–51.0)
Hemoglobin: 14.6 g/dL (ref 13.0–17.7)

## 2024-01-21 NOTE — Telephone Encounter (Signed)
Pt has an appt on 7/1 

## 2024-02-08 ENCOUNTER — Ambulatory Visit (INDEPENDENT_AMBULATORY_CARE_PROVIDER_SITE_OTHER): Admitting: Urology

## 2024-02-08 VITALS — BP 131/84 | HR 112 | Ht 66.0 in | Wt 179.8 lb

## 2024-02-08 DIAGNOSIS — N401 Enlarged prostate with lower urinary tract symptoms: Secondary | ICD-10-CM | POA: Diagnosis not present

## 2024-02-08 DIAGNOSIS — N529 Male erectile dysfunction, unspecified: Secondary | ICD-10-CM | POA: Diagnosis not present

## 2024-02-08 DIAGNOSIS — N138 Other obstructive and reflux uropathy: Secondary | ICD-10-CM | POA: Diagnosis not present

## 2024-02-08 DIAGNOSIS — E291 Testicular hypofunction: Secondary | ICD-10-CM | POA: Diagnosis not present

## 2024-02-09 DIAGNOSIS — F4312 Post-traumatic stress disorder, chronic: Secondary | ICD-10-CM | POA: Diagnosis not present

## 2024-02-09 DIAGNOSIS — F410 Panic disorder [episodic paroxysmal anxiety] without agoraphobia: Secondary | ICD-10-CM | POA: Diagnosis not present

## 2024-02-09 DIAGNOSIS — F603 Borderline personality disorder: Secondary | ICD-10-CM | POA: Diagnosis not present

## 2024-02-09 DIAGNOSIS — F411 Generalized anxiety disorder: Secondary | ICD-10-CM | POA: Diagnosis not present

## 2024-02-10 DIAGNOSIS — M47816 Spondylosis without myelopathy or radiculopathy, lumbar region: Secondary | ICD-10-CM | POA: Diagnosis not present

## 2024-02-10 DIAGNOSIS — M545 Low back pain, unspecified: Secondary | ICD-10-CM | POA: Diagnosis not present

## 2024-02-10 DIAGNOSIS — M533 Sacrococcygeal disorders, not elsewhere classified: Secondary | ICD-10-CM | POA: Diagnosis not present

## 2024-02-13 ENCOUNTER — Encounter: Payer: Self-pay | Admitting: Urology

## 2024-02-13 NOTE — Progress Notes (Signed)
 02/08/2024 4:49 PM   Evalene PARAS [place] Jul 06, 1971 [id number]  Referring provider: Cleatus Arlyss RAMAN, MD [address] [place],  [place] [address]  Urological history: 1. Hypogonadism -testosterone  (01/2024) 580 -hemoglobin/hematocrit (01/2024) 14.6/47.5 -managed with testosterone  cypionate 200 mg/cc, 0.5 cc every 7 days   2. BPH with LU TS -PSA (11/2023) 2.2  -prostate volume ~ 33 cc (pelvic MRI 01/2022)  -cysto (08/2022) -mild lateral lobe enlargement of the prostate and mild bladder neck elevation -Tadalafil  10 mg daily   3. ED -Tadalafil  10 mg daily  Chief Complaint  Patient presents with   Follow-up    HPI: AHLIJAH RAIA is a 53 y.o. male who presents today to for follow up.   Previous records reviewed.     He is feeling well.  He reports good energy.  He is not having issues with his testosterone  injections.    Major complaint(s):  Mild urinary incontinence x several years. Denies any dysuria, hematuria or suprapubic pain.      Currently taking: tadalafil  10 mg daily.   Denies any recent fevers, chills, nausea or vomiting.  He has a family history of colon cancer in maternal grandmother.    Serum creatinine (11/2023) 1.49, eGFR 56   IPSS     Row Name 02/08/24 1500         International Prostate Symptom Score   How often have you had the sensation of not emptying your bladder? Not at All     How often have you had to urinate less than every two hours? Less than 1 in 5 times     How often have you found you stopped and started again several times when you urinated? Less than half the time     How often have you found it difficult to postpone urination? Not at All     How often have you had a weak urinary stream? Not at All     How often have you had to strain to start urination? Not at All     How many times did you typically get up at night to urinate? None     Total IPSS Score 3       Quality of Life due to urinary symptoms   If you were to  spend the rest of your life with your urinary condition just the way it is now how would you feel about that? Terrible        Score:  1-7 Mild 8-19 Moderate 20-35 Severe  Main complaint: Achieving and maintaining an erection  x 10  years Risk factors:  age, BPH, hypogonadism and pain medication  No painful erections or curvatures with his erections.    No longer having spontaneous erections.  Tried:    sildenafil  100 mg, on-demand-dosing and tadalafil  10 mg daily   Testosterone  (01/2024) 580   SHIM     Row Name 02/08/24 1541         SHIM: Over the last 6 months:   How do you rate your confidence that you could get and keep an erection? Very High     When you had erections with sexual stimulation, how often were your erections hard enough for penetration (entering your partner)? Almost Always or Always     During sexual intercourse, how often were you able to maintain your erection after you had penetrated (entered) your partner? Almost Always or Always     During sexual intercourse, how difficult was it to  maintain your erection to completion of intercourse? Not Difficult     When you attempted sexual intercourse, how often was it satisfactory for you? 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; herniated disc L5- S1   Degenerative disk disease    Depression    Fracture, mandible (HCC)    Fracture, tibia    Headache    Hyperlipidemia    Hypertension    resolved   Hypothyroidism 05/2007   Dr Elsie Roses (retired)/ Dr  Arlyss Solian   Neuromuscular disorder [facility])    PTSD (post-traumatic stress disorder)    Suicidal behavior    a.) ideations with attempt x 1    Surgical History: Past Surgical History:  Procedure Laterality Date   APPENDECTOMY     peritonitis   BACK SURGERY     EVALUATION UNDER ANESTHESIA WITH HEMORRHOIDECTOMY N/A 01/28/2022   Procedure: EXAM UNDER ANESTHESIA WITH HEMORRHOIDECTOMY;  Surgeon: Lane Shope, MD;  Location: [facility] ORS;  Service: General;  Laterality: N/A;   FRACTURE SURGERY     INTRATHECAL PUMP IMPLANTATION  2007   LAMINECTOMY  2004   arterial injury, transfused 7 units pc, implant surgery    MANDIBLE FRACTURE SURGERY     mugged   THORACIC DISCECTOMY     T10   UPPER GASTROINTESTINAL ENDOSCOPY  2006   gastritis    Home Medications:  Allergies as of 02/08/2024       Reactions   Neurontin [gabapentin]    lethargic   Vancomycin    Swelling    Doxycycline     vomiting   Lithium     tremor   Mirtazapine    REACTION: lethargy ( Remeron)        Medication List        Accurate as of February 08, 2024 11:59 PM. If you have any questions, ask your nurse or doctor.          ALPRAZolam  1 MG tablet Commonly known as: XANAX  PLEASE SEE ATTACHED FOR DETAILED DIRECTIONS   AMBULATORY NON FORMULARY MEDICATION by Intrathecal Infusion route daily. dilaudid  Pump Implant   atorvastatin  10 MG tablet Commonly known as: LIPITOR Take 1 tablet (10 mg total) by mouth daily.   busPIRone  10 MG tablet Commonly known as: BUSPAR  Take 1 tablet (10 mg total) by mouth 2 (two) times daily.   Caplyta  42 MG capsule Generic drug: lumateperone  tosylate Take 42 mg by mouth daily.   Erenumab -aooe 140 MG/ML Soaj Inject 140 mg into the skin every 30 (thirty) days.   lamoTRIgine  100 MG tablet Commonly known as: LAMICTAL  Take 100 mg by mouth 2 (two) times daily.   levothyroxine  75 MCG tablet Commonly known as: SYNTHROID  TAKE 1 TABLET BY MOUTH DAILY  BEFORE BREAKFAST   Nucynta  100 MG Tabs Generic drug: Tapentadol HCl Take 1 tablet by mouth every 6 (six) hours as needed.   PAIN  MANAGEMENT INTRATHECAL (IT) PUMP 1 each by Intrathecal route. dilaudid  pump   polyethylene glycol powder 17 GM/SCOOP powder Commonly known as: GLYCOLAX /MIRALAX  Take 17 g by mouth every other day as needed.   Rexulti 1 MG Tabs tablet Generic drug: brexpiprazole Take by mouth.   sildenafil  100 MG tablet Commonly known as: VIAGRA  Take 1 tablet (100 mg total) by mouth daily as needed for erectile dysfunction.   SUMAtriptan  6 MG/0.5ML Soaj Inject 6 mg into the skin daily as needed (with 2nd dose 2 hours after 1st dose if needed.).   testosterone  cypionate 200 MG/ML injection Commonly known as: DEPOTESTOSTERONE CYPIONATE Inject 0.5 cc every 7 days   tiZANidine  4 MG tablet Commonly known as: ZANAFLEX  Take 4 mg by mouth as needed.   topiramate  50 MG tablet Commonly known as: TOPAMAX  Take 50 mg by mouth daily.   Trintellix  10 MG Tabs tablet Generic drug: vortioxetine  HBr Take 10 mg by mouth daily.   Vitamin D  50 MCG (2000 UT) Caps Take 1 capsule by mouth daily.        Allergies:  Allergies  Allergen Reactions   Neurontin [Gabapentin]     lethargic   Vancomycin     Swelling    Doxycycline      vomiting   Lithium      tremor   Mirtazapine     REACTION: lethargy ( Remeron)    Family History: Family History  Problem Relation Age of Onset   Ulcers Mother    Depression Mother    Thyroid  disease Mother        hypo   Stroke Father 7   Thyroid  disease Father        hypo   Esophageal cancer Maternal Aunt    Lung cancer Maternal Grandmother    Thyroid  disease Maternal Grandmother        hypo   Colon cancer Maternal Grandfather    Heart attack Maternal Grandfather 49   Esophageal cancer Maternal Grandfather    Diabetes Neg Hx    Prostate cancer Neg Hx    Kidney cancer Neg Hx    Bladder Cancer Neg Hx    Migraines Neg Hx    Headache Neg Hx    Colon polyps  Neg Hx    Rectal cancer Neg Hx    Stomach cancer Neg Hx     Social History:  reports that he quit smoking about 14 years ago. His smoking use included e-cigarettes and cigarettes. He has never been exposed to tobacco smoke. He has never used smokeless tobacco. He reports that he does not drink alcohol and does not use drugs.  ROS: Pertinent ROS in HPI  Physical Exam: BP 131/84   Pulse (!) 112   Ht 5' 6 (1.676 m)   Wt 179 lb 12.8 oz (81.6 kg)   BMI 29.02 kg/m   Constitutional:  Well nourished. Alert and oriented, No acute distress. HEENT: [place] AT, moist mucus membranes.  Trachea midline Cardiovascular: No clubbing, cyanosis, or edema. Respiratory: Normal respiratory effort, no increased work of breathing. GU: No CVA tenderness.  No bladder fullness or masses.  Patient with circumcised phallus.  Urethral meatus is patent.  No penile discharge. No penile lesions or rashes. Scrotum without lesions, cysts, rashes and/or edema.  Testicles are located scrotally bilaterally. No masses are appreciated in the testicles. Left and right epididymis are normal. Rectal: Patient with  normal sphincter tone. Anus and perineum without scarring or rashes. No rectal masses are appreciated. Prostate is approximately 45  grams, np nodules are appreciated. Seminal vesicles could not be palpated Neurologic: Grossly intact, no focal deficits, moving all 4 extremities. Psychiatric: Normal mood and affect.   Laboratory Data: See EPIC and HPI  I have reviewed the labs.  Pertinent Imaging: N/A  Assessment & Plan:    1. Hypogonadism  -testosterone  levels therapeutic  -Hemoglobin/hematocrit levels normal  -Continue testosterone  cypionate 200 mg/milliliters, 0.5 cc every 7 days  2. BPH with LUTS -PSA up to date, stable  -continue conservative management, avoiding bladder irritants and timed voiding's -Continue tadalafil  10 mg daily  3. Erectile dysfunction:    - continue tadalafil  10 mg daily  Return  in about 3 months (around 05/10/2024) for testosterone , PSA, hbg and hematocrit only .   These notes generated with voice recognition software. I apologize for typographical errors.  CLOTILDA HELON RIGGERS  [facility] [address]  [address] [place], [place] [address] [phone]

## 2024-02-17 ENCOUNTER — Other Ambulatory Visit: Payer: Self-pay | Admitting: Family Medicine

## 2024-02-21 DIAGNOSIS — M533 Sacrococcygeal disorders, not elsewhere classified: Secondary | ICD-10-CM | POA: Diagnosis not present

## 2024-02-23 ENCOUNTER — Encounter: Payer: Self-pay | Admitting: Family Medicine

## 2024-02-23 DIAGNOSIS — G43709 Chronic migraine without aura, not intractable, without status migrainosus: Secondary | ICD-10-CM

## 2024-02-23 MED ORDER — SUMATRIPTAN SUCCINATE 6 MG/0.5ML ~~LOC~~ SOAJ: 6.0000 mg | Freq: Every day | SUBCUTANEOUS | 5 refills | Status: DC | PRN

## 2024-02-23 NOTE — Telephone Encounter (Signed)
 Called [facility] at [phone]. Spoke w/ tech.Received 5 ml twice. Epic showing pt received 5ml on 12/19/23 and 01/17/24. She thinks qty dispensed got changed and this is reason for mix up. Asked we resend new prescription. E-scribed new rx to pharmacy.

## 2024-03-13 ENCOUNTER — Other Ambulatory Visit: Payer: Self-pay | Admitting: Family Medicine

## 2024-03-13 DIAGNOSIS — M533 Sacrococcygeal disorders, not elsewhere classified: Secondary | ICD-10-CM | POA: Diagnosis not present

## 2024-03-13 DIAGNOSIS — Z5181 Encounter for therapeutic drug level monitoring: Secondary | ICD-10-CM | POA: Diagnosis not present

## 2024-03-13 DIAGNOSIS — M545 Low back pain, unspecified: Secondary | ICD-10-CM | POA: Diagnosis not present

## 2024-03-13 DIAGNOSIS — M47816 Spondylosis without myelopathy or radiculopathy, lumbar region: Secondary | ICD-10-CM | POA: Diagnosis not present

## 2024-03-13 DIAGNOSIS — Z79899 Other long term (current) drug therapy: Secondary | ICD-10-CM | POA: Diagnosis not present

## 2024-03-13 DIAGNOSIS — M51369 Other intervertebral disc degeneration, lumbar region without mention of lumbar back pain or lower extremity pain: Secondary | ICD-10-CM | POA: Diagnosis not present

## 2024-03-15 ENCOUNTER — Encounter: Payer: Self-pay | Admitting: Family Medicine

## 2024-03-15 MED ORDER — ATORVASTATIN CALCIUM 10 MG PO TABS: 10.0000 mg | ORAL_TABLET | Freq: Every day | ORAL | 1 refills | Status: DC

## 2024-03-17 ENCOUNTER — Ambulatory Visit (INDEPENDENT_AMBULATORY_CARE_PROVIDER_SITE_OTHER): Payer: Medicare Other

## 2024-03-17 VITALS — Ht 66.0 in | Wt 179.0 lb

## 2024-03-17 DIAGNOSIS — Z0000 Encounter for general adult medical examination without abnormal findings: Secondary | ICD-10-CM | POA: Diagnosis not present

## 2024-03-17 NOTE — Progress Notes (Signed)
 Subjective:   Cody [name] is a 53 y.o. who presents for a Medicare Wellness preventive visit.  As a reminder, Annual Wellness Visits don't include a physical exam, and some assessments may be limited, especially if this visit is performed virtually. We may recommend an in-person follow-up visit with your provider if needed.  Visit Complete: Virtual I connected with  Cody [name] on 03/17/24 by a audio enabled telemedicine application and verified that I am speaking with the correct person using two identifiers.  Patient Location: Home  Provider Location: Office/Clinic  I discussed the limitations of evaluation and management by telemedicine. The patient expressed understanding and agreed to proceed.  Vital Signs: Because this visit was a virtual/telehealth visit, some criteria may be missing or patient reported. Any vitals not documented were not able to be obtained and vitals that have been documented are patient reported.  VideoDeclined- This patient declined Librarian, academic. Therefore the visit was completed with audio only.  Persons Participating in Visit: Patient.  AWV Questionnaire: No: Patient Medicare AWV questionnaire was not completed prior to this visit.  Cardiac Risk Factors include: advanced age (>59men, >29 women);dyslipidemia;hypertension;male gender;sedentary lifestyle     Objective:    Today's Vitals   03/17/24 1112  Weight: 179 lb (81.2 kg)  Height: 5' 6 (1.676 m)  PainSc: 4    Body mass index is 28.89 kg/m.     03/17/2024   11:33 AM 09/27/2023    1:00 PM 03/23/2023    1:20 PM 03/09/2023    2:37 PM 12/09/2022   10:02 AM 11/23/2022   10:56 AM 03/03/2022   11:57 AM  Advanced Directives  Does Patient Have a Medical Advance Directive? No No No No No No No  Type of Surveyor, minerals;Living will      Would patient like information on creating a medical advance directive?  No - Patient declined  No  - Patient declined No - Patient declined No - Patient declined No - Patient declined    Current Medications (verified) Outpatient Encounter Medications as of 03/17/2024  Medication Sig   ALPRAZolam  (XANAX ) 1 MG tablet PLEASE SEE ATTACHED FOR DETAILED DIRECTIONS   AMBULATORY NON FORMULARY MEDICATION by Intrathecal Infusion route daily. dilaudid  Pump Implant   atorvastatin  (LIPITOR) 10 MG tablet Take 1 tablet (10 mg total) by mouth daily.   busPIRone  (BUSPAR ) 10 MG tablet Take 1 tablet (10 mg total) by mouth 2 (two) times daily.   CAPLYTA  42 MG capsule Take 42 mg by mouth daily.   Cholecalciferol  (VITAMIN D ) 50 MCG (2000 UT) CAPS Take 1 capsule by mouth daily.   Erenumab -aooe 140 MG/ML SOAJ Inject 140 mg into the skin every 30 (thirty) days.   lamoTRIgine  (LAMICTAL ) 100 MG tablet Take 100 mg by mouth 2 (two) times daily.   levothyroxine  (SYNTHROID ) 75 MCG tablet TAKE 1 TABLET BY MOUTH DAILY  BEFORE BREAKFAST   NUCYNTA 100 MG TABS Take 1 tablet by mouth every 6 (six) hours as needed.   PAIN MANAGEMENT INTRATHECAL, IT, PUMP 1 each by Intrathecal route. dilaudid  pump   polyethylene glycol powder (GLYCOLAX /MIRALAX ) 17 GM/SCOOP powder Take 17 g by mouth every other day as needed.   REXULTI 1 MG TABS tablet Take by mouth.   sildenafil  (VIAGRA ) 100 MG tablet Take 1 tablet (100 mg total) by mouth daily as needed for erectile dysfunction.   SUMAtriptan  6 MG/0.5ML SOAJ Inject 6 mg into the skin daily as needed (  with 2nd dose 2 hours after 1st dose if needed.).   testosterone  cypionate (DEPOTESTOSTERONE CYPIONATE) 200 MG/ML injection Inject 0.5 cc every 7 days   tiZANidine  (ZANAFLEX ) 4 MG tablet Take 4 mg by mouth as needed.   topiramate  (TOPAMAX ) 50 MG tablet Take 50 mg by mouth daily.   vortioxetine  HBr (TRINTELLIX ) 10 MG TABS tablet Take 10 mg by mouth daily.   No facility-administered encounter medications on file as of 03/17/2024.    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; herniated disc L5- S1   Degenerative disk disease    Depression    Fracture, mandible (HCC)    Fracture, tibia    Headache    Hyperlipidemia    Hypertension    resolved   Hypothyroidism 05/2007   Dr Elsie Roses (retired)/ Dr Arlyss Solian   Neuromuscular disorder [facility])    PTSD (post-traumatic stress disorder)    Suicidal behavior    a.) ideations with attempt x 1   Past Surgical History:  Procedure Laterality Date   APPENDECTOMY     peritonitis   BACK SURGERY     EVALUATION UNDER ANESTHESIA WITH HEMORRHOIDECTOMY N/A 01/28/2022   Procedure: EXAM UNDER ANESTHESIA WITH HEMORRHOIDECTOMY;  Surgeon: Lane Shope, MD;  Location: [facility] ORS;  Service: General;  Laterality: N/A;   FRACTURE SURGERY     INTRATHECAL PUMP IMPLANTATION  2007   LAMINECTOMY  2004   arterial injury, transfused 7 units pc, implant surgery    MANDIBLE FRACTURE SURGERY     mugged   THORACIC DISCECTOMY     T10   UPPER GASTROINTESTINAL ENDOSCOPY  2006   gastritis   Family History  Problem Relation Age of Onset   Ulcers Mother    Depression Mother    Thyroid  disease Mother        hypo   Stroke Father 61   Thyroid  disease Father        hypo   Esophageal cancer Maternal Aunt    Lung cancer Maternal Grandmother    Thyroid  disease Maternal Grandmother        hypo   Colon cancer Maternal Grandfather    Heart attack Maternal Grandfather 55   Esophageal cancer Maternal Grandfather    Diabetes Neg Hx    Prostate cancer Neg Hx    Kidney cancer Neg Hx    Bladder Cancer Neg Hx    Migraines Neg Hx    Headache Neg Hx     Colon polyps Neg Hx    Rectal cancer Neg Hx    Stomach cancer Neg Hx    Social History   Socioeconomic History   Marital status: Married    Spouse name: Research scientist (physical sciences)   Number of children: 0   Years of education: Not on file   Highest education level: Not on file  Occupational History   Occupation: Disabled  Tobacco Use   Smoking status: Former    Current packs/day: 0.00    Types: E-cigarettes, Cigarettes    Quit date: 08/10/2009    Years since quitting:  14.6    Passive exposure: Never   Smokeless tobacco: Never   Tobacco comments:    smoked age intermittently age [age]; up to 2 cigarettes/ day   Vaping Use   Vaping status: Every Day   Substances: Nicotine , Flavoring  Substance and Sexual Activity   Alcohol use: No   Drug use: No   Sexual activity: Not on file  Other Topics Concern   Not on file  Social History Narrative   From [place] .     [address] Grad   To [place] 2000   Disability from bipolar affective disorder, prev accounting.     Married 1997   No kids   Enjoys walking, travel.     Social Drivers of Corporate investment banker Strain: Low Risk  (03/17/2024)   Overall Financial Resource Strain (CARDIA)    Difficulty of Paying Living Expenses: Not hard at all  Food Insecurity: No Food Insecurity (03/17/2024)   Hunger Vital Sign    Worried About Running Out of Food in the Last Year: Never true    Ran Out of Food in the Last Year: Never true  Transportation Needs: No Transportation Needs (03/17/2024)   PRAPARE - Administrator, Civil Service (Medical): No    Lack of Transportation (Non-Medical): No  Physical Activity: Inactive (03/17/2024)   Exercise Vital Sign    Days of Exercise per Week: 0 days    Minutes of Exercise per Session: 0 min  Stress: Stress Concern Present (03/17/2024)   [employer] of Occupational Health - Occupational Stress Questionnaire    Feeling of Stress: To some extent  Social Connections: Socially Isolated (03/17/2024)    Social Connection and Isolation Panel    Frequency of Communication with Friends and Family: Never    Frequency of Social Gatherings with Friends and Family: Never    Attends Religious Services: Never    Database administrator or Organizations: No    Attends Engineer, structural: Never    Marital Status: Married    Tobacco Counseling Counseling given: Not Answered Tobacco comments: smoked age intermittently age [age]; up to 2 cigarettes/ day     Clinical Intake:  Pre-visit preparation completed: Yes  Pain : 0-10 Pain Score: 4  Pain Type: Chronic pain Pain Location: Generalized Pain Radiating Towards: from back to legs Pain Descriptors / Indicators: Aching, Spasm, Tingling Pain Onset: More than a month ago Pain Frequency: Constant Pain Relieving Factors: pain medications via pump Effect of Pain on Daily Activities: limited with activities and walking  Pain Relieving Factors: pain medications via pump  BMI - recorded: 28.89 Nutritional Status: BMI 25 -29 Overweight Nutritional Risks: Nausea/ vomitting/ diarrhea (n/v w/migraine) Diabetes: No  Lab Results  Component Value Date   HGBA1C 5.6 07/23/2021   HGBA1C 6.1 (H) 02/25/2021   HGBA1C 5.6 02/19/2020     How often do you need to have someone help you when you read instructions, pamphlets, or other written materials from your doctor or pharmacy?: 1 - Never  Interpreter Needed?: No  Comments: lives with wife Information entered by :: B.Thresa Dozier,LPN   Activities of Daily Living     03/17/2024   11:34 AM  In your present state of health, do you have any difficulty performing the following activities:  Hearing? 0  Vision? 0  Difficulty concentrating or making decisions? 1  Walking or climbing stairs? 0  Dressing or bathing? 0  Doing errands, shopping? 0  Preparing Food and eating ? N  Using the Toilet? N  In the past six months, have you accidently leaked urine? N  Do you have problems with  loss of bowel control? N  Managing your Medications? N  Managing your Finances? N  Housekeeping or managing your Housekeeping? N    Patient Care Team: Cleatus Arlyss RAMAN, MD as PCP - General (Family Medicine) Chipper Viviane POUR, MD as Referring Physician (Psychiatry) Cornelius Lynwood HERO, MD as Referring Physician (Pain Medicine) Melanee Annah BROCKS, MD as Consulting Physician (Oncology)  I have updated your Care Teams any recent Medical Services you may have received from other providers in the past year.     Assessment:   This is a routine wellness examination for Cody [name].  Hearing/Vision screen Hearing Screening - Comments:: Pt says his hearing is good Vision Screening - Comments:: Pt says his vision is alright w/glasses Cannot remember eye md [place]    Goals Addressed             This Visit's Progress    DIET - EAT MORE FRUITS AND VEGETABLES   On track    COMPLETED: Increase physical activity       Starting 02/28/2018, I will continue to exercise 30-60 minutes daily.      Patient Stated   Not on track    Will try to get this donw: Start Physical therapy       Depression Screen     03/17/2024   11:27 AM 11/09/2023   12:48 PM 03/09/2023    2:33 PM 11/23/2022    1:17 PM 03/03/2022   11:54 AM 06/13/2020   12:37 PM 02/28/2018   12:30 PM  PHQ 2/9 Scores  PHQ - 2 Score 2 2 5  0 0 0 6  PHQ- 9 Score 7 12 12  1  14     Fall Risk     03/17/2024   11:21 AM 11/09/2023   12:47 PM 03/09/2023    2:18 PM 03/03/2022   11:57 AM 06/13/2020   12:37 PM  Fall Risk   Falls in the past year? 0 0 0 1 0  Number falls in past yr: 0 0 0 0 0  Injury with Fall? 0 0 0 0   Risk for fall due to : No Fall Risks No Fall Risks No Fall Risks History of fall(s)   Follow up Education provided;Falls prevention discussed Falls evaluation completed Falls evaluation completed;Education provided;Falls prevention discussed Falls prevention discussed  Falls evaluation completed      Data saved with a  previous flowsheet row definition    MEDICARE RISK AT HOME:  Medicare Risk at Home Any stairs in or around the home?: Yes If so, are there any without handrails?: Yes Home free of loose throw rugs in walkways, pet beds, electrical cords, etc?: Yes Adequate lighting in your home to reduce risk of falls?: Yes Life alert?: No Use of a cane, walker or w/c?: No Grab bars in the bathroom?: No Shower chair or bench in shower?: Yes Elevated toilet seat or a handicapped toilet?: No  TIMED UP AND GO:  Was the test performed?  No  Cognitive Function: 6CIT completed    02/28/2018   12:15 PM  MMSE - Mini Mental State Exam  Orientation to time 5  Orientation to Place 5  Registration 3  Attention/ Calculation 0  Recall 3  Language- name 2 objects 0  Language- repeat 1  Language- follow 3 step command 3  Language- read & follow direction 0  Write  a sentence 0  Copy design 0  Total score 20        03/17/2024   11:35 AM 03/09/2023    2:41 PM 03/03/2022   12:05 PM  6CIT Screen  What Year? 0 points 0 points 0 points  What month? 0 points 0 points 0 points  What time? 0 points 0 points 0 points  Count back from 20 0 points 0 points 0 points  Months in reverse 0 points 0 points 0 points  Repeat phrase 0 points 0 points 0 points  Total Score 0 points 0 points 0 points    Immunizations Immunization History  Administered Date(s) Administered   Influenza Whole 05/01/2010   Influenza, Seasonal, Injecte, Preservative Fre 05/15/2023   Influenza,inj,Quad PF,6+ Mos 04/25/2018, 06/24/2019, 05/18/2021   Influenza-Unspecified 05/14/2015, 05/24/2020   PFIZER(Purple Top)SARS-COV-2 Vaccination 11/08/2019, 11/28/2019, 07/10/2020    Screening Tests Health Maintenance  Topic Date Due   DTaP/Tdap/Td (1 - Tdap) Never done   Hepatitis B Vaccines (1 of 3 - 19+ 3-dose series) Never done   Zoster Vaccines- Shingrix (1 of 2) Never done   Pneumococcal Vaccine: 50+ Years (1 of 1 - PCV) Never done    COVID-19 Vaccine (4 - 2024-25 season) 04/11/2023   INFLUENZA VACCINE  03/10/2024   Medicare Annual Wellness (AWV)  03/17/2025   Colonoscopy  09/09/2032   Hepatitis C Screening  Completed   HIV Screening  Completed   HPV VACCINES  Aged Out   Meningococcal B Vaccine  Aged Out    Health Maintenance  Health Maintenance Due  Topic Date Due   DTaP/Tdap/Td (1 - Tdap) Never done   Hepatitis B Vaccines (1 of 3 - 19+ 3-dose series) Never done   Zoster Vaccines- Shingrix (1 of 2) Never done   Pneumococcal Vaccine: 50+ Years (1 of 1 - PCV) Never done   COVID-19 Vaccine (4 - 2024-25 season) 04/11/2023   INFLUENZA VACCINE  03/10/2024   Health Maintenance Items Addressed: None at this time  Additional Screening:  Vision Screening: Recommended annual ophthalmology exams for early detection of glaucoma and other disorders of the eye. Would you like a referral to an eye doctor? No    Dental Screening: Recommended annual dental exams for proper oral hygiene  Community Resource Referral / Chronic Care Management: CRR required this visit?  No   CCM required this visit?  Appt scheduled with PCP   Plan:    I have personally reviewed and noted the following in the patient's chart:   Medical and social history Use of alcohol, tobacco or illicit drugs  Current medications and supplements including opioid prescriptions. Patient is currently taking opioid prescriptions. Information provided to patient regarding non-opioid alternatives. Patient advised to discuss non-opioid treatment plan with their provider. Functional ability and status Nutritional status Physical activity Advanced directives List of other physicians Hospitalizations, surgeries, and ER visits in previous 12 months Vitals Screenings to include cognitive, depression, and falls Referrals and appointments  In addition, I have reviewed and discussed with patient certain preventive protocols, quality metrics, and best practice  recommendations. A written personalized care plan for preventive services as well as general preventive health recommendations were provided to patient.   Cody LITTIE Saris, LPN   08/10/7972   After Visit Summary: (MyChart) Due to this being a telephonic visit, the after visit summary with patients personalized plan was offered to patient via MyChart   Notes: Nothing significant to report at this time.

## 2024-03-17 NOTE — Patient Instructions (Signed)
 Cody [name] , Thank you for taking time out of your busy schedule to complete your Annual Wellness Visit with me. I enjoyed our conversation and look forward to speaking with you again next year. I, as well as your care team,  appreciate your ongoing commitment to your health goals. Please review the following plan we discussed and let me know if I can assist you in the future. Your Game plan/ To Do List    Referrals: If you haven't heard from the office you've been referred to, please reach out to them at the phone provided.   Follow up Visits: We will see or speak with you next year for your Next Medicare AWV with our clinical staff-03/20/25 @ 11:30am televisit Have you seen your provider in the last 6 months (3 months if uncontrolled diabetes)? Yes  Clinician Recommendations:  Aim for 30 minutes of exercise or brisk walking, 6-8 glasses of water, and 5 servings of fruits and vegetables each day.       This is a list of the screenings recommended for you:  Health Maintenance  Topic Date Due   DTaP/Tdap/Td vaccine (1 - Tdap) Never done   Hepatitis B Vaccine (1 of 3 - 19+ 3-dose series) Never done   Zoster (Shingles) Vaccine (1 of 2) Never done   Pneumococcal Vaccine for age over 43 (1 of 1 - PCV) Never done   COVID-19 Vaccine (4 - 2024-25 season) 04/11/2023   Flu Shot  03/10/2024   Medicare Annual Wellness Visit  03/17/2025   Colon Cancer Screening  09/09/2032   Hepatitis C Screening  Completed   HIV Screening  Completed   HPV Vaccine  Aged Out   Meningitis B Vaccine  Aged Out    Advanced directives: (Declined) Advance directive discussed with you today. Even though you declined this today, please call our office should you change your mind, and we can give you the proper paperwork for you to fill out. Advance Care Planning is important because it:  [x]  Makes sure you receive the medical care that is consistent with your values, goals, and preferences  [x]  It provides guidance to your  family and loved ones and reduces their decisional burden about whether or not they are making the right decisions based on your wishes.  Follow the link provided in your after visit summary or read over the paperwork we have mailed to you to help you started getting your Advance Directives in place. If you need assistance in completing these, please reach out to us  so that we can help you!

## 2024-03-27 ENCOUNTER — Encounter: Payer: Self-pay | Admitting: Oncology

## 2024-03-27 ENCOUNTER — Inpatient Hospital Stay: Payer: Medicare Other | Attending: Oncology | Admitting: Oncology

## 2024-03-27 VITALS — BP 139/92 | HR 107 | Temp 98.6°F | Resp 18 | Ht 66.0 in | Wt 206.3 lb

## 2024-03-27 DIAGNOSIS — D751 Secondary polycythemia: Secondary | ICD-10-CM | POA: Diagnosis present

## 2024-03-27 NOTE — Progress Notes (Signed)
 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  He had urine analysis done February 2024 which did not show any evidence of hematuria.  He is on testosterone  supplementation for hypogonadism through urology.  Testosterone  levels on 11/17/2022 were low at 126 after they were elevated at 1498 in December 2023.   Patient does not smoke cigarettes but does vape.  He was told that he has obstructive sleep apnea in the past but he did not use CPAP.  Currently he reports sleeping well.  He is on testosterone  supplementation for the last 1 year or so.  This is being given for opioid-induced hypogonadism.   Results of workup fromApril 2024 showed negative JAK2 mutation and normal EPO levels.  Polycythemia likely secondary due to testosterone  replacement therapy.  Target hematocrit less than 50  Interval history-patient is presently doing well on a stable dose of testosterone  which is being administered by urology.  He has not required phlebotomy recently  ECOG PS- 1 Pain scale-  0   Review of systems- Review of Systems  Constitutional:  Positive for malaise/fatigue. Negative for chills, fever and weight loss.  HENT:  Negative for congestion, ear discharge and nosebleeds.   Eyes:  Negative for blurred vision.  Respiratory:  Negative for cough, hemoptysis, sputum production, shortness of breath and wheezing.   Cardiovascular:  Negative for chest pain, palpitations, orthopnea and claudication.  Gastrointestinal:  Negative for abdominal pain, blood in stool, constipation, diarrhea, heartburn, melena, nausea and vomiting.  Genitourinary:  Negative for dysuria, flank pain, frequency, hematuria and urgency.  Musculoskeletal:  Negative for back pain, joint pain and myalgias.  Skin:  Negative for rash.  Neurological:  Negative for dizziness, tingling, focal weakness, seizures, weakness and headaches.  Endo/Heme/Allergies:  Does not bruise/bleed easily.  Psychiatric/Behavioral:  Negative for depression and suicidal ideas. The patient does not have insomnia.       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; herniated disc L5- S1   Degenerative disk disease  Depression    Fracture, mandible (HCC)    Fracture, tibia    Headache    Hyperlipidemia    Hypertension    resolved   Hypothyroidism 05/2007   Dr Elsie Roses (retired)/ Dr Arlyss Solian   Neuromuscular disorder [facility])    PTSD (post-traumatic stress disorder)    Suicidal behavior    a.) ideations with attempt x 1      Past Surgical History:  Procedure Laterality Date   APPENDECTOMY     peritonitis   BACK SURGERY     EVALUATION UNDER ANESTHESIA WITH HEMORRHOIDECTOMY N/A 01/28/2022   Procedure: EXAM UNDER ANESTHESIA WITH HEMORRHOIDECTOMY;  Surgeon: Lane Shope, MD;  Location: [facility] ORS;  Service: General;  Laterality: N/A;   FRACTURE SURGERY     INTRATHECAL PUMP IMPLANTATION  2007   LAMINECTOMY  2004   arterial injury, transfused 7 units pc, implant surgery    MANDIBLE FRACTURE SURGERY     mugged   THORACIC DISCECTOMY     T10   UPPER GASTROINTESTINAL ENDOSCOPY  2006   gastritis    Social History   Socioeconomic History   Marital status: Married    Spouse name: Research scientist (physical sciences)   Number of children: 0   Years of education: Not on file   Highest education level: Not on file  Occupational History   Occupation: Disabled  Tobacco Use   Smoking status: Former    Current packs/day: 0.00    Types: E-cigarettes, Cigarettes    Quit date: 08/10/2009    Years since quitting: 14.6    Passive exposure: Never   Smokeless tobacco: Never   Tobacco comments:    smoked age intermittently age [age]; up to 2 cigarettes/ day   Vaping Use   Vaping status: Every Day   Substances: Nicotine , Flavoring  Substance and Sexual Activity   Alcohol use: No   Drug use: No   Sexual activity: Not on file  Other Topics Concern   Not on file  Social History Narrative   From [place] .     [address] Grad   To [place] 2000   Disability from bipolar affective disorder, prev accounting.     Married 1997   No kids   Enjoys walking, travel.     Social Drivers of Corporate investment banker Strain: Low Risk  (03/17/2024)   Overall Financial Resource Strain (CARDIA)    Difficulty of Paying Living Expenses: Not hard at all  Food Insecurity: No Food Insecurity (03/17/2024)   Hunger Vital Sign    Worried About Running Out of Food in the Last Year: Never true    Ran Out of Food in the Last Year: Never true   Transportation Needs: No Transportation Needs (03/17/2024)   PRAPARE - Administrator, Civil Service (Medical): No    Lack of Transportation (Non-Medical): No  Physical Activity: Inactive (03/17/2024)   Exercise Vital Sign    Days of Exercise per Week: 0 days    Minutes of Exercise per Session: 0 min  Stress: Stress Concern Present (03/17/2024)   [employer] of Occupational Health - Occupational Stress Questionnaire    Feeling of Stress: To some extent  Social Connections: Socially Isolated (03/17/2024)   Social Connection and Isolation Panel    Frequency of Communication with Friends and Family: Never    Frequency of Social Gatherings with Friends and Family: Never    Attends Religious Services: Never    Database administrator or Organizations: No  Attends Banker Meetings: Never    Marital Status: Married  Catering manager Violence: Not At Risk (03/17/2024)   Humiliation, Afraid, Rape, and Kick questionnaire    Fear of Current or Ex-Partner: No    Emotionally Abused: No    Physically Abused: No    Sexually Abused: No    Family History  Problem Relation Age of Onset   Ulcers Mother    Depression Mother    Thyroid  disease Mother        hypo   Stroke Father 40   Thyroid  disease Father        hypo   Esophageal cancer Maternal Aunt    Lung cancer Maternal Grandmother    Thyroid  disease Maternal Grandmother        hypo   Colon cancer Maternal Grandfather    Heart attack Maternal Grandfather 55   Esophageal cancer Maternal Grandfather    Diabetes Neg Hx    Prostate cancer Neg Hx    Kidney cancer Neg Hx    Bladder Cancer Neg Hx    Migraines Neg Hx    Headache Neg Hx    Colon polyps Neg Hx    Rectal cancer Neg Hx    Stomach cancer Neg Hx      Current Outpatient Medications:    ALPRAZolam  (XANAX ) 1 MG tablet, PLEASE SEE ATTACHED FOR DETAILED DIRECTIONS, Disp: , Rfl:    AMBULATORY NON FORMULARY MEDICATION, by Intrathecal Infusion route daily.  dilaudid  Pump Implant, Disp: , Rfl:    atorvastatin  (LIPITOR) 10 MG tablet, Take 1 tablet (10 mg total) by mouth daily., Disp: 90 tablet, Rfl: 1   busPIRone  (BUSPAR ) 10 MG tablet, Take 1 tablet (10 mg total) by mouth 2 (two) times daily., Disp: , Rfl:    CAPLYTA  42 MG capsule, Take 42 mg by mouth daily., Disp: , Rfl:    Cholecalciferol  (VITAMIN D ) 50 MCG (2000 UT) CAPS, Take 1 capsule by mouth daily., Disp: , Rfl:    Erenumab -aooe 140 MG/ML SOAJ, Inject 140 mg into the skin every 30 (thirty) days., Disp: 3 mL, Rfl: 3   lamoTRIgine  (LAMICTAL ) 100 MG tablet, Take 100 mg by mouth 2 (two) times daily., Disp: , Rfl:    levothyroxine  (SYNTHROID ) 75 MCG tablet, TAKE 1 TABLET BY MOUTH DAILY  BEFORE BREAKFAST, Disp: 90 tablet, Rfl: 3   NUCYNTA 100 MG TABS, Take 1 tablet by mouth every 6 (six) hours as needed., Disp: , Rfl:    PAIN MANAGEMENT INTRATHECAL, IT, PUMP, 1 each by Intrathecal route. dilaudid  pump, Disp: , Rfl:    polyethylene glycol powder (GLYCOLAX /MIRALAX ) 17 GM/SCOOP powder, Take 17 g by mouth every other day as needed., Disp: , Rfl:    REXULTI 1 MG TABS tablet, Take by mouth., Disp: , Rfl:    sildenafil  (VIAGRA ) 100 MG tablet, Take 1 tablet (100 mg total) by mouth daily as needed for erectile dysfunction., Disp: 30 tablet, Rfl: 3   SUMAtriptan  6 MG/0.5ML SOAJ, Inject 6 mg into the skin daily as needed (with 2nd dose 2 hours after 1st dose if needed.)., Disp: 5 mL, Rfl: 5   tadalafil  (CIALIS ) 10 MG tablet, Take 10 mg by mouth daily as needed., Disp: , Rfl:    testosterone  cypionate (DEPOTESTOSTERONE CYPIONATE) 200 MG/ML injection, Inject 0.5 cc every 7 days, Disp: 10 mL, Rfl: 0   tiZANidine  (ZANAFLEX ) 4 MG tablet, Take 4 mg by mouth as needed., Disp: , Rfl:    topiramate  (TOPAMAX ) 50 MG tablet, Take 50  mg by mouth daily., Disp: , Rfl:    vortioxetine  HBr (TRINTELLIX ) 10 MG TABS tablet, Take 10 mg by mouth daily., Disp: , Rfl:   Physical exam:  Vitals:   03/27/24 1309  BP: (!) 139/92   Pulse: (!) 107  Resp: 18  Temp: 98.6 F (37 C)  TempSrc: Tympanic  SpO2: 96%  Weight: 206 lb 4.8 oz (93.6 kg)  Height: 5' 6 (1.676 m)   Physical Exam Cardiovascular:     Rate and Rhythm: Normal rate and regular rhythm.     Heart sounds: Normal heart sounds.  Pulmonary:     Effort: Pulmonary effort is normal.     Breath sounds: Normal breath sounds.  Skin:    General: Skin is warm and dry.  Neurological:     Mental Status: He is alert and oriented to person, place, and time.      I have personally reviewed labs listed below:    Latest Ref Rng & Units 11/09/2023    1:39 PM  CMP  Glucose 70 - 99 mg/dL 889   BUN 6 - 24 mg/dL 8   Creatinine 9.23 - 8.72 mg/dL 8.50   Sodium 865 - 855 mmol/L 144   Potassium 3.5 - 5.2 mmol/L 4.5   Chloride 96 - 106 mmol/L 106   CO2 20 - 29 mmol/L 25   Calcium  8.7 - 10.2 mg/dL 9.5   Total Protein 6.0 - 8.5 g/dL 6.7   Total Bilirubin 0.0 - 1.2 mg/dL 0.3   Alkaline Phos 44 - 121 IU/L 85   AST 0 - 40 IU/L 33   ALT 0 - 44 IU/L 47       Latest Ref Rng & Units 01/19/2024    9:57 AM  CBC  Hemoglobin 13.0 - 17.7 g/dL 85.3   Hematocrit 62.4 - 51.0 % 47.5      Assessment and plan- Patient is a 53 y.o. male here for a routine follow-up of secondary polycythemia  Patient states that he had blood work done last week which I do not have with me today.  He typically gets his labs done through [facility].  Last H&H Was checked in June 2025 from our system and it was 14.6/47.5 and based on those levels he does not require phlebotomy at this time.  Labs to be done in [facility] in 3 months and 6 months and I will see him back in 6 months   Visit Diagnosis 1. Polycythemia, secondary      Dr. Annah Skene, MD, MPH [facility] [phone] 03/27/2024 4:33 PM

## 2024-04-13 ENCOUNTER — Other Ambulatory Visit: Payer: Self-pay | Admitting: Family Medicine

## 2024-04-14 DIAGNOSIS — F603 Borderline personality disorder: Secondary | ICD-10-CM | POA: Diagnosis not present

## 2024-04-14 DIAGNOSIS — F4312 Post-traumatic stress disorder, chronic: Secondary | ICD-10-CM | POA: Diagnosis not present

## 2024-04-14 DIAGNOSIS — F411 Generalized anxiety disorder: Secondary | ICD-10-CM | POA: Diagnosis not present

## 2024-04-14 DIAGNOSIS — F410 Panic disorder [episodic paroxysmal anxiety] without agoraphobia: Secondary | ICD-10-CM | POA: Diagnosis not present

## 2024-05-09 ENCOUNTER — Other Ambulatory Visit: Payer: Self-pay

## 2024-05-09 DIAGNOSIS — E291 Testicular hypofunction: Secondary | ICD-10-CM

## 2024-05-09 DIAGNOSIS — N138 Other obstructive and reflux uropathy: Secondary | ICD-10-CM

## 2024-05-11 ENCOUNTER — Other Ambulatory Visit: Payer: Self-pay | Admitting: Family Medicine

## 2024-05-12 ENCOUNTER — Encounter: Payer: Self-pay | Admitting: Family Medicine

## 2024-05-12 NOTE — Telephone Encounter (Signed)
 I am showing that this medication was discontinued by urology Clotilda Cornwall PA

## 2024-05-12 NOTE — Telephone Encounter (Signed)
 This was prev stopped by urology.  If it doesn't cause sedation or make his urinary symptoms worse, then he could use as needed.  Rx sent. Thanks.

## 2024-05-14 NOTE — Telephone Encounter (Signed)
 Prescription previously sent on 05/12/2024.

## 2024-05-15 ENCOUNTER — Other Ambulatory Visit

## 2024-05-18 ENCOUNTER — Telehealth: Payer: Self-pay | Admitting: *Deleted

## 2024-05-18 ENCOUNTER — Other Ambulatory Visit

## 2024-05-18 ENCOUNTER — Encounter: Payer: Self-pay | Admitting: Oncology

## 2024-05-18 ENCOUNTER — Encounter: Payer: Self-pay | Admitting: Family Medicine

## 2024-05-18 ENCOUNTER — Other Ambulatory Visit (HOSPITAL_COMMUNITY): Payer: Self-pay

## 2024-05-18 DIAGNOSIS — E291 Testicular hypofunction: Secondary | ICD-10-CM | POA: Diagnosis not present

## 2024-05-18 DIAGNOSIS — N401 Enlarged prostate with lower urinary tract symptoms: Secondary | ICD-10-CM | POA: Diagnosis not present

## 2024-05-18 DIAGNOSIS — N138 Other obstructive and reflux uropathy: Secondary | ICD-10-CM | POA: Diagnosis not present

## 2024-05-18 NOTE — Telephone Encounter (Signed)
 Pt said PA is needed for  Aimovig .

## 2024-05-18 NOTE — Telephone Encounter (Signed)
 Pharmacy Patient Advocate Encounter   Received notification from Physician's Office that prior authorization for Aimovig  140mg /ml autoinjector is required/requested.   Insurance verification completed.   The patient is insured through [facility].   Per test claim: PA required; PA submitted to above mentioned insurance via Latent Key/confirmation #/EOC [facility] Status is pending

## 2024-05-18 NOTE — Telephone Encounter (Signed)
 Pharmacy Patient Advocate Encounter  Received notification from [employer] that Prior Authorization for Aimovig  has been APPROVED from 05/18/2024 to 05/18/2026   PA #/Case ID/Reference #: [id number]  I could not run test claim because I have missing information for the patients pharmacy benefits-we only have his medical card info loaded into the chart.

## 2024-05-19 LAB — TESTOSTERONE: Testosterone: 1131 ng/dL — ABNORMAL HIGH (ref 264–916)

## 2024-05-19 LAB — HEMOGLOBIN AND HEMATOCRIT, BLOOD
Hematocrit: 51.6 % — ABNORMAL HIGH (ref 37.5–51.0)
Hemoglobin: 15.9 g/dL (ref 13.0–17.7)

## 2024-05-19 LAB — PSA: Prostate Specific Ag, Serum: 1.5 ng/mL (ref 0.0–4.0)

## 2024-05-21 ENCOUNTER — Ambulatory Visit: Payer: Self-pay | Admitting: Urology

## 2024-05-22 ENCOUNTER — Other Ambulatory Visit: Payer: Self-pay | Admitting: Urology

## 2024-05-22 DIAGNOSIS — E291 Testicular hypofunction: Secondary | ICD-10-CM

## 2024-05-22 MED ORDER — TESTOSTERONE CYPIONATE 200 MG/ML IM SOLN
INTRAMUSCULAR | 0 refills | Status: AC
Start: 2024-05-22 — End: ?

## 2024-05-23 ENCOUNTER — Encounter: Admitting: Family Medicine

## 2024-05-23 DIAGNOSIS — M47816 Spondylosis without myelopathy or radiculopathy, lumbar region: Secondary | ICD-10-CM | POA: Diagnosis not present

## 2024-05-23 DIAGNOSIS — M51369 Other intervertebral disc degeneration, lumbar region without mention of lumbar back pain or lower extremity pain: Secondary | ICD-10-CM | POA: Diagnosis not present

## 2024-05-23 DIAGNOSIS — M545 Low back pain, unspecified: Secondary | ICD-10-CM | POA: Diagnosis not present

## 2024-05-23 DIAGNOSIS — M533 Sacrococcygeal disorders, not elsewhere classified: Secondary | ICD-10-CM | POA: Diagnosis not present

## 2024-06-05 DIAGNOSIS — M533 Sacrococcygeal disorders, not elsewhere classified: Secondary | ICD-10-CM | POA: Diagnosis not present

## 2024-06-13 ENCOUNTER — Ambulatory Visit (INDEPENDENT_AMBULATORY_CARE_PROVIDER_SITE_OTHER): Admitting: Family Medicine

## 2024-06-13 ENCOUNTER — Encounter: Payer: Self-pay | Admitting: Family Medicine

## 2024-06-13 VITALS — BP 124/78 | HR 88 | Temp 98.0°F | Ht 66.0 in | Wt 198.8 lb

## 2024-06-13 DIAGNOSIS — Z0000 Encounter for general adult medical examination without abnormal findings: Secondary | ICD-10-CM

## 2024-06-13 DIAGNOSIS — R739 Hyperglycemia, unspecified: Secondary | ICD-10-CM

## 2024-06-13 DIAGNOSIS — Z7189 Other specified counseling: Secondary | ICD-10-CM

## 2024-06-13 DIAGNOSIS — E038 Other specified hypothyroidism: Secondary | ICD-10-CM | POA: Diagnosis not present

## 2024-06-13 DIAGNOSIS — Z8639 Personal history of other endocrine, nutritional and metabolic disease: Secondary | ICD-10-CM

## 2024-06-13 DIAGNOSIS — Z23 Encounter for immunization: Secondary | ICD-10-CM | POA: Diagnosis not present

## 2024-06-13 DIAGNOSIS — G894 Chronic pain syndrome: Secondary | ICD-10-CM

## 2024-06-13 DIAGNOSIS — F3170 Bipolar disorder, currently in remission, most recent episode unspecified: Secondary | ICD-10-CM

## 2024-06-13 DIAGNOSIS — E785 Hyperlipidemia, unspecified: Secondary | ICD-10-CM

## 2024-06-13 DIAGNOSIS — R7989 Other specified abnormal findings of blood chemistry: Secondary | ICD-10-CM

## 2024-06-13 MED ORDER — LEVOTHYROXINE SODIUM 75 MCG PO TABS: 75.0000 ug | ORAL_TABLET | Freq: Every day | ORAL | 3 refills | Status: DC

## 2024-06-13 NOTE — Patient Instructions (Signed)
 Go to the lab on the way out.   If you have mychart we'll likely use that to update you.    Take care.  Glad to see you. Flu shot today.

## 2024-06-13 NOTE — Progress Notes (Unsigned)
 Tetanus d/w pt.   Flu 2025 Covid prev done  PNA d/w pt.   Shingles d/w pt.   Colonoscopy 2024 PSA wnl 2025   Advance directive-wife designated patient were incapacitated.  He recent had covid, a few weeks ago, sx are better in the meantime.  Had URI sx and a fever.    Chronic pain per outside clinic.  Nucynta helped.  Still on pump at baseline.   H/o hyperglycemia.  A1c pending.    Mood/rx per outside clinic.  Mood improved with pain control.  Pain control and improved migraine frequency helped his mood.  No SI/HI.  I'm in a good place.    Elevated Cholesterol: Using medications without problems: yes Muscle aches: likely not from statin.  Diet compliance: d/w pt.   Exercise: limited by pain.  Controlled on most recent check.     Testosterone  replacement per outside clinic.     Hypothyroidism.  TSH pending.  Compliant.  No neck mass.    PMH and SH reviewed  Meds, vitals, and allergies reviewed.   ROS: Per HPI.  Unless specifically indicated otherwise in HPI, the patient denies:  General: fever. Eyes: acute vision changes Scheidt: sore throat Cardiovascular: chest pain Respiratory: SOB GI: vomiting GU: dysuria Musculoskeletal: acute back pain Derm: acute rash Neuro: acute motor dysfunction Psych: worsening mood Endocrine: polydipsia Heme: bleeding Allergy: hayfever  GEN: nad, alert and oriented HEENT: mucous membranes moist NECK: supple w/o LA CV: rrr. PULM: ctab, no inc wob ABD: soft, +bs EXT: no edema SKIN: no acute rash

## 2024-06-14 ENCOUNTER — Ambulatory Visit: Payer: Self-pay | Admitting: Family Medicine

## 2024-06-14 DIAGNOSIS — R739 Hyperglycemia, unspecified: Secondary | ICD-10-CM | POA: Insufficient documentation

## 2024-06-14 LAB — HEMOGLOBIN A1C
Est. average glucose Bld gHb Est-mCnc: 128 mg/dL
Hgb A1c MFr Bld: 6.1 % — ABNORMAL HIGH (ref 4.8–5.6)

## 2024-06-14 LAB — COMPREHENSIVE METABOLIC PANEL WITH GFR
ALT: 51 IU/L — ABNORMAL HIGH (ref 0–44)
AST: 42 IU/L — ABNORMAL HIGH (ref 0–40)
Albumin: 4.6 g/dL (ref 3.8–4.9)
Alkaline Phosphatase: 88 IU/L (ref 47–123)
BUN/Creatinine Ratio: 5 — ABNORMAL LOW (ref 9–20)
BUN: 8 mg/dL (ref 6–24)
Bilirubin Total: 0.3 mg/dL (ref 0.0–1.2)
CO2: 20 mmol/L (ref 20–29)
Calcium: 9.6 mg/dL (ref 8.7–10.2)
Chloride: 106 mmol/L (ref 96–106)
Creatinine, Ser: 1.49 mg/dL — ABNORMAL HIGH (ref 0.76–1.27)
Globulin, Total: 2.7 g/dL (ref 1.5–4.5)
Glucose: 108 mg/dL — ABNORMAL HIGH (ref 70–99)
Potassium: 4.5 mmol/L (ref 3.5–5.2)
Sodium: 146 mmol/L — ABNORMAL HIGH (ref 134–144)
Total Protein: 7.3 g/dL (ref 6.0–8.5)
eGFR: 56 mL/min/1.73 — ABNORMAL LOW (ref >59)

## 2024-06-14 LAB — TSH: TSH: 1.38 u[IU]/mL (ref 0.450–4.500)

## 2024-06-14 LAB — VITAMIN D 25 HYDROXY (VIT D DEFICIENCY, FRACTURES): Vit D, 25-Hydroxy: 45.2 ng/mL (ref 30.0–100.0)

## 2024-06-14 NOTE — Assessment & Plan Note (Signed)
 Replacement per outside clinic.  I will defer.

## 2024-06-14 NOTE — Assessment & Plan Note (Signed)
TSH pending.  Continue levothyroxine.  See notes on labs.

## 2024-06-14 NOTE — Assessment & Plan Note (Signed)
 Chronic pain per outside clinic.  Nucynta helped.  Still using opiate pump at baseline.

## 2024-06-14 NOTE — Assessment & Plan Note (Signed)
 Tetanus d/w pt.   Flu 2025 Covid prev done  PNA d/w pt.   Shingles d/w pt.   Colonoscopy 2024 PSA wnl 2025   Advance directive-wife designated patient were incapacitated.

## 2024-06-14 NOTE — Assessment & Plan Note (Signed)
 Lipids controlled on most recent check.   Continue atorvastatin .

## 2024-06-14 NOTE — Assessment & Plan Note (Signed)
 See above.  Okay for outpatient follow-up.  Per outside clinic.

## 2024-06-14 NOTE — Assessment & Plan Note (Signed)
Wife designated patient were incapacitated.

## 2024-06-14 NOTE — Assessment & Plan Note (Signed)
 See notes on labs.

## 2024-06-27 ENCOUNTER — Other Ambulatory Visit: Payer: Self-pay | Admitting: Family Medicine

## 2024-06-27 DIAGNOSIS — E785 Hyperlipidemia, unspecified: Secondary | ICD-10-CM

## 2024-07-04 ENCOUNTER — Other Ambulatory Visit: Payer: Self-pay | Admitting: Family Medicine

## 2024-07-04 DIAGNOSIS — F410 Panic disorder [episodic paroxysmal anxiety] without agoraphobia: Secondary | ICD-10-CM | POA: Diagnosis not present

## 2024-07-04 DIAGNOSIS — F4312 Post-traumatic stress disorder, chronic: Secondary | ICD-10-CM | POA: Diagnosis not present

## 2024-07-04 DIAGNOSIS — F603 Borderline personality disorder: Secondary | ICD-10-CM | POA: Diagnosis not present

## 2024-07-04 DIAGNOSIS — F332 Major depressive disorder, recurrent severe without psychotic features: Secondary | ICD-10-CM | POA: Diagnosis not present

## 2024-07-07 ENCOUNTER — Telehealth: Payer: Self-pay | Admitting: Urology

## 2024-07-07 NOTE — Telephone Encounter (Signed)
 Would you call Cody [name] and get him scheduled for repeat testosterone  level (3 to 4 days after an injection), hemoglobin, and hematocrit the second week of January?

## 2024-07-11 ENCOUNTER — Encounter: Payer: Self-pay | Admitting: Family Medicine

## 2024-07-11 ENCOUNTER — Other Ambulatory Visit: Payer: Self-pay | Admitting: Family Medicine

## 2024-07-11 DIAGNOSIS — E038 Other specified hypothyroidism: Secondary | ICD-10-CM

## 2024-07-11 MED ORDER — LEVOTHYROXINE SODIUM 75 MCG PO TABS: 75.0000 ug | ORAL_TABLET | Freq: Every day | ORAL | 3 refills | Status: AC

## 2024-07-11 NOTE — Telephone Encounter (Signed)
E-scribed rx to [facility], per pt request.

## 2024-07-28 DIAGNOSIS — M545 Low back pain, unspecified: Secondary | ICD-10-CM | POA: Diagnosis not present

## 2024-07-28 DIAGNOSIS — G894 Chronic pain syndrome: Secondary | ICD-10-CM | POA: Diagnosis not present

## 2024-07-28 DIAGNOSIS — Z978 Presence of other specified devices: Secondary | ICD-10-CM | POA: Diagnosis not present

## 2024-08-17 ENCOUNTER — Other Ambulatory Visit: Payer: Self-pay | Admitting: Family Medicine

## 2024-08-18 ENCOUNTER — Other Ambulatory Visit: Payer: Self-pay

## 2024-08-18 ENCOUNTER — Other Ambulatory Visit

## 2024-08-18 DIAGNOSIS — E291 Testicular hypofunction: Secondary | ICD-10-CM

## 2024-08-18 NOTE — Telephone Encounter (Signed)
 Called patient states he does not need at this time. Will decline at this time,.

## 2024-08-21 ENCOUNTER — Other Ambulatory Visit

## 2024-08-21 DIAGNOSIS — E291 Testicular hypofunction: Secondary | ICD-10-CM

## 2024-08-22 ENCOUNTER — Ambulatory Visit: Payer: Self-pay | Admitting: Urology

## 2024-08-22 DIAGNOSIS — E291 Testicular hypofunction: Secondary | ICD-10-CM

## 2024-08-22 LAB — HEMOGLOBIN AND HEMATOCRIT, BLOOD
Hematocrit: 54.4 % — ABNORMAL HIGH (ref 37.5–51.0)
Hemoglobin: 16.8 g/dL (ref 13.0–17.7)

## 2024-08-22 LAB — TESTOSTERONE: Testosterone: 1488 ng/dL — ABNORMAL HIGH (ref 264–916)

## 2024-08-25 ENCOUNTER — Telehealth: Payer: Self-pay | Admitting: Oncology

## 2024-08-25 DIAGNOSIS — D751 Secondary polycythemia: Secondary | ICD-10-CM

## 2024-08-25 NOTE — Telephone Encounter (Signed)
 Patient wife called and stated he had labs completed with his PCP and he needs to have a phlebotomy. Please advise on scheduling.   Thank you Lyle

## 2024-08-25 NOTE — Telephone Encounter (Signed)
 Recent labs showed hematocrit of 54.  He needs 1 session of phlebotomy.  Repeat H&H and possible phlebotomy in 2 weeks

## 2024-08-28 NOTE — Addendum Note (Signed)
 Addended by: Jamiya Nims on: 08/28/2024 10:27 AM   Modules accepted: Orders

## 2024-08-29 ENCOUNTER — Inpatient Hospital Stay

## 2024-08-29 ENCOUNTER — Inpatient Hospital Stay: Attending: Oncology

## 2024-08-29 ENCOUNTER — Other Ambulatory Visit: Payer: Self-pay

## 2024-08-29 VITALS — BP 125/88 | HR 93 | Temp 97.8°F | Resp 18

## 2024-08-29 DIAGNOSIS — G43709 Chronic migraine without aura, not intractable, without status migrainosus: Secondary | ICD-10-CM

## 2024-08-29 DIAGNOSIS — D751 Secondary polycythemia: Secondary | ICD-10-CM | POA: Diagnosis present

## 2024-08-29 MED ORDER — SUMATRIPTAN SUCCINATE 6 MG/0.5ML ~~LOC~~ SOAJ: 6.0000 mg | Freq: Every day | SUBCUTANEOUS | 4 refills | Status: AC | PRN

## 2024-08-31 NOTE — Telephone Encounter (Signed)
 Scheduled patient a lab appointment for 09/08/24 @ 10am. Patient verbalized understanding of information given.  Andrea Kirks LPN

## 2024-09-08 ENCOUNTER — Encounter: Payer: Self-pay | Admitting: Oncology

## 2024-09-08 ENCOUNTER — Other Ambulatory Visit

## 2024-09-08 DIAGNOSIS — E291 Testicular hypofunction: Secondary | ICD-10-CM

## 2024-09-09 LAB — HEMOGLOBIN AND HEMATOCRIT, BLOOD
Hematocrit: 51.5 % — ABNORMAL HIGH (ref 37.5–51.0)
Hemoglobin: 15.7 g/dL (ref 13.0–17.7)

## 2024-09-10 ENCOUNTER — Ambulatory Visit: Payer: Self-pay | Admitting: Urology

## 2024-09-12 ENCOUNTER — Inpatient Hospital Stay

## 2024-09-13 ENCOUNTER — Inpatient Hospital Stay: Attending: Oncology

## 2024-09-13 ENCOUNTER — Inpatient Hospital Stay

## 2024-09-13 DIAGNOSIS — D751 Secondary polycythemia: Secondary | ICD-10-CM

## 2024-09-13 LAB — HEMOGLOBIN AND HEMATOCRIT (CANCER CENTER ONLY)
HCT: 45.8 % (ref 39.0–52.0)
Hemoglobin: 14.5 g/dL (ref 13.0–17.0)

## 2024-09-13 NOTE — Progress Notes (Signed)
 No phlebotomy Today Hct 45.8 threshold is HCt above 50

## 2024-09-25 ENCOUNTER — Ambulatory Visit: Admitting: Oncology

## 2024-11-28 ENCOUNTER — Ambulatory Visit: Admitting: Family Medicine

## 2025-03-19 ENCOUNTER — Ambulatory Visit
